# Patient Record
Sex: Male | Born: 1975 | Race: White | Hispanic: No | Smoking: Current some day smoker
Health system: Southern US, Community
[De-identification: ages and names within clinical notes are randomized; demographics above are authoritative.]

## PROBLEM LIST (undated history)

## (undated) DIAGNOSIS — R55 Syncope and collapse: Secondary | ICD-10-CM

## (undated) DIAGNOSIS — J45909 Unspecified asthma, uncomplicated: Secondary | ICD-10-CM

## (undated) DIAGNOSIS — J40 Bronchitis, not specified as acute or chronic: Secondary | ICD-10-CM

## (undated) DIAGNOSIS — F32A Depression, unspecified: Secondary | ICD-10-CM

## (undated) DIAGNOSIS — C6290 Malignant neoplasm of unspecified testis, unspecified whether descended or undescended: Secondary | ICD-10-CM

## (undated) DIAGNOSIS — C801 Malignant (primary) neoplasm, unspecified: Secondary | ICD-10-CM

## (undated) DIAGNOSIS — F419 Anxiety disorder, unspecified: Secondary | ICD-10-CM

## (undated) HISTORY — DX: Malignant (primary) neoplasm, unspecified: C80.1

## (undated) HISTORY — PX: TONSILLECTOMY: SUR1361

## (undated) HISTORY — PX: APPENDECTOMY: SHX54

## (undated) HISTORY — DX: Syncope and collapse: R55

## (undated) HISTORY — PX: ABDOMINAL SURGERY: SHX537

## (undated) HISTORY — DX: Unspecified asthma, uncomplicated: J45.909

## (undated) HISTORY — DX: Depression, unspecified: F32.A

## (undated) HISTORY — PX: OTHER SURGICAL HISTORY: SHX169

## (undated) HISTORY — DX: Anxiety disorder, unspecified: F41.9

## (undated) HISTORY — PX: CERVICAL FUSION: SHX112

---

## 2021-09-16 ENCOUNTER — Emergency Department (HOSPITAL_COMMUNITY)
Admission: EM | Admit: 2021-09-16 | Discharge: 2021-09-17 | Disposition: A | Discharge status: Home / Self Care | Payer: Self-pay | Attending: Emergency Medicine | Admitting: Emergency Medicine

## 2021-09-16 ENCOUNTER — Other Ambulatory Visit: Payer: Self-pay

## 2021-09-16 ENCOUNTER — Encounter (HOSPITAL_COMMUNITY): Payer: Self-pay

## 2021-09-16 ENCOUNTER — Emergency Department (HOSPITAL_COMMUNITY): Payer: Self-pay

## 2021-09-16 DIAGNOSIS — M7989 Other specified soft tissue disorders: Secondary | ICD-10-CM | POA: Insufficient documentation

## 2021-09-16 DIAGNOSIS — R0602 Shortness of breath: Secondary | ICD-10-CM | POA: Insufficient documentation

## 2021-09-16 DIAGNOSIS — M79671 Pain in right foot: Secondary | ICD-10-CM | POA: Insufficient documentation

## 2021-09-16 DIAGNOSIS — R0789 Other chest pain: Secondary | ICD-10-CM | POA: Insufficient documentation

## 2021-09-16 DIAGNOSIS — E876 Hypokalemia: Secondary | ICD-10-CM | POA: Insufficient documentation

## 2021-09-16 DIAGNOSIS — Z20822 Contact with and (suspected) exposure to covid-19: Secondary | ICD-10-CM | POA: Insufficient documentation

## 2021-09-16 DIAGNOSIS — M79604 Pain in right leg: Secondary | ICD-10-CM | POA: Insufficient documentation

## 2021-09-16 DIAGNOSIS — Z8547 Personal history of malignant neoplasm of testis: Secondary | ICD-10-CM | POA: Insufficient documentation

## 2021-09-16 DIAGNOSIS — F17210 Nicotine dependence, cigarettes, uncomplicated: Secondary | ICD-10-CM | POA: Insufficient documentation

## 2021-09-16 DIAGNOSIS — Z5900 Homelessness unspecified: Secondary | ICD-10-CM | POA: Insufficient documentation

## 2021-09-16 DIAGNOSIS — M79605 Pain in left leg: Secondary | ICD-10-CM | POA: Insufficient documentation

## 2021-09-16 DIAGNOSIS — S2249XA Multiple fractures of ribs, unspecified side, initial encounter for closed fracture: Secondary | ICD-10-CM

## 2021-09-16 HISTORY — DX: Malignant neoplasm of unspecified testis, unspecified whether descended or undescended: C62.90

## 2021-09-16 LAB — TROPONIN I (HIGH SENSITIVITY)
Troponin I (High Sensitivity): 5 ng/L (ref <18)
Troponin I (High Sensitivity): 6 ng/L (ref <18)

## 2021-09-16 LAB — CBC WITH DIFFERENTIAL/PLATELET
Abs Immature Granulocytes: 0.02 10*3/uL (ref 0.00–0.07)
Basophils Absolute: 0.1 10*3/uL (ref 0.0–0.1)
Basophils Relative: 1 %
Eosinophils Absolute: 0.2 10*3/uL (ref 0.0–0.5)
Eosinophils Relative: 3 %
HCT: 45.1 % (ref 39.0–52.0)
Hemoglobin: 15.5 g/dL (ref 13.0–17.0)
Immature Granulocytes: 0 %
Lymphocytes Relative: 40 %
Lymphs Abs: 3.6 10*3/uL (ref 0.7–4.0)
MCH: 30.2 pg (ref 26.0–34.0)
MCHC: 34.4 g/dL (ref 30.0–36.0)
MCV: 87.9 fL (ref 80.0–100.0)
Monocytes Absolute: 0.8 10*3/uL (ref 0.1–1.0)
Monocytes Relative: 9 %
Neutro Abs: 4.4 10*3/uL (ref 1.7–7.7)
Neutrophils Relative %: 47 %
Platelets: 246 10*3/uL (ref 150–400)
RBC: 5.13 MIL/uL (ref 4.22–5.81)
RDW: 12.9 % (ref 11.5–15.5)
WBC: 9.1 10*3/uL (ref 4.0–10.5)
nRBC: 0 % (ref 0.0–0.2)

## 2021-09-16 LAB — COMPREHENSIVE METABOLIC PANEL
ALT: 16 U/L (ref 0–44)
AST: 35 U/L (ref 15–41)
Albumin: 4 g/dL (ref 3.5–5.0)
Alkaline Phosphatase: 73 U/L (ref 38–126)
Anion gap: 10 (ref 5–15)
BUN: 10 mg/dL (ref 6–20)
CO2: 31 mmol/L (ref 22–32)
Calcium: 9.2 mg/dL (ref 8.9–10.3)
Chloride: 97 mmol/L — ABNORMAL LOW (ref 98–111)
Creatinine, Ser: 0.61 mg/dL (ref 0.61–1.24)
GFR, Estimated: >60 mL/min (ref >60)
Glucose, Bld: 92 mg/dL (ref 70–99)
Potassium: 2.9 mmol/L — ABNORMAL LOW (ref 3.5–5.1)
Sodium: 138 mmol/L (ref 135–145)
Total Bilirubin: 0.8 mg/dL (ref 0.3–1.2)
Total Protein: 7.6 g/dL (ref 6.5–8.1)

## 2021-09-16 LAB — RESP PANEL BY RT-PCR (FLU A&B, COVID) ARPGX2
Influenza A by PCR: NEGATIVE
Influenza B by PCR: NEGATIVE
SARS Coronavirus 2 by RT PCR: NEGATIVE

## 2021-09-16 LAB — BRAIN NATRIURETIC PEPTIDE: B Natriuretic Peptide: 8.8 pg/mL (ref 0.0–100.0)

## 2021-09-16 LAB — MAGNESIUM: Magnesium: 1.9 mg/dL (ref 1.7–2.4)

## 2021-09-16 LAB — D-DIMER, QUANTITATIVE: D-Dimer, Quant: 0.7 ug/mL-FEU — ABNORMAL HIGH (ref 0.00–0.50)

## 2021-09-16 MED ORDER — MAGNESIUM OXIDE -MG SUPPLEMENT 400 (240 MG) MG PO TABS
400.0000 mg | ORAL_TABLET | Freq: Once | ORAL | Status: AC
  Administered 2021-09-16: 400 mg via ORAL
  Filled 2021-09-16: qty 1

## 2021-09-16 MED ORDER — POTASSIUM CHLORIDE 10 MEQ/100ML IV SOLN
10.0000 meq | INTRAVENOUS | Status: AC
Start: 2021-09-16 — End: 2021-09-17
  Administered 2021-09-16 – 2021-09-17 (×3): 10 meq via INTRAVENOUS
  Filled 2021-09-16 (×3): qty 100

## 2021-09-16 MED ORDER — KETOROLAC TROMETHAMINE 15 MG/ML IJ SOLN
15.0000 mg | Freq: Once | INTRAMUSCULAR | Status: AC
  Administered 2021-09-16: 15 mg via INTRAVENOUS
  Filled 2021-09-16: qty 1

## 2021-09-16 MED ORDER — SODIUM CHLORIDE 0.9 % IV SOLN: INTRAVENOUS | Status: AC

## 2021-09-16 MED ORDER — HYDROCODONE-ACETAMINOPHEN 5-325 MG PO TABS
1.0000 | ORAL_TABLET | Freq: Once | ORAL | Status: AC
  Administered 2021-09-16: 1 via ORAL
  Filled 2021-09-16: qty 1
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
## 2021-09-16 NOTE — ED Triage Notes (Signed)
Pt reports swelling to bilateral legs and  sob for the past week. Pt states he has noticed some discoloration/bruising to right foot. Denies injury. Lateral side of right foot and a toe does appear blue/black in color.

## 2021-09-16 NOTE — ED Provider Notes (Signed)
Emergency Medicine Provider Triage Evaluation Note  Joshua Cooper , a 45 y.o. male  was evaluated in triage.  Pt complains of bilateral leg swelling x1 week.  Reports he noticed discoloration to the right foot, he is not having any chest pain.  He does endorse feeling short of breath, but states that is chronic for him and he is a smoker.  History of testicular cancer, status post left orchiectomy..  Review of Systems  Positive: Bilateral leg swelling, shortness of breath Negative: Chest pain  Physical Exam  There were no vitals taken for this visit. Gen:   Awake, no distress   Resp:  Normal effort  MSK:   Moves extremities without difficulty  Other:  Pitting edema bilaterally.  DP and PT 2+ bilaterally legs are roughly symmetric    Medical Decision Making  Medically screening exam initiated at 4:03 PM.  Appropriate orders placed.  Darren Kemp was informed that the remainder of the evaluation will be completed by another provider, this initial triage assessment does not replace that evaluation, and the importance of remaining in the ED until their evaluation is complete.  Will order lab work evaluation, doubt DVT given that symmetric edema without any calf pain or claudication   Sherrill Raring, Hershal Coria 09/16/21 1605    Milton Ferguson, MD 09/17/21 1725

## 2021-09-17 ENCOUNTER — Encounter (HOSPITAL_COMMUNITY): Payer: Self-pay

## 2021-09-17 ENCOUNTER — Emergency Department (HOSPITAL_COMMUNITY): Payer: Self-pay

## 2021-09-17 MED ORDER — POTASSIUM CHLORIDE CRYS ER 20 MEQ PO TBCR
40.0000 meq | EXTENDED_RELEASE_TABLET | Freq: Once | ORAL | Status: AC
  Administered 2021-09-17: 40 meq via ORAL
  Filled 2021-09-17: qty 2

## 2021-09-17 MED ORDER — LIDOCAINE 5 % EX PTCH: 1.0000 | MEDICATED_PATCH | Freq: Every day | CUTANEOUS | Status: DC | PRN

## 2021-09-17 MED ORDER — IOHEXOL 350 MG/ML SOLN
80.0000 mL | Freq: Once | INTRAVENOUS | Status: AC | PRN
  Administered 2021-09-17: 80 mL via INTRAVENOUS

## 2021-09-17 MED ORDER — VENLAFAXINE HCL ER 75 MG PO CP24
75.0000 mg | ORAL_CAPSULE | Freq: Once | ORAL | Status: AC
  Administered 2021-09-17: 75 mg via ORAL
  Filled 2021-09-17: qty 1

## 2021-09-17 MED ORDER — FENTANYL CITRATE PF 50 MCG/ML IJ SOSY
100.0000 ug | PREFILLED_SYRINGE | INTRAMUSCULAR | Status: DC | PRN
Start: 2021-09-17 — End: 2021-09-17
  Administered 2021-09-17 (×2): 100 ug via INTRAVENOUS
  Filled 2021-09-17 (×2): qty 2

## 2021-09-17 MED ORDER — IBUPROFEN 400 MG PO TABS: 400.0000 mg | ORAL_TABLET | Freq: Four times a day (QID) | ORAL | 0 refills | Status: DC | PRN

## 2021-09-17 MED ORDER — METHOCARBAMOL 500 MG PO TABS
1000.0000 mg | ORAL_TABLET | Freq: Once | ORAL | Status: AC
  Administered 2021-09-17: 1000 mg via ORAL
  Filled 2021-09-17: qty 2

## 2021-09-17 NOTE — Discharge Planning (Signed)
RNCM consulted regarding homeless, uninsured pt needing medication assistance.  RNCM advises to have pt go to [facility] for a one time free Rx fill and to register with [facility]) medical team for consistent medical care and Rx needs.

## 2021-09-17 NOTE — Progress Notes (Signed)
Shelter resources have been attached to the AVS

## 2021-09-17 NOTE — ED Provider Notes (Signed)
Nursing notes and vitals signs, including pulse oximetry, reviewed.  Summary of this visit's results, reviewed by myself:  EKG:  EKG Interpretation  Date/Time:    Ventricular Rate:    PR Interval:    QRS Duration:   QT Interval:    QTC Calculation:   R Axis:     Text Interpretation:          Labs:  Results for orders placed or performed during the hospital encounter of 09/16/21 (from the past 24 hour(s))  Brain natriuretic peptide     Status: None   Collection Time: 09/16/21  4:32 PM  Result Value Ref Range   B Natriuretic Peptide 8.8 0.0 - 100.0 pg/mL  Comprehensive metabolic panel     Status: Abnormal   Collection Time: 09/16/21  4:32 PM  Result Value Ref Range   Sodium 138 135 - 145 mmol/L   Potassium 2.9 (L) 3.5 - 5.1 mmol/L   Chloride 97 (L) 98 - 111 mmol/L   CO2 31 22 - 32 mmol/L   Glucose, Bld 92 70 - 99 mg/dL   BUN 10 6 - 20 mg/dL   Creatinine, Ser 0.61 0.61 - 1.24 mg/dL   Calcium 9.2 8.9 - 10.3 mg/dL   Total Protein 7.6 6.5 - 8.1 g/dL   Albumin 4.0 3.5 - 5.0 g/dL   AST 35 15 - 41 U/L   ALT 16 0 - 44 U/L   Alkaline Phosphatase 73 38 - 126 U/L   Total Bilirubin 0.8 0.3 - 1.2 mg/dL   GFR, Estimated >60 >60 mL/min   Anion gap 10 5 - 15  CBC with Differential     Status: None   Collection Time: 09/16/21  4:32 PM  Result Value Ref Range   WBC 9.1 4.0 - 10.5 K/uL   RBC 5.13 4.22 - 5.81 MIL/uL   Hemoglobin 15.5 13.0 - 17.0 g/dL   HCT 45.1 39.0 - 52.0 %   MCV 87.9 80.0 - 100.0 fL   MCH 30.2 26.0 - 34.0 pg   MCHC 34.4 30.0 - 36.0 g/dL   RDW 12.9 11.5 - 15.5 %   Platelets 246 150 - 400 K/uL   nRBC 0.0 0.0 - 0.2 %   Neutrophils Relative % 47 %   Neutro Abs 4.4 1.7 - 7.7 K/uL   Lymphocytes Relative 40 %   Lymphs Abs 3.6 0.7 - 4.0 K/uL   Monocytes Relative 9 %   Monocytes Absolute 0.8 0.1 - 1.0 K/uL   Eosinophils Relative 3 %   Eosinophils Absolute 0.2 0.0 - 0.5 K/uL   Basophils Relative 1 %   Basophils Absolute 0.1 0.0 - 0.1 K/uL   Immature Granulocytes  0 %   Abs Immature Granulocytes 0.02 0.00 - 0.07 K/uL  Troponin I (High Sensitivity)     Status: None   Collection Time: 09/16/21  9:44 PM  Result Value Ref Range   Troponin I (High Sensitivity) 5 <18 ng/L  Magnesium     Status: None   Collection Time: 09/16/21  9:45 PM  Result Value Ref Range   Magnesium 1.9 1.7 - 2.4 mg/dL  Troponin I (High Sensitivity)     Status: None   Collection Time: 09/16/21  9:45 PM  Result Value Ref Range   Troponin I (High Sensitivity) 6 <18 ng/L  D-dimer, quantitative     Status: Abnormal   Collection Time: 09/16/21  9:45 PM  Result Value Ref Range   D-Dimer, Quant 0.70 (H) 0.00 - 0.50 ug/mL-FEU  Resp Panel by RT-PCR (Flu A&B, Covid) Nasopharyngeal Swab     Status: None   Collection Time: 09/16/21  9:53 PM   Specimen: Nasopharyngeal Swab; Nasopharyngeal(NP) swabs in vial transport medium  Result Value Ref Range   SARS Coronavirus 2 by RT PCR NEGATIVE NEGATIVE   Influenza A by PCR NEGATIVE NEGATIVE   Influenza B by PCR NEGATIVE NEGATIVE    Imaging Studies: DG Chest 2 View  Result Date: 09/16/2021 CLINICAL DATA:  Shortness of breath, leg swelling EXAM: CHEST - 2 VIEW COMPARISON:  None. FINDINGS: The cardiomediastinal silhouette is normal. The lungs are clear, with no focal consolidation or pulmonary edema. There is no pleural effusion or pneumothorax. There is a subacute appearing fracture of the left posterior ninth rib. Cervical spine fusion hardware is partially imaged. IMPRESSION: 1. No radiographic evidence of acute cardiopulmonary process. 2. Subacute appearing fracture of the left ninth rib. Correlate with point tenderness. Electronically Signed   By: Valetta Mole M.D.   On: 09/16/2021 16:35   DG Ribs Unilateral Right  Result Date: 09/16/2021 CLINICAL DATA:  Ninth and tenth rib fractures. EXAM: RIGHT RIBS - 2 VIEW COMPARISON:  Chest 09/16/2021 FINDINGS: Right ribs appear intact. No acute displaced fractures are identified. No focal bone lesion or  bone destruction. Incidental note of fractures of the left ninth, tenth, and eleventh ribs, incompletely visualized on the oblique views. IMPRESSION: 1. Negative right ribs. 2. Incidental visualization of fractures of the left ninth, tenth, and eleventh ribs. Electronically Signed   By: Lucienne Capers M.D.   On: 09/16/2021 22:39   CT Angio Chest PE W and/or Wo Contrast  Result Date: 09/17/2021 CLINICAL DATA:  PE suspected, low/intermediate prob, positive D-dimer. History of testicle cancer, chest pain; Abdominal trauma EXAM: CT ANGIOGRAPHY CHEST CT ABDOMEN AND PELVIS WITH CONTRAST TECHNIQUE: Multidetector CT imaging of the chest was performed using the standard protocol during bolus administration of intravenous contrast. Multiplanar CT image reconstructions and MIPs were obtained to evaluate the vascular anatomy. Multidetector CT imaging of the abdomen and pelvis was performed using the standard protocol during bolus administration of intravenous contrast. CONTRAST:  15m OMNIPAQUE IOHEXOL 350 MG/ML SOLN COMPARISON:  None. FINDINGS: CTA CHEST FINDINGS Cardiovascular: There is adequate opacification of the pulmonary arterial tree. There is a single eccentric branching intraluminal filling defect identified within the segmental pulmonary arteries of the lingula, best seen on axial image # 43/3 and sagittal image # 136/7 compatible with a probable chronic pulmonary embolus. No definite acute pulmonary embolism identified. The central pulmonary arteries are of normal caliber. Cardiac size within normal limits. No significant coronary artery calcification. No pericardial effusion. The thoracic aorta is unremarkable. Mediastinum/Nodes: No enlarged mediastinal, hilar, or axillary lymph nodes. Thyroid gland, trachea, and esophagus demonstrate no significant findings. Lungs/Pleura: Mild biapical paraseptal and centrilobular emphysema. There is diffuse bronchial wall thickening in keeping with diffuse airway  inflammation. Dependently layering debris within the right mainstem bronchus likely represents a small amount of intraluminal secretion. Mild bibasilar dependent atelectasis is present. There is mild dependent subpleural reticulonodular infiltrate best appreciated within the lung apices which may reflect post inflammatory changes. No confluent pulmonary infiltrate. No pneumothorax or pleural effusion. Central airways are widely patent. Musculoskeletal: No acute bone abnormality. No lytic or blastic bone lesion. Review of the MIP images confirms the above findings. CT ABDOMEN and PELVIS FINDINGS Hepatobiliary: Mild hepatic steatosis. No focal intrahepatic mass. No intra or extrahepatic biliary ductal dilation. Gallbladder unremarkable. Pancreas: Unremarkable Spleen: Unremarkable Adrenals/Urinary Tract: The adrenal  glands are unremarkable. The kidneys are normal in size and position. Tiny cortical hypodensities are seen within the right kidney, the largest of which is compatible with a simple cortical cyst within the upper pole. Indeterminate hyperdense 9 mm lesion arises exophytically from the upper pole of the left kidney, indeterminate. No hydronephrosis. No intrarenal or ureteral calculi. The bladder is unremarkable. Stomach/Bowel: There is diffuse submucosal fatty infiltration of the colon, a finding that can be seen as the sequela of remote or recurrent inflammation. No acute inflammatory changes are identified, however. The stomach, small bowel, and large bowel are otherwise unremarkable. Appendix normal. No free intraperitoneal gas or fluid. Vascular/Lymphatic: Mild aortoiliac atherosclerotic calcification. No aortic aneurysm. No pathologic adenopathy within the abdomen and pelvis. Reproductive: The prostate gland is not enlarged. Left orchiectomy and testicular prosthetic are partially visualized. Other: Right inguinal cord lipoma. No abdominal wall hernia. Rectum unremarkable. Musculoskeletal: No acute bone  abnormality. Benign intraosseous lipoma noted within the left iliac spine. No suspicious lytic or blastic bone lesion. Review of the MIP images confirms the above findings. IMPRESSION: No acute pulmonary embolism. Tiny chronic pulmonary embolus noted within the segmental pulmonary arteries of the lingula. Mild emphysema. Diffuse bronchial wall thickening in keeping with airway inflammation, as can be seen with COPD. Minimal subpleural reticulonodular infiltrate within the lung apices may be post inflammatory in nature. No acute intra-abdominal pathology identified. Mild hepatic steatosis. Diffuse fatty submucosal infiltration of the colon, possibly the sequela of remote or recurrent inflammation. No acute inflammatory changes identified, however. Of left orchiectomy Surgical changes. No evidence of metastatic disease within the chest, abdomen, and pelvis. Aortic Atherosclerosis (ICD10-I70.0). Electronically Signed   By: Fidela Salisbury M.D.   On: 09/17/2021 03:12   CT ABDOMEN PELVIS W CONTRAST  Result Date: 09/17/2021 CLINICAL DATA:  PE suspected, low/intermediate prob, positive D-dimer. History of testicle cancer, chest pain; Abdominal trauma EXAM: CT ANGIOGRAPHY CHEST CT ABDOMEN AND PELVIS WITH CONTRAST TECHNIQUE: Multidetector CT imaging of the chest was performed using the standard protocol during bolus administration of intravenous contrast. Multiplanar CT image reconstructions and MIPs were obtained to evaluate the vascular anatomy. Multidetector CT imaging of the abdomen and pelvis was performed using the standard protocol during bolus administration of intravenous contrast. CONTRAST:  56m OMNIPAQUE IOHEXOL 350 MG/ML SOLN COMPARISON:  None. FINDINGS: CTA CHEST FINDINGS Cardiovascular: There is adequate opacification of the pulmonary arterial tree. There is a single eccentric branching intraluminal filling defect identified within the segmental pulmonary arteries of the lingula, best seen on axial image #  43/3 and sagittal image # 136/7 compatible with a probable chronic pulmonary embolus. No definite acute pulmonary embolism identified. The central pulmonary arteries are of normal caliber. Cardiac size within normal limits. No significant coronary artery calcification. No pericardial effusion. The thoracic aorta is unremarkable. Mediastinum/Nodes: No enlarged mediastinal, hilar, or axillary lymph nodes. Thyroid gland, trachea, and esophagus demonstrate no significant findings. Lungs/Pleura: Mild biapical paraseptal and centrilobular emphysema. There is diffuse bronchial wall thickening in keeping with diffuse airway inflammation. Dependently layering debris within the right mainstem bronchus likely represents a small amount of intraluminal secretion. Mild bibasilar dependent atelectasis is present. There is mild dependent subpleural reticulonodular infiltrate best appreciated within the lung apices which may reflect post inflammatory changes. No confluent pulmonary infiltrate. No pneumothorax or pleural effusion. Central airways are widely patent. Musculoskeletal: No acute bone abnormality. No lytic or blastic bone lesion. Review of the MIP images confirms the above findings. CT ABDOMEN and PELVIS FINDINGS Hepatobiliary: Mild hepatic  steatosis. No focal intrahepatic mass. No intra or extrahepatic biliary ductal dilation. Gallbladder unremarkable. Pancreas: Unremarkable Spleen: Unremarkable Adrenals/Urinary Tract: The adrenal glands are unremarkable. The kidneys are normal in size and position. Tiny cortical hypodensities are seen within the right kidney, the largest of which is compatible with a simple cortical cyst within the upper pole. Indeterminate hyperdense 9 mm lesion arises exophytically from the upper pole of the left kidney, indeterminate. No hydronephrosis. No intrarenal or ureteral calculi. The bladder is unremarkable. Stomach/Bowel: There is diffuse submucosal fatty infiltration of the colon, a finding  that can be seen as the sequela of remote or recurrent inflammation. No acute inflammatory changes are identified, however. The stomach, small bowel, and large bowel are otherwise unremarkable. Appendix normal. No free intraperitoneal gas or fluid. Vascular/Lymphatic: Mild aortoiliac atherosclerotic calcification. No aortic aneurysm. No pathologic adenopathy within the abdomen and pelvis. Reproductive: The prostate gland is not enlarged. Left orchiectomy and testicular prosthetic are partially visualized. Other: Right inguinal cord lipoma. No abdominal wall hernia. Rectum unremarkable. Musculoskeletal: No acute bone abnormality. Benign intraosseous lipoma noted within the left iliac spine. No suspicious lytic or blastic bone lesion. Review of the MIP images confirms the above findings. IMPRESSION: No acute pulmonary embolism. Tiny chronic pulmonary embolus noted within the segmental pulmonary arteries of the lingula. Mild emphysema. Diffuse bronchial wall thickening in keeping with airway inflammation, as can be seen with COPD. Minimal subpleural reticulonodular infiltrate within the lung apices may be post inflammatory in nature. No acute intra-abdominal pathology identified. Mild hepatic steatosis. Diffuse fatty submucosal infiltration of the colon, possibly the sequela of remote or recurrent inflammation. No acute inflammatory changes identified, however. Of left orchiectomy Surgical changes. No evidence of metastatic disease within the chest, abdomen, and pelvis. Aortic Atherosclerosis (ICD10-I70.0). Electronically Signed   By: Fidela Salisbury M.D.   On: 09/17/2021 03:12   DG Foot Complete Right  Result Date: 09/16/2021 CLINICAL DATA:  Foot discoloration and pain. EXAM: RIGHT FOOT COMPLETE - 3+ VIEW COMPARISON:  None. FINDINGS: There is no evidence of fracture or dislocation. There is no evidence of arthropathy or other focal bone abnormality. Soft tissues are unremarkable. IMPRESSION: Negative.  Electronically Signed   By: Ronney Asters M.D.   On: 09/16/2021 20:15    Although the patient is tender in his right lateral ribs no rib fractures were seen on the right on plain films or CT.  Subacute fractures were seen on the left.      Delray Reza, Jenny Reichmann, MD 09/17/21 [phone]

## 2021-09-17 NOTE — ED Notes (Signed)
Patient c/o intense sharp pain in mid abdomen. Patient appears to be in distress. MD notified and at bedside

## 2021-10-04 ENCOUNTER — Encounter (HOSPITAL_COMMUNITY): Admission: EM | Disposition: A | Discharge status: Home / Self Care | Payer: Self-pay | Source: Home / Self Care

## 2021-10-04 ENCOUNTER — Encounter (HOSPITAL_COMMUNITY): Payer: Self-pay

## 2021-10-04 ENCOUNTER — Observation Stay (HOSPITAL_COMMUNITY): Payer: Self-pay | Admitting: Certified Registered Nurse Anesthetist

## 2021-10-04 ENCOUNTER — Inpatient Hospital Stay (HOSPITAL_COMMUNITY)
Admission: EM | Admit: 2021-10-04 | Discharge: 2021-10-18 | DRG: 339 | Disposition: A | Discharge status: Home / Self Care | Payer: Self-pay | Attending: General Surgery | Admitting: General Surgery

## 2021-10-04 ENCOUNTER — Emergency Department (HOSPITAL_COMMUNITY): Payer: Self-pay

## 2021-10-04 ENCOUNTER — Other Ambulatory Visit: Payer: Self-pay

## 2021-10-04 DIAGNOSIS — K567 Ileus, unspecified: Secondary | ICD-10-CM | POA: Diagnosis not present

## 2021-10-04 DIAGNOSIS — F32A Depression, unspecified: Secondary | ICD-10-CM | POA: Diagnosis present

## 2021-10-04 DIAGNOSIS — K3580 Unspecified acute appendicitis: Secondary | ICD-10-CM | POA: Diagnosis present

## 2021-10-04 DIAGNOSIS — D649 Anemia, unspecified: Secondary | ICD-10-CM

## 2021-10-04 DIAGNOSIS — Z9079 Acquired absence of other genital organ(s): Secondary | ICD-10-CM

## 2021-10-04 DIAGNOSIS — K3521 Acute appendicitis with generalized peritonitis, with abscess: Principal | ICD-10-CM | POA: Diagnosis present

## 2021-10-04 DIAGNOSIS — D6489 Other specified anemias: Secondary | ICD-10-CM | POA: Diagnosis present

## 2021-10-04 DIAGNOSIS — D72829 Elevated white blood cell count, unspecified: Secondary | ICD-10-CM

## 2021-10-04 DIAGNOSIS — L02211 Cutaneous abscess of abdominal wall: Secondary | ICD-10-CM | POA: Diagnosis present

## 2021-10-04 DIAGNOSIS — N289 Disorder of kidney and ureter, unspecified: Secondary | ICD-10-CM | POA: Diagnosis present

## 2021-10-04 DIAGNOSIS — E876 Hypokalemia: Secondary | ICD-10-CM

## 2021-10-04 DIAGNOSIS — K3532 Acute appendicitis with perforation and localized peritonitis, without abscess: Secondary | ICD-10-CM | POA: Diagnosis present

## 2021-10-04 DIAGNOSIS — Z4659 Encounter for fitting and adjustment of other gastrointestinal appliance and device: Secondary | ICD-10-CM

## 2021-10-04 DIAGNOSIS — K35209 Acute appendicitis with generalized peritonitis, without abscess, unspecified as to perforation: Secondary | ICD-10-CM

## 2021-10-04 DIAGNOSIS — K219 Gastro-esophageal reflux disease without esophagitis: Secondary | ICD-10-CM | POA: Diagnosis present

## 2021-10-04 DIAGNOSIS — K352 Acute appendicitis with generalized peritonitis, without abscess: Secondary | ICD-10-CM

## 2021-10-04 DIAGNOSIS — F17210 Nicotine dependence, cigarettes, uncomplicated: Secondary | ICD-10-CM | POA: Diagnosis present

## 2021-10-04 DIAGNOSIS — R188 Other ascites: Secondary | ICD-10-CM | POA: Diagnosis present

## 2021-10-04 DIAGNOSIS — Z8547 Personal history of malignant neoplasm of testis: Secondary | ICD-10-CM

## 2021-10-04 DIAGNOSIS — Z20822 Contact with and (suspected) exposure to covid-19: Secondary | ICD-10-CM | POA: Diagnosis present

## 2021-10-04 DIAGNOSIS — K651 Peritoneal abscess: Secondary | ICD-10-CM

## 2021-10-04 DIAGNOSIS — T8143XA Infection following a procedure, organ and space surgical site, initial encounter: Secondary | ICD-10-CM

## 2021-10-04 DIAGNOSIS — Z5900 Homelessness unspecified: Secondary | ICD-10-CM

## 2021-10-04 DIAGNOSIS — K56609 Unspecified intestinal obstruction, unspecified as to partial versus complete obstruction: Secondary | ICD-10-CM | POA: Diagnosis not present

## 2021-10-04 HISTORY — PX: LAPAROSCOPIC APPENDECTOMY: SHX408

## 2021-10-04 LAB — COMPREHENSIVE METABOLIC PANEL
ALT: 14 U/L (ref 0–44)
AST: 22 U/L (ref 15–41)
Albumin: 3.5 g/dL (ref 3.5–5.0)
Alkaline Phosphatase: 64 U/L (ref 38–126)
Anion gap: 8 (ref 5–15)
BUN: 11 mg/dL (ref 6–20)
CO2: 25 mmol/L (ref 22–32)
Calcium: 9.3 mg/dL (ref 8.9–10.3)
Chloride: 98 mmol/L (ref 98–111)
Creatinine, Ser: 0.6 mg/dL — ABNORMAL LOW (ref 0.61–1.24)
GFR, Estimated: >60 mL/min (ref >60)
Glucose, Bld: 126 mg/dL — ABNORMAL HIGH (ref 70–99)
Potassium: 3 mmol/L — ABNORMAL LOW (ref 3.5–5.1)
Sodium: 131 mmol/L — ABNORMAL LOW (ref 135–145)
Total Bilirubin: 1.7 mg/dL — ABNORMAL HIGH (ref 0.3–1.2)
Total Protein: 6.7 g/dL (ref 6.5–8.1)

## 2021-10-04 LAB — URINALYSIS, ROUTINE W REFLEX MICROSCOPIC
Bilirubin Urine: NEGATIVE
Glucose, UA: NEGATIVE mg/dL
Ketones, ur: NEGATIVE mg/dL
Leukocytes,Ua: NEGATIVE
Nitrite: NEGATIVE
Protein, ur: NEGATIVE mg/dL
Specific Gravity, Urine: >1.046 — ABNORMAL HIGH (ref 1.005–1.030)
pH: 6 (ref 5.0–8.0)

## 2021-10-04 LAB — CBC WITH DIFFERENTIAL/PLATELET
Abs Immature Granulocytes: 0.15 10*3/uL — ABNORMAL HIGH (ref 0.00–0.07)
Basophils Absolute: 0.1 10*3/uL (ref 0.0–0.1)
Basophils Relative: 0 %
Eosinophils Absolute: 0 10*3/uL (ref 0.0–0.5)
Eosinophils Relative: 0 %
HCT: 30.9 % — ABNORMAL LOW (ref 39.0–52.0)
Hemoglobin: 10.8 g/dL — ABNORMAL LOW (ref 13.0–17.0)
Immature Granulocytes: 1 %
Lymphocytes Relative: 8 %
Lymphs Abs: 1.7 10*3/uL (ref 0.7–4.0)
MCH: 31 pg (ref 26.0–34.0)
MCHC: 35 g/dL (ref 30.0–36.0)
MCV: 88.8 fL (ref 80.0–100.0)
Monocytes Absolute: 1.4 10*3/uL — ABNORMAL HIGH (ref 0.1–1.0)
Monocytes Relative: 6 %
Neutro Abs: 18.7 10*3/uL — ABNORMAL HIGH (ref 1.7–7.7)
Neutrophils Relative %: 85 %
Platelets: 269 10*3/uL (ref 150–400)
RBC: 3.48 MIL/uL — ABNORMAL LOW (ref 4.22–5.81)
RDW: 13 % (ref 11.5–15.5)
WBC: 22 10*3/uL — ABNORMAL HIGH (ref 4.0–10.5)
nRBC: 0 % (ref 0.0–0.2)

## 2021-10-04 LAB — RESP PANEL BY RT-PCR (FLU A&B, COVID) ARPGX2
Influenza A by PCR: NEGATIVE
Influenza B by PCR: NEGATIVE
SARS Coronavirus 2 by RT PCR: NEGATIVE

## 2021-10-04 LAB — TYPE AND SCREEN
ABO/RH(D): A POS
Antibody Screen: NEGATIVE

## 2021-10-04 LAB — HIV ANTIBODY (ROUTINE TESTING W REFLEX): HIV Screen 4th Generation wRfx: NONREACTIVE

## 2021-10-04 LAB — LACTIC ACID, PLASMA: Lactic Acid, Venous: 1.1 mmol/L (ref 0.5–1.9)

## 2021-10-04 LAB — ABO/RH: ABO/RH(D): A POS

## 2021-10-04 LAB — LIPASE, BLOOD: Lipase: 24 U/L (ref 11–51)

## 2021-10-04 SURGERY — APPENDECTOMY, LAPAROSCOPIC
Anesthesia: General | Site: Abdomen

## 2021-10-04 MED ORDER — FENTANYL CITRATE (PF) 250 MCG/5ML IJ SOLN
INTRAMUSCULAR | Status: DC | PRN
  Administered 2021-10-04: 100 ug via INTRAVENOUS

## 2021-10-04 MED ORDER — PROPOFOL 10 MG/ML IV BOLUS
INTRAVENOUS | Status: AC
  Filled 2021-10-04: qty 20

## 2021-10-04 MED ORDER — ENOXAPARIN SODIUM 40 MG/0.4ML IJ SOSY
40.0000 mg | PREFILLED_SYRINGE | INTRAMUSCULAR | Status: DC
  Administered 2021-10-05 – 2021-10-18 (×13): 40 mg via SUBCUTANEOUS
  Filled 2021-10-04 (×13): qty 0.4

## 2021-10-04 MED ORDER — FENTANYL CITRATE (PF) 100 MCG/2ML IJ SOLN
INTRAMUSCULAR | Status: AC
  Filled 2021-10-04: qty 2

## 2021-10-04 MED ORDER — MELATONIN 3 MG PO TABS
3.0000 mg | ORAL_TABLET | Freq: Every evening | ORAL | Status: DC | PRN
  Administered 2021-10-11 – 2021-10-17 (×3): 3 mg via ORAL
  Filled 2021-10-04 (×3): qty 1

## 2021-10-04 MED ORDER — LIDOCAINE 2% (20 MG/ML) 5 ML SYRINGE
INTRAMUSCULAR | Status: DC | PRN
Start: 2021-10-04 — End: 2021-10-04
  Administered 2021-10-04: 80 mg via INTRAVENOUS

## 2021-10-04 MED ORDER — OXYCODONE HCL 5 MG PO TABS
5.0000 mg | ORAL_TABLET | ORAL | Status: DC | PRN
  Administered 2021-10-04: 10 mg via ORAL
  Administered 2021-10-04: 5 mg via ORAL
  Administered 2021-10-05 – 2021-10-06 (×6): 10 mg via ORAL
  Filled 2021-10-04: qty 1
  Filled 2021-10-04 (×7): qty 2

## 2021-10-04 MED ORDER — SODIUM CHLORIDE 0.9 % IV SOLN: INTRAVENOUS | Status: DC

## 2021-10-04 MED ORDER — DOCUSATE SODIUM 100 MG PO CAPS
100.0000 mg | ORAL_CAPSULE | Freq: Two times a day (BID) | ORAL | Status: DC
  Administered 2021-10-04 – 2021-10-06 (×4): 100 mg via ORAL
  Filled 2021-10-04 (×4): qty 1

## 2021-10-04 MED ORDER — ACETAMINOPHEN 10 MG/ML IV SOLN: 1000.0000 mg | Freq: Once | INTRAVENOUS | Status: DC | PRN

## 2021-10-04 MED ORDER — LACTATED RINGERS IV SOLN: INTRAVENOUS | Status: DC | PRN

## 2021-10-04 MED ORDER — MIDAZOLAM HCL 2 MG/2ML IJ SOLN
INTRAMUSCULAR | Status: DC | PRN
  Administered 2021-10-04: 2 mg via INTRAVENOUS

## 2021-10-04 MED ORDER — SODIUM CHLORIDE 0.9 % IV BOLUS
1000.0000 mL | Freq: Once | INTRAVENOUS | Status: AC
Start: 2021-10-04 — End: 2021-10-04
  Administered 2021-10-04: 1000 mL via INTRAVENOUS

## 2021-10-04 MED ORDER — BUPIVACAINE-EPINEPHRINE 0.25% -1:200000 IJ SOLN
INTRAMUSCULAR | Status: AC
  Filled 2021-10-04: qty 1

## 2021-10-04 MED ORDER — ONDANSETRON HCL 4 MG/2ML IJ SOLN
4.0000 mg | Freq: Four times a day (QID) | INTRAMUSCULAR | Status: DC | PRN
  Administered 2021-10-04: 4 mg via INTRAVENOUS

## 2021-10-04 MED ORDER — FENTANYL CITRATE PF 50 MCG/ML IJ SOSY: 25.0000 ug | PREFILLED_SYRINGE | INTRAMUSCULAR | Status: DC | PRN

## 2021-10-04 MED ORDER — ROCURONIUM BROMIDE 10 MG/ML (PF) SYRINGE
PREFILLED_SYRINGE | INTRAVENOUS | Status: AC
  Filled 2021-10-04: qty 10

## 2021-10-04 MED ORDER — LACTATED RINGERS IR SOLN
Status: DC | PRN
  Administered 2021-10-04: 2000 mL

## 2021-10-04 MED ORDER — LIDOCAINE HCL (PF) 2 % IJ SOLN
INTRAMUSCULAR | Status: AC
  Filled 2021-10-04: qty 35

## 2021-10-04 MED ORDER — DEXTROSE 5 % IV SOLN
INTRAVENOUS | Status: DC | PRN
  Administered 2021-10-04: 2 g via INTRAVENOUS

## 2021-10-04 MED ORDER — METHOCARBAMOL 500 MG PO TABS
500.0000 mg | ORAL_TABLET | Freq: Four times a day (QID) | ORAL | Status: DC | PRN
Start: 2021-10-04 — End: 2021-10-06
  Administered 2021-10-05 – 2021-10-06 (×2): 500 mg via ORAL
  Filled 2021-10-04 (×3): qty 1

## 2021-10-04 MED ORDER — IOHEXOL 350 MG/ML SOLN
80.0000 mL | Freq: Once | INTRAVENOUS | Status: AC | PRN
  Administered 2021-10-04: 80 mL via INTRAVENOUS

## 2021-10-04 MED ORDER — DEXAMETHASONE SODIUM PHOSPHATE 10 MG/ML IJ SOLN
INTRAMUSCULAR | Status: DC | PRN
  Administered 2021-10-04: 10 mg via INTRAVENOUS

## 2021-10-04 MED ORDER — DIPHENHYDRAMINE HCL 25 MG PO CAPS
25.0000 mg | ORAL_CAPSULE | Freq: Four times a day (QID) | ORAL | Status: DC | PRN
  Administered 2021-10-14: 25 mg via ORAL
  Filled 2021-10-04 (×2): qty 1

## 2021-10-04 MED ORDER — PROMETHAZINE HCL 25 MG/ML IJ SOLN: 6.2500 mg | INTRAMUSCULAR | Status: DC | PRN

## 2021-10-04 MED ORDER — DEXAMETHASONE SODIUM PHOSPHATE 10 MG/ML IJ SOLN
INTRAMUSCULAR | Status: AC
  Filled 2021-10-04: qty 1

## 2021-10-04 MED ORDER — PROPOFOL 10 MG/ML IV BOLUS
INTRAVENOUS | Status: DC | PRN
  Administered 2021-10-04: 160 mg via INTRAVENOUS

## 2021-10-04 MED ORDER — VENLAFAXINE HCL ER 150 MG PO CP24
150.0000 mg | ORAL_CAPSULE | Freq: Every evening | ORAL | Status: DC
  Administered 2021-10-04 – 2021-10-17 (×13): 150 mg via ORAL
  Filled 2021-10-04 (×14): qty 1

## 2021-10-04 MED ORDER — SUCCINYLCHOLINE CHLORIDE 200 MG/10ML IV SOSY
PREFILLED_SYRINGE | INTRAVENOUS | Status: DC | PRN
  Administered 2021-10-04: 120 mg via INTRAVENOUS

## 2021-10-04 MED ORDER — SODIUM CHLORIDE 0.9 % IV SOLN
2.0000 g | Freq: Every day | INTRAVENOUS | Status: DC
  Administered 2021-10-05: 2 g via INTRAVENOUS
  Filled 2021-10-04 (×3): qty 20

## 2021-10-04 MED ORDER — HYDROMORPHONE HCL 1 MG/ML IJ SOLN
1.0000 mg | Freq: Once | INTRAMUSCULAR | Status: AC
Start: 2021-10-04 — End: 2021-10-04
  Administered 2021-10-04: 1 mg via INTRAVENOUS
  Filled 2021-10-04: qty 1

## 2021-10-04 MED ORDER — POLYETHYLENE GLYCOL 3350 17 G PO PACK
17.0000 g | PACK | Freq: Every day | ORAL | Status: DC | PRN
  Administered 2021-10-05: 17 g via ORAL
  Filled 2021-10-04: qty 1

## 2021-10-04 MED ORDER — FENTANYL CITRATE PF 50 MCG/ML IJ SOSY
PREFILLED_SYRINGE | INTRAMUSCULAR | Status: AC
  Filled 2021-10-04: qty 3

## 2021-10-04 MED ORDER — ONDANSETRON 4 MG PO TBDP: 4.0000 mg | ORAL_TABLET | Freq: Four times a day (QID) | ORAL | Status: DC | PRN

## 2021-10-04 MED ORDER — BUPIVACAINE-EPINEPHRINE (PF) 0.25% -1:200000 IJ SOLN
INTRAMUSCULAR | Status: DC | PRN
  Administered 2021-10-04: 30 mL

## 2021-10-04 MED ORDER — ONDANSETRON HCL 4 MG/2ML IJ SOLN
4.0000 mg | Freq: Once | INTRAMUSCULAR | Status: AC
Start: 2021-10-04 — End: 2021-10-04
  Administered 2021-10-04: 4 mg via INTRAVENOUS
  Filled 2021-10-04: qty 2

## 2021-10-04 MED ORDER — ONDANSETRON HCL 4 MG/2ML IJ SOLN
INTRAMUSCULAR | Status: AC
  Filled 2021-10-04: qty 2

## 2021-10-04 MED ORDER — PIPERACILLIN-TAZOBACTAM 3.375 G IVPB 30 MIN
3.3750 g | Freq: Once | INTRAVENOUS | Status: AC
  Administered 2021-10-04: 3.375 g via INTRAVENOUS
  Filled 2021-10-04: qty 50

## 2021-10-04 MED ORDER — ACETAMINOPHEN 10 MG/ML IV SOLN
INTRAVENOUS | Status: DC | PRN
  Administered 2021-10-04: 1000 mg via INTRAVENOUS

## 2021-10-04 MED ORDER — 0.9 % SODIUM CHLORIDE (POUR BTL) OPTIME
TOPICAL | Status: DC | PRN
  Administered 2021-10-04: 1000 mL

## 2021-10-04 MED ORDER — NICOTINE 7 MG/24HR TD PT24
7.0000 mg | MEDICATED_PATCH | Freq: Every day | TRANSDERMAL | Status: DC
  Administered 2021-10-05 – 2021-10-18 (×14): 7 mg via TRANSDERMAL
  Filled 2021-10-04 (×15): qty 1

## 2021-10-04 MED ORDER — MIDAZOLAM HCL 2 MG/2ML IJ SOLN
INTRAMUSCULAR | Status: AC
  Filled 2021-10-04: qty 2

## 2021-10-04 MED ORDER — POTASSIUM CHLORIDE 10 MEQ/100ML IV SOLN
10.0000 meq | INTRAVENOUS | Status: AC
  Administered 2021-10-04 – 2021-10-05 (×5): 10 meq via INTRAVENOUS
  Filled 2021-10-04: qty 100

## 2021-10-04 MED ORDER — METRONIDAZOLE 500 MG/100ML IV SOLN
500.0000 mg | Freq: Two times a day (BID) | INTRAVENOUS | Status: DC
  Administered 2021-10-04 – 2021-10-05 (×3): 500 mg via INTRAVENOUS
  Filled 2021-10-04 (×3): qty 100

## 2021-10-04 MED ORDER — HYDROMORPHONE HCL 1 MG/ML IJ SOLN
1.0000 mg | Freq: Once | INTRAMUSCULAR | Status: AC
  Administered 2021-10-04: 1 mg via INTRAVENOUS
  Filled 2021-10-04: qty 1

## 2021-10-04 MED ORDER — ACETAMINOPHEN 10 MG/ML IV SOLN
INTRAVENOUS | Status: AC
  Filled 2021-10-04: qty 100

## 2021-10-04 MED ORDER — METOPROLOL TARTRATE 5 MG/5ML IV SOLN
5.0000 mg | Freq: Four times a day (QID) | INTRAVENOUS | Status: DC | PRN
Start: 2021-10-04 — End: 2021-10-18

## 2021-10-04 MED ORDER — BISACODYL 10 MG RE SUPP: 10.0000 mg | Freq: Every day | RECTAL | Status: DC | PRN

## 2021-10-04 MED ORDER — DIPHENHYDRAMINE HCL 50 MG/ML IJ SOLN
25.0000 mg | Freq: Four times a day (QID) | INTRAMUSCULAR | Status: DC | PRN
  Administered 2021-10-08: 25 mg via INTRAVENOUS
  Filled 2021-10-04: qty 1

## 2021-10-04 MED ORDER — ROCURONIUM BROMIDE 10 MG/ML (PF) SYRINGE
PREFILLED_SYRINGE | INTRAVENOUS | Status: DC | PRN
Start: 2021-10-04 — End: 2021-10-04
  Administered 2021-10-04: 40 mg via INTRAVENOUS

## 2021-10-04 MED ORDER — ACETAMINOPHEN 500 MG PO TABS
1000.0000 mg | ORAL_TABLET | Freq: Four times a day (QID) | ORAL | Status: DC
  Administered 2021-10-05 – 2021-10-06 (×5): 1000 mg via ORAL
  Filled 2021-10-04 (×7): qty 2

## 2021-10-04 MED ORDER — PHENYLEPHRINE 40 MCG/ML (10ML) SYRINGE FOR IV PUSH (FOR BLOOD PRESSURE SUPPORT)
PREFILLED_SYRINGE | INTRAVENOUS | Status: DC | PRN
  Administered 2021-10-04 (×2): 120 ug via INTRAVENOUS
  Administered 2021-10-04 (×2): 80 ug via INTRAVENOUS

## 2021-10-04 MED ORDER — SIMETHICONE 80 MG PO CHEW
40.0000 mg | CHEWABLE_TABLET | Freq: Four times a day (QID) | ORAL | Status: DC | PRN
  Administered 2021-10-05 – 2021-10-14 (×5): 40 mg via ORAL
  Filled 2021-10-04 (×6): qty 1

## 2021-10-04 MED ORDER — HYDROMORPHONE HCL 1 MG/ML IJ SOLN
1.0000 mg | INTRAMUSCULAR | Status: DC | PRN
  Administered 2021-10-04 – 2021-10-06 (×9): 1 mg via INTRAVENOUS
  Filled 2021-10-04 (×9): qty 1

## 2021-10-04 MED ORDER — SUGAMMADEX SODIUM 200 MG/2ML IV SOLN
INTRAVENOUS | Status: DC | PRN
Start: 2021-10-04 — End: 2021-10-04
  Administered 2021-10-04: 200 mg via INTRAVENOUS

## 2021-10-04 MED ORDER — ESMOLOL HCL 100 MG/10ML IV SOLN
INTRAVENOUS | Status: AC
  Filled 2021-10-04: qty 10

## 2021-10-04 SURGICAL SUPPLY — 43 items
APPLIER CLIP 5 13 M/L LIGAMAX5 (MISCELLANEOUS)
APPLIER CLIP ROT 10 11.4 M/L (STAPLE)
BAG COUNTER SPONGE SURGICOUNT (BAG) IMPLANT
CABLE HIGH FREQUENCY MONO STRZ (ELECTRODE) ×2 IMPLANT
CHLORAPREP W/TINT 26 (MISCELLANEOUS) ×2 IMPLANT
CLIP APPLIE 5 13 M/L LIGAMAX5 (MISCELLANEOUS) IMPLANT
CLIP APPLIE ROT 10 11.4 M/L (STAPLE) IMPLANT
DECANTER SPIKE VIAL GLASS SM (MISCELLANEOUS) ×2 IMPLANT
DERMABOND ADVANCED (GAUZE/BANDAGES/DRESSINGS) ×1
DERMABOND ADVANCED .7 DNX12 (GAUZE/BANDAGES/DRESSINGS) ×1 IMPLANT
DRAIN CHANNEL 19F RND (DRAIN) ×2 IMPLANT
DRAPE LAPAROSCOPIC ABDOMINAL (DRAPES) IMPLANT
ELECT REM PT RETURN 15FT ADLT (MISCELLANEOUS) ×2 IMPLANT
EVACUATOR SILICONE 100CC (DRAIN) ×2 IMPLANT
GLOVE SURG ENC MOIS LTX SZ6.5 (GLOVE) ×2 IMPLANT
GLOVE SURG UNDER POLY LF SZ7 (GLOVE) ×2 IMPLANT
GOWN STRL REUS W/TWL XL LVL3 (GOWN DISPOSABLE) ×4 IMPLANT
GRASPER SUT TROCAR 14GX15 (MISCELLANEOUS) IMPLANT
HANDLE STAPLE EGIA 4 XL (STAPLE) IMPLANT
IRRIG SUCT STRYKERFLOW 2 WTIP (MISCELLANEOUS) ×2
IRRIGATION SUCT STRKRFLW 2 WTP (MISCELLANEOUS) ×1 IMPLANT
KIT BASIN OR (CUSTOM PROCEDURE TRAY) ×2 IMPLANT
KIT TURNOVER KIT A (KITS) ×2 IMPLANT
MARKER SKIN DUAL TIP RULER LAB (MISCELLANEOUS) IMPLANT
PENCIL SMOKE EVACUATOR (MISCELLANEOUS) IMPLANT
POUCH SPECIMEN RETRIEVAL 10MM (ENDOMECHANICALS) IMPLANT
RELOAD EGIA 45 MED/THCK PURPLE (STAPLE) IMPLANT
RELOAD EGIA 45 TAN VASC (STAPLE) IMPLANT
RELOAD EGIA 60 MED/THCK PURPLE (STAPLE) IMPLANT
RELOAD EGIA 60 TAN VASC (STAPLE) IMPLANT
SCISSORS LAP 5X35 DISP (ENDOMECHANICALS) IMPLANT
SHEARS HARMONIC HDI 36CM (ELECTROSURGICAL) ×2 IMPLANT
SLEEVE XCEL OPT CAN 5 100 (ENDOMECHANICALS) IMPLANT
SPONGE DRAIN TRACH 4X4 STRL 2S (GAUZE/BANDAGES/DRESSINGS) ×2 IMPLANT
SUT VIC AB 2-0 SH 27 (SUTURE)
SUT VIC AB 2-0 SH 27X BRD (SUTURE) IMPLANT
SUT VIC AB 4-0 PS2 27 (SUTURE) ×2 IMPLANT
SUT VICRYL 0 UR6 27IN ABS (SUTURE) IMPLANT
TOWEL OR 17X26 10 PK STRL BLUE (TOWEL DISPOSABLE) ×2 IMPLANT
TRAY FOLEY MTR SLVR 16FR STAT (SET/KITS/TRAYS/PACK) ×2 IMPLANT
TRAY LAPAROSCOPIC (CUSTOM PROCEDURE TRAY) ×2 IMPLANT
TROCAR BLADELESS OPT 5 100 (ENDOMECHANICALS) IMPLANT
TROCAR XCEL BLUNT TIP 100MML (ENDOMECHANICALS) ×2 IMPLANT

## 2021-10-04 NOTE — Op Note (Addendum)
Joshua Cooper [id number]   PRE-OPERATIVE DIAGNOSIS:  appendicitis  POST-OPERATIVE DIAGNOSIS:  perforated, gangrenous appendicitis   Procedure(s): APPENDECTOMY LAPAROSCOPIC    Surgeon(s): Leighton Ruff, MD  ASSISTANT: Richard Miu, PA   ANESTHESIA:   local and general  EBL:   23ml  Delay start of Pharmacological VTE agent (>24hrs) due to surgical blood loss or risk of bleeding:  no  DRAINS: (37F) Jackson-Pratt drain(s) with closed bulb suction in the R peracolic gutter    SPECIMEN:  Source of Specimen:  appendix  DISPOSITION OF SPECIMEN:  PATHOLOGY  COUNTS:  YES  PLAN OF CARE: Admit to inpatient   PATIENT DISPOSITION:  PACU - hemodynamically stable.   INDICATIONS: Patient with concerning symptoms & work up suspicious for appendicitis.  Surgery was recommended:  The anatomy & physiology of the digestive tract was discussed.  The pathophysiology of appendicitis was discussed.  Natural history risks without surgery was discussed.   I feel the risks of no intervention will lead to serious problems that outweigh the operative risks; therefore, I recommended diagnostic laparoscopy with removal of appendix to remove the pathology.  Laparoscopic & open techniques were discussed.   I noted a good likelihood this will help address the problem.    Risks such as bleeding, infection, abscess, leak, reoperation, possible ostomy, hernia, heart attack, death, and other risks were discussed.  Goals of post-operative recovery were discussed as well.  We will work to minimize complications.  Questions were answered.  The patient expresses understanding & wishes to proceed with surgery.  OR FINDINGS: gangrenous perforated appendicitis with stool and purulence throughout the RLQ  DESCRIPTION:   The patient was identified & brought into the operating room. The patient was positioned supine with left arm tucked. SCDs were active during the entire case. The patient underwent general  anesthesia without any difficulty.  A foley catheter was inserted under sterile conditions. The abdomen was prepped and draped in a sterile fashion. A Surgical Timeout confirmed our plan.   I made a transverse incision through the superior umbilical fold.  I made a nick in the infraumbilical fascia and confirmed peritoneal entry.  I placed a stay suture and then the [facility] port.  We induced carbon dioxide insufflation.  Camera inspection revealed no injury.  I placed additional ports under direct laparoscopic visualization.  Upon entering and evaluating the abdomen (organ space), I encountered feculent peritonitis due to a rupture in the mid appendix.   I mobilized the terminal ileum to proximal ascending colon in a lateral to medial fashion.  Stool and purulence was noted throughout the right lower quadrant.  I bluntly dissected the cecum out of this area.  I took care to avoid injuring any retroperitoneal structures.  I freed the appendix off its attachments to the ascending colon and cecal mesentery.  I divided the mesoappendix using a harmonic device.  I elevated the appendix.  I was able to free off the base of the appendix, which was still viable.  I stapled the appendix off the cecum using a 65 mm laparoscopic Covidian purple load stapler.  I took a healthy cuff viable cecum.  I placed the appendix inside an EndoCatch bag and removed out the [place] port.  I did copious irrigation with 2L of saline. Hemostasis was good in the mesoappendix, colon mesentery, and retroperitoneum. Staple line was intact on the cecum with no bleeding. I washed out the pelvis, retrohepatic space and right paracolic gutter.  Hemostasis is good. There was no perforation or  injury.  Given the frank peritonitis and purulence noted throughout the abdomen, a 19 French drain was placed into the pelvis and aligned across the right paracolic gutter.  This was brought out through the left lower quadrant port site and secured into place  with a 2-0 nylon suture.  I aspirated the carbon dioxide. I removed the ports. I closed the umbilical fascia site using a 0 Vicryl stitch. I closed skin using 4-0 vicryl stitch.  Sterile dressings were applied.  Patient was extubated and sent to the recovery room. I suspect the patient is going used in the hospital at least overnight and will need antibiotics for 5 days. Questions answered. They expressed understanding and appreciation.

## 2021-10-04 NOTE — Anesthesia Procedure Notes (Signed)
Procedure Name: Intubation Date/Time: 10/04/2021 12:18 PM Performed by: Eben Burow, CRNA Pre-anesthesia Checklist: Patient identified, Emergency Drugs available, Suction available, Patient being monitored and Timeout performed Patient Re-evaluated:Patient Re-evaluated prior to induction Oxygen Delivery Method: Circle system utilized Preoxygenation: Pre-oxygenation with 100% oxygen Induction Type: IV induction and Rapid sequence Laryngoscope Size: Mac and 4 Grade View: Grade I Tube type: Oral Tube size: 7.5 mm Number of attempts: 1 Airway Equipment and Method: Stylet Placement Confirmation: ETT inserted through vocal cords under direct vision, positive ETCO2 and breath sounds checked- equal and bilateral Secured at: 22 cm Tube secured with: Tape Dental Injury: Teeth and Oropharynx as per pre-operative assessment

## 2021-10-04 NOTE — Anesthesia Preprocedure Evaluation (Addendum)
Anesthesia Evaluation  Patient identified by MRN, date of birth, ID band Patient awake    Reviewed: Allergy & Precautions, NPO status , Patient's Chart, lab work & pertinent test results  Airway Mallampati: II  TM Distance: >3 FB Neck ROM: Full    Dental  (+) Teeth Intact   Pulmonary neg pulmonary ROS, Current Smoker and Patient abstained from smoking.,    Pulmonary exam normal        Cardiovascular negative cardio ROS   Rhythm:Regular Rate:Normal     Neuro/Psych Depression negative neurological ROS     GI/Hepatic Neg liver ROS, Acute appendicitis    Endo/Other  negative endocrine ROS  Renal/GU negative Renal ROS  negative genitourinary   Musculoskeletal  (+) Arthritis , Osteoarthritis,  S/p cervical fusion   Abdominal (+)  Abdomen: soft.    Peds  Hematology negative hematology ROS (+)   Anesthesia Other Findings   Reproductive/Obstetrics                            Anesthesia Physical Anesthesia Plan  ASA: 2  Anesthesia Plan: General   Post-op Pain Management:    Induction: Intravenous  PONV Risk Score and Plan: 1 and Ondansetron, Dexamethasone, Midazolam and Treatment may vary due to age or medical condition  Airway Management Planned: Mask and Oral ETT  Additional Equipment: None  Intra-op Plan:   Post-operative Plan: Extubation in OR  Informed Consent: I have reviewed the patients History and Physical, chart, labs and discussed the procedure including the risks, benefits and alternatives for the proposed anesthesia with the patient or authorized representative who has indicated his/her understanding and acceptance.     Dental advisory given  Plan Discussed with: CRNA  Anesthesia Plan Comments: (Lab Results      Component                Value               Date                      WBC                      22.0 (H)            10/04/2021                HGB                       10.8 (L)            10/04/2021                HCT                      30.9 (L)            10/04/2021                MCV                      88.8                10/04/2021                PLT                      269  10/04/2021           Lab Results      Component                Value               Date                      NA                       131 (L)             10/04/2021                K                        3.0 (L)             10/04/2021                CO2                      25                  10/04/2021                GLUCOSE                  126 (H)             10/04/2021                BUN                      11                  10/04/2021                CREATININE               0.60 (L)            10/04/2021                CALCIUM                  9.3                 10/04/2021                GFRNONAA                 >60                 10/04/2021          )        Anesthesia Quick Evaluation

## 2021-10-04 NOTE — Anesthesia Postprocedure Evaluation (Signed)
Anesthesia Post Note  Patient: Brentley Sellers  Procedure(s) Performed: APPENDECTOMY LAPAROSCOPIC (Abdomen)     Patient location during evaluation: PACU Anesthesia Type: General Level of consciousness: awake and alert Pain management: pain level controlled Vital Signs Assessment: post-procedure vital signs reviewed and stable Respiratory status: spontaneous breathing, nonlabored ventilation, respiratory function stable and patient connected to nasal cannula oxygen Cardiovascular status: blood pressure returned to baseline and stable Postop Assessment: no apparent nausea or vomiting Anesthetic complications: no   No notable events documented.  Last Vitals:  Vitals:   10/04/21 1415 10/04/21 1430  BP: 123/90 (!) 125/92  Pulse: 94 92  Resp: 12 (!) 133  Temp:  36.9 C  SpO2: 97% 98%    Last Pain:  Vitals:   10/04/21 1430  TempSrc:   PainSc: Asleep                 Belenda Cruise P Bassy Fetterly
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
## 2021-10-04 NOTE — ED Notes (Signed)
Pt unable to provide urine sample at this time. Urinal left at bedside.

## 2021-10-04 NOTE — Transfer of Care (Signed)
Immediate Anesthesia Transfer of Care Note  Patient: Joshua Cooper  Procedure(s) Performed: APPENDECTOMY LAPAROSCOPIC (Abdomen)  Patient Location: PACU  Anesthesia Type:General  Level of Consciousness: awake  Airway & Oxygen Therapy: Patient Spontanous Breathing and Patient connected to face mask oxygen  Post-op Assessment: Report given to RN and Post -op Vital signs reviewed and stable  Post vital signs: Reviewed and stable  Last Vitals:  Vitals Value Taken Time  BP 122/88 10/04/21 1332  Temp    Pulse 102 10/04/21 1334  Resp 14 10/04/21 1334  SpO2 97 % 10/04/21 1334  Vitals shown include unvalidated device data.  Last Pain:  Vitals:   10/04/21 1119  TempSrc:   PainSc: 6          Complications: No notable events documented.

## 2021-10-04 NOTE — ED Notes (Signed)
Pt. Made aware for the need of urine specimen. 
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
## 2021-10-05 ENCOUNTER — Encounter (HOSPITAL_COMMUNITY): Payer: Self-pay | Admitting: General Surgery

## 2021-10-05 DIAGNOSIS — K3532 Acute appendicitis with perforation and localized peritonitis, without abscess: Secondary | ICD-10-CM

## 2021-10-05 HISTORY — DX: Acute appendicitis with perforation, localized peritonitis, and gangrene, without abscess: K35.32

## 2021-10-05 LAB — SURGICAL PATHOLOGY

## 2021-10-05 LAB — CBC
HCT: 29.4 % — ABNORMAL LOW (ref 39.0–52.0)
Hemoglobin: 10.1 g/dL — ABNORMAL LOW (ref 13.0–17.0)
MCH: 30.9 pg (ref 26.0–34.0)
MCHC: 34.4 g/dL (ref 30.0–36.0)
MCV: 89.9 fL (ref 80.0–100.0)
Platelets: 229 10*3/uL (ref 150–400)
RBC: 3.27 MIL/uL — ABNORMAL LOW (ref 4.22–5.81)
RDW: 13.1 % (ref 11.5–15.5)
WBC: 20.1 10*3/uL — ABNORMAL HIGH (ref 4.0–10.5)
nRBC: 0 % (ref 0.0–0.2)

## 2021-10-05 LAB — BASIC METABOLIC PANEL
Anion gap: 10 (ref 5–15)
BUN: 16 mg/dL (ref 6–20)
CO2: 26 mmol/L (ref 22–32)
Calcium: 8.8 mg/dL — ABNORMAL LOW (ref 8.9–10.3)
Chloride: 103 mmol/L (ref 98–111)
Creatinine, Ser: 0.66 mg/dL (ref 0.61–1.24)
GFR, Estimated: >60 mL/min (ref >60)
Glucose, Bld: 107 mg/dL — ABNORMAL HIGH (ref 70–99)
Potassium: 3.9 mmol/L (ref 3.5–5.1)
Sodium: 139 mmol/L (ref 135–145)

## 2021-10-05 MED ORDER — PIPERACILLIN-TAZOBACTAM 3.375 G IVPB
3.3750 g | Freq: Three times a day (TID) | INTRAVENOUS | Status: DC
  Administered 2021-10-05 – 2021-10-17 (×35): 3.375 g via INTRAVENOUS
  Filled 2021-10-05 (×36): qty 50
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
## 2021-10-05 NOTE — Plan of Care (Signed)
  Problem: Education: Goal: Required Educational Video(s) Outcome: Progressing   Problem: Clinical Measurements: Goal: Ability to maintain clinical measurements within normal limits will improve Outcome: Progressing Goal: Postoperative complications will be avoided or minimized Outcome: Progressing   Problem: Skin Integrity: Goal: Demonstration of wound healing without infection will improve Outcome: Progressing   

## 2021-10-06 ENCOUNTER — Inpatient Hospital Stay (HOSPITAL_COMMUNITY): Payer: Self-pay

## 2021-10-06 LAB — CBC
HCT: 31.2 % — ABNORMAL LOW (ref 39.0–52.0)
Hemoglobin: 10.7 g/dL — ABNORMAL LOW (ref 13.0–17.0)
MCH: 30.6 pg (ref 26.0–34.0)
MCHC: 34.3 g/dL (ref 30.0–36.0)
MCV: 89.1 fL (ref 80.0–100.0)
Platelets: 260 10*3/uL (ref 150–400)
RBC: 3.5 MIL/uL — ABNORMAL LOW (ref 4.22–5.81)
RDW: 13 % (ref 11.5–15.5)
WBC: 12.5 10*3/uL — ABNORMAL HIGH (ref 4.0–10.5)
nRBC: 0 % (ref 0.0–0.2)

## 2021-10-06 MED ORDER — HYDROMORPHONE HCL 1 MG/ML IJ SOLN
1.0000 mg | INTRAMUSCULAR | Status: DC | PRN
  Administered 2021-10-06 (×2): 1 mg via INTRAVENOUS
  Administered 2021-10-06: 2 mg via INTRAVENOUS
  Administered 2021-10-07: 1 mg via INTRAVENOUS
  Administered 2021-10-07: 2 mg via INTRAVENOUS
  Administered 2021-10-07 (×4): 1 mg via INTRAVENOUS
  Administered 2021-10-08: 2 mg via INTRAVENOUS
  Administered 2021-10-08: 1 mg via INTRAVENOUS
  Administered 2021-10-08 (×2): 2 mg via INTRAVENOUS
  Administered 2021-10-08: 1 mg via INTRAVENOUS
  Administered 2021-10-08: 2 mg via INTRAVENOUS
  Administered 2021-10-08 (×2): 1 mg via INTRAVENOUS
  Administered 2021-10-08 – 2021-10-09 (×6): 2 mg via INTRAVENOUS
  Administered 2021-10-10: 1 mg via INTRAVENOUS
  Filled 2021-10-06 (×4): qty 1
  Filled 2021-10-06 (×7): qty 2
  Filled 2021-10-06 (×3): qty 1
  Filled 2021-10-06 (×2): qty 2
  Filled 2021-10-06: qty 1
  Filled 2021-10-06: qty 2
  Filled 2021-10-06 (×3): qty 1
  Filled 2021-10-06 (×2): qty 2
  Filled 2021-10-06: qty 1

## 2021-10-06 MED ORDER — ACETAMINOPHEN 10 MG/ML IV SOLN
1000.0000 mg | Freq: Four times a day (QID) | INTRAVENOUS | Status: AC
  Administered 2021-10-06 – 2021-10-07 (×4): 1000 mg via INTRAVENOUS
  Filled 2021-10-06 (×4): qty 100

## 2021-10-06 MED ORDER — METHOCARBAMOL 1000 MG/10ML IJ SOLN
500.0000 mg | Freq: Four times a day (QID) | INTRAVENOUS | Status: DC | PRN
  Administered 2021-10-06 – 2021-10-09 (×8): 500 mg via INTRAVENOUS
  Filled 2021-10-06: qty 500
  Filled 2021-10-06 (×2): qty 5
  Filled 2021-10-06 (×2): qty 500
  Filled 2021-10-06: qty 5
  Filled 2021-10-06 (×5): qty 500
  Filled 2021-10-06: qty 5

## 2021-10-06 MED ORDER — KETOROLAC TROMETHAMINE 15 MG/ML IJ SOLN
15.0000 mg | Freq: Four times a day (QID) | INTRAMUSCULAR | Status: AC | PRN
  Administered 2021-10-08 – 2021-10-10 (×3): 15 mg via INTRAVENOUS
  Filled 2021-10-06 (×3): qty 1

## 2021-10-06 NOTE — Progress Notes (Signed)
2 Days Post-Op lap appy and abd washout Subjective: Complaining of significant pain this morning as well as a sensation of reflux.  Despite having an order for n.p.o. with sips of clears he has taken in over a liter in the last 24 hours  Objective: Vital signs in last 24 hours: Temp:  [97.5 F (36.4 C)-98.5 F (36.9 C)] 98.5 F (36.9 C) (10/08 0534) Pulse Rate:  [76-92] 92 (10/08 0534) Resp:  [16-18] 18 (10/08 0534) BP: (120-142)/(73-100) 142/73 (10/08 0534) SpO2:  [95 %-100 %] 95 % (10/08 0534)   Intake/Output from previous day: 10/07 0701 - 10/08 0700 In: 7114.8 [P.O.:1560; I.V.:5454.8; IV Piggyback:100] Out: 125 [Drains:125] Intake/Output this shift: No intake/output data recorded.   General appearance: alert and cooperative GI: Tensely distended, diffusely tender.  Incisions clean dry intact JP: cloudy SSF Incision: no significant drainage  Lab Results:  Recent Labs    10/05/21 0825 10/06/21 0512  WBC 20.1* 12.5*  HGB 10.1* 10.7*  HCT 29.4* 31.2*  PLT 229 260    BMET Recent Labs    10/04/21 0621 10/05/21 0825  NA 131* 139  K 3.0* 3.9  CL 98 103  CO2 25 26  GLUCOSE 126* 107*  BUN 11 16  CREATININE 0.60* 0.66  CALCIUM 9.3 8.8*    PT/INR No results for input(s): LABPROT, INR in the last 72 hours. ABG No results for input(s): PHART, HCO3 in the last 72 hours.  Invalid input(s): PCO2, PO2  MEDS, Scheduled  acetaminophen  1,000 mg Oral Q6H   docusate sodium  100 mg Oral BID   enoxaparin (LOVENOX) injection  40 mg Subcutaneous Q24H   nicotine  7 mg Transdermal Daily   venlafaxine XR  150 mg Oral QPM    Studies/Results: No results found.  Assessment: s/p Procedure(s): APPENDECTOMY LAPAROSCOPIC Patient Active Problem List   Diagnosis Date Noted   Perforated appendicitis 10/05/2021   Acute appendicitis 10/04/2021    Perforated appendicitis with frank stool contamination of the RLQ and pelvis  Plan: Expected ileus with significant  distention-Place NG tube this morning, change pain medication regimen to IV JP can come out when output clears He is at high risk for intra-abd abscess  Follow wbc  LOS: 1 day     [facility], [place]    10/06/2021 9:30 AM

## 2021-10-06 NOTE — Progress Notes (Signed)
Late entry. Pt has repeatedly been told to take sips of liquids only. In spite of this, he has asked various staff members to bring him a cup of broth, some jello, soda and cranberry juice. Pt even tried to call downstairs and order himself a clear liquid tray. Reminded pt of consequences of not following MD orders. He verbalized understanding.

## 2021-10-07 LAB — CBC
HCT: 31.6 % — ABNORMAL LOW (ref 39.0–52.0)
Hemoglobin: 10.7 g/dL — ABNORMAL LOW (ref 13.0–17.0)
MCH: 30.2 pg (ref 26.0–34.0)
MCHC: 33.9 g/dL (ref 30.0–36.0)
MCV: 89.3 fL (ref 80.0–100.0)
Platelets: 268 K/uL (ref 150–400)
RBC: 3.54 MIL/uL — ABNORMAL LOW (ref 4.22–5.81)
RDW: 13.4 % (ref 11.5–15.5)
WBC: 8.6 K/uL (ref 4.0–10.5)
nRBC: 0 % (ref 0.0–0.2)

## 2021-10-07 LAB — BASIC METABOLIC PANEL
Anion gap: 11 (ref 5–15)
BUN: 18 mg/dL (ref 6–20)
CO2: 26 mmol/L (ref 22–32)
Calcium: 8.3 mg/dL — ABNORMAL LOW (ref 8.9–10.3)
Chloride: 98 mmol/L (ref 98–111)
Creatinine, Ser: 0.69 mg/dL (ref 0.61–1.24)
GFR, Estimated: >60 mL/min (ref >60)
Glucose, Bld: 88 mg/dL (ref 70–99)
Potassium: 3.2 mmol/L — ABNORMAL LOW (ref 3.5–5.1)
Sodium: 135 mmol/L (ref 135–145)

## 2021-10-07 LAB — MAGNESIUM: Magnesium: 1.6 mg/dL — ABNORMAL LOW (ref 1.7–2.4)

## 2021-10-07 MED ORDER — PANTOPRAZOLE SODIUM 40 MG IV SOLR
40.0000 mg | Freq: Two times a day (BID) | INTRAVENOUS | Status: DC
  Administered 2021-10-07 – 2021-10-12 (×11): 40 mg via INTRAVENOUS
  Filled 2021-10-07 (×11): qty 40

## 2021-10-07 MED ORDER — MAGNESIUM SULFATE 4 GM/100ML IV SOLN
4.0000 g | Freq: Once | INTRAVENOUS | Status: AC
  Administered 2021-10-07: 4 g via INTRAVENOUS
  Filled 2021-10-07 (×2): qty 100

## 2021-10-07 MED ORDER — MAGNESIUM SULFATE 4 GM/100ML IV SOLN
4.0000 g | Freq: Once | INTRAVENOUS | Status: DC
  Filled 2021-10-07: qty 100

## 2021-10-07 MED ORDER — POTASSIUM CHLORIDE 10 MEQ/100ML IV SOLN
10.0000 meq | INTRAVENOUS | Status: AC
  Administered 2021-10-07 (×6): 10 meq via INTRAVENOUS
  Filled 2021-10-07 (×6): qty 100

## 2021-10-07 NOTE — Progress Notes (Signed)
3 Days Post-Op lap appy and abd washout Subjective: Feeling much better since NG placed.  Mouth dry.  Abdomen bloated but better since NG.  Passing some flatus, no bowel movements.  Objective: Vital signs in last 24 hours: Temp:  [97.3 F (36.3 C)-98.3 F (36.8 C)] 97.3 F (36.3 C) (10/09 0507) Pulse Rate:  [89-108] 89 (10/09 0507) Resp:  [16] 16 (10/09 0507) BP: (139-147)/(93-96) 147/96 (10/09 0507) SpO2:  [96 %-97 %] 97 % (10/09 0507)   Intake/Output from previous day: 10/08 0701 - 10/09 0700 In: 1705.2 [P.O.:60; I.V.:1054.7; IV Piggyback:590.5] Out: 2505 [Urine:300; Emesis/NG output:2150; Drains:55] Intake/Output this shift: No intake/output data recorded.   General appearance: alert and cooperative GI: Significant distention.  Incisions clean dry intact JP: cloudy SSF Incision: no significant drainage  Lab Results:  Recent Labs    10/06/21 0512 10/07/21 0437  WBC 12.5* 8.6  HGB 10.7* 10.7*  HCT 31.2* 31.6*  PLT 260 268    BMET Recent Labs    10/05/21 0825 10/07/21 0437  NA 139 135  K 3.9 3.2*  CL 103 98  CO2 26 26  GLUCOSE 107* 88  BUN 16 18  CREATININE 0.66 0.69  CALCIUM 8.8* 8.3*    PT/INR No results for input(s): LABPROT, INR in the last 72 hours. ABG No results for input(s): PHART, HCO3 in the last 72 hours.  Invalid input(s): PCO2, PO2  MEDS, Scheduled  enoxaparin (LOVENOX) injection  40 mg Subcutaneous Q24H   nicotine  7 mg Transdermal Daily   pantoprazole (PROTONIX) IV  40 mg Intravenous Q12H   venlafaxine XR  150 mg Oral QPM    Studies/Results: DG Abd 1 View  Result Date: 10/06/2021 CLINICAL DATA:  45 year old male NG tube placement. EXAM: ABDOMEN - 1 VIEW COMPARISON:  CT Abdomen and Pelvis 10/04/2021. FINDINGS: Portable AP view at 1034 hours. Enteric tube placed into the stomach, side hole at the level of the gastric body. Patchy bilateral lung base opacity, perhaps atelectasis. Ongoing gas distended small bowel loops in the  abdomen, individually up to 5 cm diameter. Paucity of colonic gas. No acute osseous abnormality identified. IMPRESSION: 1. Enteric tube placed into the stomach, side hole at the level of the gastric body. 2. Small-Bowel Obstruction gas pattern. 3. Patchy lung base opacity, favor atelectasis. Electronically Signed   By: Genevie Ann M.D.   On: 10/06/2021 10:47    Assessment: s/p Procedure(s): APPENDECTOMY LAPAROSCOPIC Patient Active Problem List   Diagnosis Date Noted   Perforated appendicitis 10/05/2021   Acute appendicitis 10/04/2021    Perforated appendicitis with frank stool contamination of the RLQ and pelvis  Plan: Expected ileus with significant distention-much improved since NG tube JP can come out when output clears He is at high risk for intra-abd abscess  Follow wbc - leukocytosis resolved Increase activity Protonix  LOS: 2 days     [facility], [place]    10/07/2021 10:15 AM

## 2021-10-08 LAB — BASIC METABOLIC PANEL
Anion gap: 9 (ref 5–15)
BUN: 15 mg/dL (ref 6–20)
CO2: 26 mmol/L (ref 22–32)
Calcium: 8 mg/dL — ABNORMAL LOW (ref 8.9–10.3)
Chloride: 105 mmol/L (ref 98–111)
Creatinine, Ser: 0.57 mg/dL — ABNORMAL LOW (ref 0.61–1.24)
GFR, Estimated: >60 mL/min (ref >60)
Glucose, Bld: 91 mg/dL (ref 70–99)
Potassium: 3.1 mmol/L — ABNORMAL LOW (ref 3.5–5.1)
Sodium: 140 mmol/L (ref 135–145)

## 2021-10-08 LAB — CBC
HCT: 29.6 % — ABNORMAL LOW (ref 39.0–52.0)
Hemoglobin: 10.1 g/dL — ABNORMAL LOW (ref 13.0–17.0)
MCH: 30.2 pg (ref 26.0–34.0)
MCHC: 34.1 g/dL (ref 30.0–36.0)
MCV: 88.6 fL (ref 80.0–100.0)
Platelets: 262 10*3/uL (ref 150–400)
RBC: 3.34 MIL/uL — ABNORMAL LOW (ref 4.22–5.81)
RDW: 13.4 % (ref 11.5–15.5)
WBC: 11.8 10*3/uL — ABNORMAL HIGH (ref 4.0–10.5)
nRBC: 0 % (ref 0.0–0.2)

## 2021-10-08 LAB — MAGNESIUM: Magnesium: 1.9 mg/dL (ref 1.7–2.4)

## 2021-10-08 MED ORDER — ACETAMINOPHEN 10 MG/ML IV SOLN
1000.0000 mg | Freq: Four times a day (QID) | INTRAVENOUS | Status: AC
  Administered 2021-10-08 – 2021-10-09 (×3): 1000 mg via INTRAVENOUS
  Filled 2021-10-08 (×4): qty 100

## 2021-10-08 MED ORDER — POTASSIUM CHLORIDE 10 MEQ/100ML IV SOLN
10.0000 meq | INTRAVENOUS | Status: AC
  Administered 2021-10-08 – 2021-10-09 (×2): 10 meq via INTRAVENOUS
  Filled 2021-10-08 (×2): qty 100

## 2021-10-08 MED ORDER — POTASSIUM CHLORIDE 2 MEQ/ML IV SOLN: INTRAVENOUS | Status: DC

## 2021-10-08 MED ORDER — KCL IN DEXTROSE-NACL 20-5-0.45 MEQ/L-%-% IV SOLN
INTRAVENOUS | Status: DC
  Filled 2021-10-08 (×2): qty 1000

## 2021-10-08 NOTE — Progress Notes (Signed)
Patient will let his wife: Andree Coss come and visit him. Patient verbalizes frustration about not getting food/soda. Explained that it might cause harm to his GI tract. Patient also wishes to be seen by Psych Doctor.

## 2021-10-08 NOTE — Progress Notes (Signed)
Patient ID: Joshua Cooper, male   DOB: January 09, 1976, 45 y.o.   MRN: [id number] [facility] Surgery Progress Note  4 Days Post-Op  Subjective: CC-  Feeling a little better each day, but abdomen still sore and somewhat bloated. Started passing a little flatus, no BM. NG with 1.5L out last 24 hours, but seems to have slowed down that last 12 hours.  Objective: Vital signs in last 24 hours: Temp:  [97.6 F (36.4 C)-99.2 F (37.3 C)] 97.6 F (36.4 C) (10/10 0543) Pulse Rate:  [78-90] 78 (10/10 0543) Resp:  [18] 18 (10/10 0543) BP: (142-158)/(89-98) 158/92 (10/10 0543) SpO2:  [97 %-98 %] 98 % (10/10 0543) Last BM Date: 10/01/21  Intake/Output from previous day: 10/09 0701 - 10/10 0700 In: 1188.1 [I.V.:658.5; IV Piggyback:529.6] Out: 2238 [Urine:700; Emesis/NG output:1500; Drains:38] Intake/Output this shift: Total I/O In: -  Out: 550 [Urine:200; Emesis/NG output:345; Drains:5]  PE: Gen:  Alert, NAD, pleasant Pulm:  rate and effort normal Abd: distended but soft, few BS heard, incisions cdi, drain with serous fluid in bulb  Lab Results:  Recent Labs    10/07/21 0437 10/08/21 0425  WBC 8.6 11.8*  HGB 10.7* 10.1*  HCT 31.6* 29.6*  PLT 268 262   BMET Recent Labs    10/07/21 0437 10/08/21 0425  NA 135 140  K 3.2* 3.1*  CL 98 105  CO2 26 26  GLUCOSE 88 91  BUN 18 15  CREATININE 0.69 0.57*  CALCIUM 8.3* 8.0*   PT/INR No results for input(s): LABPROT, INR in the last 72 hours. CMP     Component Value Date/Time   NA 140 10/08/2021 0425   K 3.1 (L) 10/08/2021 0425   CL 105 10/08/2021 0425   CO2 26 10/08/2021 0425   GLUCOSE 91 10/08/2021 0425   BUN 15 10/08/2021 0425   CREATININE 0.57 (L) 10/08/2021 0425   CALCIUM 8.0 (L) 10/08/2021 0425   PROT 6.7 10/04/2021 0621   ALBUMIN 3.5 10/04/2021 0621   AST 22 10/04/2021 0621   ALT 14 10/04/2021 0621   ALKPHOS 64 10/04/2021 0621   BILITOT 1.7 (H) 10/04/2021 0621   GFRNONAA >60 10/08/2021 0425   Lipase      Component Value Date/Time   LIPASE 24 10/04/2021 0621       Studies/Results: DG Abd 1 View  Result Date: 10/06/2021 CLINICAL DATA:  45 year old male NG tube placement. EXAM: ABDOMEN - 1 VIEW COMPARISON:  CT Abdomen and Pelvis 10/04/2021. FINDINGS: Portable AP view at 1034 hours. Enteric tube placed into the stomach, side hole at the level of the gastric body. Patchy bilateral lung base opacity, perhaps atelectasis. Ongoing gas distended small bowel loops in the abdomen, individually up to 5 cm diameter. Paucity of colonic gas. No acute osseous abnormality identified. IMPRESSION: 1. Enteric tube placed into the stomach, side hole at the level of the gastric body. 2. Small-Bowel Obstruction gas pattern. 3. Patchy lung base opacity, favor atelectasis. Electronically Signed   By: Genevie Ann M.D.   On: 10/06/2021 10:47    Anti-infectives: Anti-infectives (From admission, onward)    Start     Dose/Rate Route Frequency Ordered Stop   10/05/21 1000  piperacillin-tazobactam (ZOSYN) IVPB 3.375 g        3.375 g 12.5 mL/hr over 240 Minutes Intravenous Every 8 hours 10/05/21 0848     10/04/21 1500  cefTRIAXone (ROCEPHIN) 2 g in sodium chloride 0.9 % 100 mL IVPB  Status:  Discontinued       See  Hyperspace for full Linked Orders Report.   2 g 200 mL/hr over 30 Minutes Intravenous Daily 10/04/21 1022 10/05/21 0848   10/04/21 1500  metroNIDAZOLE (FLAGYL) IVPB 500 mg  Status:  Discontinued       See Hyperspace for full Linked Orders Report.   500 mg 100 mL/hr over 60 Minutes Intravenous 2 times daily 10/04/21 1022 10/05/21 0848   10/04/21 0900  piperacillin-tazobactam (ZOSYN) IVPB 3.375 g        3.375 g 100 mL/hr over 30 Minutes Intravenous  Once 10/04/21 0850 10/04/21 0934        Assessment/Plan POD#4 S/p laparoscopic appendectomy for Perforated gangrenous appendicitis - Persistent ileus, but has started passing flatus and NG tube output slowing down. Continue NGT to LIWS for now, will recheck  later today for possible clamping trial - monitor JP drainage - currently serous - continue IV abx - WBC up to 11.8, afebrile. Monitor, if WBC remains elevated will consider CT scan tomorrow to rule out intraabdominal abscess - mobilize - replete K - add IV tylenol for better pain control and to limit narcotic use  ID - zosyn 10/6>> FEN - change IVF to dextrose with K@100cc /hr, NPO VTE - SCDs, lovenox Foley - none   LOS: 3 days    Wellington Hampshire, [facility] 10/08/2021, 10:34 AM Please see Amion for pager number during day hours 7:00am-4:30pm

## 2021-10-08 NOTE — Progress Notes (Signed)
   10/08/21 1400  Mobility  Activity Refused mobility   Mobilized with RN/NT prior to MS arrival. Hold mobility until tomorrow.   [place] Specialist Acute Rehab Services Office: [phone]

## 2021-10-09 ENCOUNTER — Encounter (HOSPITAL_COMMUNITY): Payer: Self-pay

## 2021-10-09 ENCOUNTER — Inpatient Hospital Stay (HOSPITAL_COMMUNITY): Payer: Self-pay

## 2021-10-09 LAB — CBC
HCT: 28.5 % — ABNORMAL LOW (ref 39.0–52.0)
Hemoglobin: 9.8 g/dL — ABNORMAL LOW (ref 13.0–17.0)
MCH: 30.3 pg (ref 26.0–34.0)
MCHC: 34.4 g/dL (ref 30.0–36.0)
MCV: 88.2 fL (ref 80.0–100.0)
Platelets: 297 10*3/uL (ref 150–400)
RBC: 3.23 MIL/uL — ABNORMAL LOW (ref 4.22–5.81)
RDW: 13.5 % (ref 11.5–15.5)
WBC: 14.3 10*3/uL — ABNORMAL HIGH (ref 4.0–10.5)
nRBC: 0 % (ref 0.0–0.2)

## 2021-10-09 LAB — PROTIME-INR
INR: 1 (ref 0.8–1.2)
Prothrombin Time: 13.4 seconds (ref 11.4–15.2)

## 2021-10-09 LAB — BASIC METABOLIC PANEL
Anion gap: 7 (ref 5–15)
BUN: 9 mg/dL (ref 6–20)
CO2: 26 mmol/L (ref 22–32)
Calcium: 7.8 mg/dL — ABNORMAL LOW (ref 8.9–10.3)
Chloride: 99 mmol/L (ref 98–111)
Creatinine, Ser: 0.44 mg/dL — ABNORMAL LOW (ref 0.61–1.24)
GFR, Estimated: >60 mL/min (ref >60)
Glucose, Bld: 112 mg/dL — ABNORMAL HIGH (ref 70–99)
Potassium: 3.2 mmol/L — ABNORMAL LOW (ref 3.5–5.1)
Sodium: 132 mmol/L — ABNORMAL LOW (ref 135–145)

## 2021-10-09 LAB — CULTURE, BLOOD (SINGLE): Culture: NO GROWTH

## 2021-10-09 LAB — MAGNESIUM: Magnesium: 1.5 mg/dL — ABNORMAL LOW (ref 1.7–2.4)

## 2021-10-09 MED ORDER — ACETAMINOPHEN 10 MG/ML IV SOLN
1000.0000 mg | Freq: Four times a day (QID) | INTRAVENOUS | Status: AC
  Administered 2021-10-09 – 2021-10-10 (×3): 1000 mg via INTRAVENOUS
  Filled 2021-10-09 (×4): qty 100

## 2021-10-09 MED ORDER — IOHEXOL 350 MG/ML SOLN
80.0000 mL | Freq: Once | INTRAVENOUS | Status: AC | PRN
  Administered 2021-10-09: 80 mL via INTRAVENOUS

## 2021-10-09 MED ORDER — MAGNESIUM SULFATE 2 GM/50ML IV SOLN
2.0000 g | Freq: Once | INTRAVENOUS | Status: AC
  Administered 2021-10-09: 2 g via INTRAVENOUS
  Filled 2021-10-09: qty 50

## 2021-10-09 MED ORDER — POTASSIUM CHLORIDE 10 MEQ/100ML IV SOLN
10.0000 meq | INTRAVENOUS | Status: AC
Start: 2021-10-09 — End: 2021-10-09
  Administered 2021-10-09 (×3): 10 meq via INTRAVENOUS
  Filled 2021-10-09: qty 100

## 2021-10-09 MED ORDER — MAGNESIUM SULFATE 50 % IJ SOLN: 2.0000 g | Freq: Once | INTRAMUSCULAR | Status: DC

## 2021-10-09 MED ORDER — KCL IN DEXTROSE-NACL 40-5-0.45 MEQ/L-%-% IV SOLN
INTRAVENOUS | Status: DC
  Filled 2021-10-09 (×5): qty 1000

## 2021-10-09 NOTE — Progress Notes (Signed)
5 Days Post-Op   Subjective/Chief Complaint: Pt doing well this AM Some con't distention,   + flatus   Objective: Vital signs in last 24 hours: Temp:  [98 F (36.7 C)-98.4 F (36.9 C)] 98 F (36.7 C) (10/11 0443) Pulse Rate:  [73-74] 74 (10/11 0443) Resp:  [16-18] 16 (10/11 0443) BP: (154-161)/(95-100) 154/100 (10/11 0443) SpO2:  [95 %-98 %] 95 % (10/11 0443) Last BM Date: 10/01/21  Intake/Output from previous day: 10/10 0701 - 10/11 0700 In: 1735.9 [P.O.:180; I.V.:1321.7; IV Piggyback:234.2] Out: 1355 [Urine:500; Emesis/NG output:845; Drains:10] Intake/Output this shift: Total I/O In: -  Out: 625 [Urine:625]  PE: Gen:  Alert, NAD, pleasant Pulm:  rate and effort normal Abd: distended but soft, few BS heard, incisions cdi, drain with serous fluid in bulb   Lab Results:  Recent Labs    10/08/21 0425 10/09/21 0401  WBC 11.8* 14.3*  HGB 10.1* 9.8*  HCT 29.6* 28.5*  PLT 262 297   BMET Recent Labs    10/08/21 0425 10/09/21 0401  NA 140 132*  K 3.1* 3.2*  CL 105 99  CO2 26 26  GLUCOSE 91 112*  BUN 15 9  CREATININE 0.57* 0.44*  CALCIUM 8.0* 7.8*   PT/INR No results for input(s): LABPROT, INR in the last 72 hours. ABG No results for input(s): PHART, HCO3 in the last 72 hours.  Invalid input(s): PCO2, PO2  Studies/Results: No results found.  Anti-infectives: Anti-infectives (From admission, onward)    Start     Dose/Rate Route Frequency Ordered Stop   10/05/21 1000  piperacillin-tazobactam (ZOSYN) IVPB 3.375 g        3.375 g 12.5 mL/hr over 240 Minutes Intravenous Every 8 hours 10/05/21 0848     10/04/21 1500  cefTRIAXone (ROCEPHIN) 2 g in sodium chloride 0.9 % 100 mL IVPB  Status:  Discontinued       See Hyperspace for full Linked Orders Report.   2 g 200 mL/hr over 30 Minutes Intravenous Daily 10/04/21 1022 10/05/21 0848   10/04/21 1500  metroNIDAZOLE (FLAGYL) IVPB 500 mg  Status:  Discontinued       See Hyperspace for full Linked Orders  Report.   500 mg 100 mL/hr over 60 Minutes Intravenous 2 times daily 10/04/21 1022 10/05/21 0848   10/04/21 0900  piperacillin-tazobactam (ZOSYN) IVPB 3.375 g        3.375 g 100 mL/hr over 30 Minutes Intravenous  Once 10/04/21 0850 10/04/21 0934       Assessment/Plan: POD#5 S/p laparoscopic appendectomy for Perforated gangrenous appendicitis - Persistent ileus, but has started passing flatus and NG tube output slowing down. Continue NGT to LIWS for now, will recheck later today for possible clamping trial - monitor JP drainage - currently serous - continue IV abx - WBC up to 14.3 from 11.  Will obtain CT scan to rule out intraabdominal abscess - mobilize - replete K - add IV tylenol for better pain control and to limit narcotic use   ID - zosyn 10/6>> FEN - change    LOS: 4 days    Ralene Ok 10/09/2021

## 2021-10-09 NOTE — Progress Notes (Signed)
Patient ID: Joshua Cooper, male   DOB: June 02, 1976, 45 y.o.   MRN: [id number] Request received for CT-guided drainage of postop abdominal abscess in patient.  Latest imaging studies were reviewed by Dr. Kathlene Cote.  Currently there is no percutaneous window for drainage of the collection.  Continue current treatment, antibiotics and obtain follow-up imaging at surgeon's discretion.

## 2021-10-09 NOTE — Progress Notes (Signed)
Mobility Specialist - Progress Note    10/09/21 1428  Mobility  Activity Ambulated in hall  Level of Assistance Modified independent, requires aide device or extra time  Assistive Device Other (Comment) (IV Pole)  Distance Ambulated (ft) 600 ft  Mobility Ambulated independently in hallway  Mobility Response Tolerated well  Mobility performed by Mobility specialist  $Mobility charge 1 Mobility   Pt is very pleasant and was agreeable to ambulate. NGT clamped by RN. Pt pushed IV pole while ambulating 600 ft in hallway and did not complain of pain, dizziness, or SOB. Pt returned to room after session and was left in recliner with call bell at side and awaiting RN for medication.  [place] Specialist Acute Rehabilitation Services Phone: [phone] 10/09/21, 2:31 PM

## 2021-10-10 ENCOUNTER — Inpatient Hospital Stay (HOSPITAL_COMMUNITY): Payer: Self-pay

## 2021-10-10 DIAGNOSIS — R609 Edema, unspecified: Secondary | ICD-10-CM

## 2021-10-10 LAB — CBC
HCT: 29.5 % — ABNORMAL LOW (ref 39.0–52.0)
Hemoglobin: 10 g/dL — ABNORMAL LOW (ref 13.0–17.0)
MCH: 30.3 pg (ref 26.0–34.0)
MCHC: 33.9 g/dL (ref 30.0–36.0)
MCV: 89.4 fL (ref 80.0–100.0)
Platelets: 333 10*3/uL (ref 150–400)
RBC: 3.3 MIL/uL — ABNORMAL LOW (ref 4.22–5.81)
RDW: 13.8 % (ref 11.5–15.5)
WBC: 18.4 10*3/uL — ABNORMAL HIGH (ref 4.0–10.5)
nRBC: 0 % (ref 0.0–0.2)

## 2021-10-10 LAB — BASIC METABOLIC PANEL
Anion gap: 8 (ref 5–15)
BUN: <5 mg/dL — ABNORMAL LOW (ref 6–20)
CO2: 26 mmol/L (ref 22–32)
Calcium: 8.2 mg/dL — ABNORMAL LOW (ref 8.9–10.3)
Chloride: 101 mmol/L (ref 98–111)
Creatinine, Ser: 0.49 mg/dL — ABNORMAL LOW (ref 0.61–1.24)
GFR, Estimated: >60 mL/min (ref >60)
Glucose, Bld: 112 mg/dL — ABNORMAL HIGH (ref 70–99)
Potassium: 3.7 mmol/L (ref 3.5–5.1)
Sodium: 135 mmol/L (ref 135–145)

## 2021-10-10 LAB — MAGNESIUM: Magnesium: 1.8 mg/dL (ref 1.7–2.4)

## 2021-10-10 MED ORDER — ACETAMINOPHEN 325 MG PO TABS
650.0000 mg | ORAL_TABLET | Freq: Four times a day (QID) | ORAL | Status: DC
  Administered 2021-10-10 – 2021-10-11 (×6): 650 mg via ORAL
  Filled 2021-10-10 (×7): qty 2

## 2021-10-10 MED ORDER — METHOCARBAMOL 1000 MG/10ML IJ SOLN
500.0000 mg | Freq: Four times a day (QID) | INTRAVENOUS | Status: DC
  Administered 2021-10-10 – 2021-10-11 (×4): 500 mg via INTRAVENOUS
  Filled 2021-10-10: qty 500
  Filled 2021-10-10: qty 5
  Filled 2021-10-10: qty 500

## 2021-10-10 MED ORDER — HYDROMORPHONE HCL 1 MG/ML IJ SOLN
1.0000 mg | INTRAMUSCULAR | Status: DC | PRN
  Administered 2021-10-10 – 2021-10-11 (×3): 1 mg via INTRAVENOUS
  Administered 2021-10-12: 2 mg via INTRAVENOUS
  Filled 2021-10-10 (×3): qty 1
  Filled 2021-10-10: qty 2
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
## 2021-10-10 NOTE — Progress Notes (Signed)
Patient ID: Caydin Yeatts, male   DOB: 15-Mar-1976, 45 y.o.   MRN: [id number] [facility] Surgery Progress Note  6 Days Post-Op  Subjective: CC-  Continues to have intermittent abdominal pain and bloating. Continues to pass flatus, BM last night and this morning.  Objective: Vital signs in last 24 hours: Temp:  [97.8 F (36.6 C)-98.3 F (36.8 C)] 97.8 F (36.6 C) (10/12 0526) Pulse Rate:  [66-81] 66 (10/12 0526) Resp:  [16-19] 19 (10/12 0526) BP: (145-155)/(95-109) 151/95 (10/12 0526) SpO2:  [97 %-98 %] 97 % (10/12 0526) Last BM Date: 10/09/21  Intake/Output from previous day: 10/11 0701 - 10/12 0700 In: 2660.9 [P.O.:240; I.V.:1575; IV Piggyback:845.9] Out: 1642.5 [Urine:1425; Emesis/NG output:200; Drains:17.5] Intake/Output this shift: No intake/output data recorded.  PE: Gen:  Alert, NAD, pleasant Pulm:  rate and effort normal Abd: distended but soft, few BS heard, incisions cdi, drain with serous fluid in bulb  Lab Results:  Recent Labs    10/09/21 0401 10/10/21 0431  WBC 14.3* 18.4*  HGB 9.8* 10.0*  HCT 28.5* 29.5*  PLT 297 333   BMET Recent Labs    10/09/21 0401 10/10/21 0431  NA 132* 135  K 3.2* 3.7  CL 99 101  CO2 26 26  GLUCOSE 112* 112*  BUN 9 <5*  CREATININE 0.44* 0.49*  CALCIUM 7.8* 8.2*   PT/INR Recent Labs    10/09/21 1652  LABPROT 13.4  INR 1.0   CMP     Component Value Date/Time   NA 135 10/10/2021 0431   K 3.7 10/10/2021 0431   CL 101 10/10/2021 0431   CO2 26 10/10/2021 0431   GLUCOSE 112 (H) 10/10/2021 0431   BUN <5 (L) 10/10/2021 0431   CREATININE 0.49 (L) 10/10/2021 0431   CALCIUM 8.2 (L) 10/10/2021 0431   PROT 6.7 10/04/2021 0621   ALBUMIN 3.5 10/04/2021 0621   AST 22 10/04/2021 0621   ALT 14 10/04/2021 0621   ALKPHOS 64 10/04/2021 0621   BILITOT 1.7 (H) 10/04/2021 0621   GFRNONAA >60 10/10/2021 0431   Lipase     Component Value Date/Time   LIPASE 24 10/04/2021 0621       Studies/Results: CT ABDOMEN  PELVIS W CONTRAST  Result Date: 10/09/2021 CLINICAL DATA:  Laparoscopic appendectomy for perforated appendicitis EXAM: CT ABDOMEN AND PELVIS WITH CONTRAST TECHNIQUE: Multidetector CT imaging of the abdomen and pelvis was performed using the standard protocol following bolus administration of intravenous contrast. CONTRAST:  37mL OMNIPAQUE IOHEXOL 350 MG/ML SOLN COMPARISON:  10/04/2021 FINDINGS: Lower chest: Scattered atelectasis within the lower lobes. No airspace disease or effusion. Hepatobiliary: Stable hepatic steatosis. The gallbladder is moderately distended without cholelithiasis or cholecystitis. Pancreas: Unremarkable. No pancreatic ductal dilatation or surrounding inflammatory changes. Spleen: Normal in size without focal abnormality. Adrenals/Urinary Tract: Stable indeterminate 0.7 cm lesion off the upper pole left kidney. Small right renal cyst unchanged. No urinary tract calculi or obstructive uropathy. The adrenals and bladder are normal. Stomach/Bowel: Postsurgical changes are seen from appendectomy. Enteric catheter within the gastric lumen. Dilated loops of proximal small bowel are seen measuring up to 4 cm in diameter. Decreased caliber of the distal jejunum and ileum. Findings are suspicious for small-bowel obstruction, likely due to adhesions. Vascular/Lymphatic: Aortic atherosclerosis. No enlarged abdominal or pelvic lymph nodes. Reproductive: Prostate is unremarkable. Other: Localized fluid within the central upper pelvis reference image 65 measuring 7.3 x 2.0 cm. There is faint peripheral enhancement, this may reflect developing abscess. No free intraperitoneal gas. Surgical drain enters the  abdomen in the left lower quadrant, extending across the pelvis with tip in the right paracolic gutter. Musculoskeletal: No acute or destructive bony lesions. Reconstructed images demonstrate no additional findings. IMPRESSION: 1. Postsurgical changes from appendectomy. 2. Rim enhancing fluid collection  within the central upper pelvis, suspicious for developing abscess. 3. Small-bowel obstruction, transition in the region of the distal jejunum and ileum likely due to adhesions. 4. Hepatic steatosis. 5. Indeterminate 0.7 cm left frontal mass, for which dedicated nonemergent renal MRI follow-up recommended. 6.  Aortic Atherosclerosis (ICD10-I70.0). Electronically Signed   By: Randa Ngo M.D.   On: 10/09/2021 15:07    Anti-infectives: Anti-infectives (From admission, onward)    Start     Dose/Rate Route Frequency Ordered Stop   10/05/21 1000  piperacillin-tazobactam (ZOSYN) IVPB 3.375 g        3.375 g 12.5 mL/hr over 240 Minutes Intravenous Every 8 hours 10/05/21 0848     10/04/21 1500  cefTRIAXone (ROCEPHIN) 2 g in sodium chloride 0.9 % 100 mL IVPB  Status:  Discontinued       See Hyperspace for full Linked Orders Report.   2 g 200 mL/hr over 30 Minutes Intravenous Daily 10/04/21 1022 10/05/21 0848   10/04/21 1500  metroNIDAZOLE (FLAGYL) IVPB 500 mg  Status:  Discontinued       See Hyperspace for full Linked Orders Report.   500 mg 100 mL/hr over 60 Minutes Intravenous 2 times daily 10/04/21 1022 10/05/21 0848   10/04/21 0900  piperacillin-tazobactam (ZOSYN) IVPB 3.375 g        3.375 g 100 mL/hr over 30 Minutes Intravenous  Once 10/04/21 0850 10/04/21 0934        Assessment/Plan POD#5 S/p laparoscopic appendectomy for Perforated gangrenous appendicitis - CT 10/11 reports SBO, developing abscess in the central upper pelvis >> reviewed with IR, unable to drain - continue IV zosyn - WBC continues to rise, will check LE u/s to rule out DVT - patient has tolerated 24 hours clamping trial and continues to have bowel function. D/c NG tube today, advance to FLD - continue JP drain - serous - schedule IV robaxin and oral tylenol for better pain control   ID - zosyn 10/6>> FEN - IVF, FLD VTE - SCDs, lovenox Foley - none   LOS: 5 days    Wellington Hampshire, [facility]  Surgery 10/10/2021, 10:22 AM Please see Amion for pager number during day hours 7:00am-4:30pm

## 2021-10-10 NOTE — Progress Notes (Signed)
Bilateral lower extremity venous duplex has been completed. Preliminary results can be found in CV Proc through chart review.   10/10/21 10:54 AM Joshua Cooper RVT

## 2021-10-10 NOTE — Progress Notes (Signed)
Pt has slept most of the day. Encouraged pt several times throughout the day to ambulate and sit up in the recliner. Pt states he doesn't feel like it and will "walk later."

## 2021-10-11 LAB — BASIC METABOLIC PANEL
Anion gap: 7 (ref 5–15)
BUN: <5 mg/dL — ABNORMAL LOW (ref 6–20)
CO2: 23 mmol/L (ref 22–32)
Calcium: 8.6 mg/dL — ABNORMAL LOW (ref 8.9–10.3)
Chloride: 109 mmol/L (ref 98–111)
Creatinine, Ser: 0.41 mg/dL — ABNORMAL LOW (ref 0.61–1.24)
GFR, Estimated: >60 mL/min (ref >60)
Glucose, Bld: 104 mg/dL — ABNORMAL HIGH (ref 70–99)
Potassium: 4 mmol/L (ref 3.5–5.1)
Sodium: 139 mmol/L (ref 135–145)

## 2021-10-11 LAB — CBC
HCT: 28.6 % — ABNORMAL LOW (ref 39.0–52.0)
Hemoglobin: 9.7 g/dL — ABNORMAL LOW (ref 13.0–17.0)
MCH: 30.4 pg (ref 26.0–34.0)
MCHC: 33.9 g/dL (ref 30.0–36.0)
MCV: 89.7 fL (ref 80.0–100.0)
Platelets: 412 10*3/uL — ABNORMAL HIGH (ref 150–400)
RBC: 3.19 MIL/uL — ABNORMAL LOW (ref 4.22–5.81)
RDW: 14 % (ref 11.5–15.5)
WBC: 15.1 10*3/uL — ABNORMAL HIGH (ref 4.0–10.5)
nRBC: 0 % (ref 0.0–0.2)

## 2021-10-11 LAB — MAGNESIUM: Magnesium: 1.8 mg/dL (ref 1.7–2.4)

## 2021-10-11 MED ORDER — METHOCARBAMOL 500 MG PO TABS
500.0000 mg | ORAL_TABLET | Freq: Four times a day (QID) | ORAL | Status: DC
  Administered 2021-10-11 – 2021-10-12 (×3): 500 mg via ORAL
  Filled 2021-10-11 (×3): qty 1

## 2021-10-11 MED ORDER — OXYCODONE-ACETAMINOPHEN 5-325 MG PO TABS
1.0000 | ORAL_TABLET | ORAL | Status: DC | PRN
Start: 2021-10-11 — End: 2021-10-12
  Administered 2021-10-11 – 2021-10-12 (×6): 1 via ORAL
  Filled 2021-10-11 (×7): qty 1

## 2021-10-11 NOTE — Discharge Instructions (Addendum)
[facility]  Please arrive at least 30 min before your appointment to complete your check in paperwork.  If you are unable to arrive 30 min prior to your appointment time we may have to cancel or reschedule you. LAPAROSCOPIC SURGERY: POST OP INSTRUCTIONS Always review your discharge instruction sheet given to you by the facility where your surgery was performed. IF YOU HAVE DISABILITY OR FAMILY LEAVE FORMS, YOU MUST BRING THEM TO THE OFFICE FOR PROCESSING.   DO NOT GIVE THEM TO YOUR DOCTOR.  PAIN CONTROL  First take acetaminophen (Tylenol) AND/or ibuprofen (Advil) to control your pain after surgery.  Follow directions on package.  Taking acetaminophen (Tylenol) and/or ibuprofen (Advil) regularly after surgery will help to control your pain and lower the amount of prescription pain medication you may need.  You should not take more than 4,000 mg (4 grams) of acetaminophen (Tylenol) in 24 hours.  You should not take ibuprofen (Advil), aleve, motrin, naprosyn or other NSAIDS if you have a history of stomach ulcers or chronic kidney disease.  A prescription for pain medication may be given to you upon discharge.  Take your pain medication as prescribed, if you still have uncontrolled pain after taking acetaminophen (Tylenol) or ibuprofen (Advil). Use ice packs to help control pain. If you need a refill on your pain medication, please contact your pharmacy.  They will contact our office to request authorization. Prescriptions will not be filled after 5pm or on week-ends.  HOME MEDICATIONS Take your usually prescribed medications unless otherwise directed.  DIET You should follow a light diet the first few days after arrival home.  Be sure to include lots of fluids daily. Avoid fatty, fried foods.   CONSTIPATION It is common to experience some constipation after surgery and if you are taking pain medication.  Increasing fluid intake and taking a stool softener (such as Colace)  will usually help or prevent this problem from occurring.  A mild laxative (Milk of Magnesia or Miralax) should be taken according to package instructions if there are no bowel movements after 48 hours.  WOUND/INCISION CARE Most patients will experience some swelling and bruising in the area of the incisions.  Ice packs will help.  Swelling and bruising can take several days to resolve.  Unless discharge instructions indicate otherwise, follow guidelines below  STERI-STRIPS - you may remove your outer bandages 48 hours after surgery, and you may shower at that time.  You have steri-strips (small skin tapes) in place directly over the incision.  These strips should be left on the skin for 7-10 days.   DERMABOND/SKIN GLUE - you may shower in 24 hours.  The glue will flake off over the next 2-3 weeks. Any sutures or staples will be removed at the office during your follow-up visit.  ACTIVITIES You may resume regular (light) daily activities beginning the next day--such as daily self-care, walking, climbing stairs--gradually increasing activities as tolerated.  You may have sexual intercourse when it is comfortable.  Refrain from any heavy lifting or straining until approved by your doctor. You may drive when you are no longer taking prescription pain medication, you can comfortably wear a seatbelt, and you can safely maneuver your car and apply brakes.  FOLLOW-UP You should see your doctor in the office for a follow-up appointment approximately 2-3 weeks after your surgery.  You should have been given your post-op/follow-up appointment when your surgery was scheduled.  If you did not receive a post-op/follow-up appointment, make sure   that you call for this appointment within a day or two after you arrive home to insure a convenient appointment time.  OTHER INSTRUCTIONS  WHEN TO CALL YOUR DOCTOR: Fever over 101.0 Inability to urinate Continued bleeding from incision. Increased pain, redness, or  drainage from the incision. Increasing abdominal pain  The clinic staff is available to answer your questions during regular business hours.  Please don't hesitate to call and ask to speak to one of the nurses for clinical concerns.  If you have a medical emergency, go to the nearest emergency room or call 911.  A surgeon from [facility] is always on call at the hospital. [address] [place], [place], [place]  [address] ? [address], [place], [place]   [address] [phone] ? [phone] ? FAX [phone]  You will need repeat imaging to evaluate the small left renal lesion on CT that is recomended to have a MRI for follow up. Please establish care with a primary care provider for this.
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
## 2021-10-11 NOTE — Progress Notes (Signed)
7 Days Post-Op   Subjective/Chief Complaint: Doing well tol Po  +BMs   Objective: Vital signs in last 24 hours: Temp:  [98 F (36.7 C)-98.5 F (36.9 C)] 98 F (36.7 C) (10/12 2030) Pulse Rate:  [73-79] 73 (10/12 2030) Resp:  [18] 18 (10/12 2030) BP: (142-153)/(96-97) 142/96 (10/12 2030) SpO2:  [98 %-100 %] 98 % (10/12 2030) Last BM Date: 10/10/21  Intake/Output from previous day: 10/12 0701 - 10/13 0700 In: 2231.2 [P.O.:840; I.V.:1200; IV Piggyback:191.2] Out: 20 [Drains:20] Intake/Output this shift: No intake/output data recorded.  GI: soft, non-tender; bowel sounds normal; no masses,  no organomegaly and non distended, no rebound/guarding  Lab Results:  Recent Labs    10/10/21 0431 10/11/21 0357  WBC 18.4* 15.1*  HGB 10.0* 9.7*  HCT 29.5* 28.6*  PLT 333 412*   BMET Recent Labs    10/10/21 0431 10/11/21 0357  NA 135 139  K 3.7 4.0  CL 101 109  CO2 26 23  GLUCOSE 112* 104*  BUN <5* <5*  CREATININE 0.49* 0.41*  CALCIUM 8.2* 8.6*   PT/INR Recent Labs    10/09/21 1652  LABPROT 13.4  INR 1.0   ABG No results for input(s): PHART, HCO3 in the last 72 hours.  Invalid input(s): PCO2, PO2  Studies/Results: CT ABDOMEN PELVIS W CONTRAST  Result Date: 10/09/2021 CLINICAL DATA:  Laparoscopic appendectomy for perforated appendicitis EXAM: CT ABDOMEN AND PELVIS WITH CONTRAST TECHNIQUE: Multidetector CT imaging of the abdomen and pelvis was performed using the standard protocol following bolus administration of intravenous contrast. CONTRAST:  38mL OMNIPAQUE IOHEXOL 350 MG/ML SOLN COMPARISON:  10/04/2021 FINDINGS: Lower chest: Scattered atelectasis within the lower lobes. No airspace disease or effusion. Hepatobiliary: Stable hepatic steatosis. The gallbladder is moderately distended without cholelithiasis or cholecystitis. Pancreas: Unremarkable. No pancreatic ductal dilatation or surrounding inflammatory changes. Spleen: Normal in size without focal abnormality.  Adrenals/Urinary Tract: Stable indeterminate 0.7 cm lesion off the upper pole left kidney. Small right renal cyst unchanged. No urinary tract calculi or obstructive uropathy. The adrenals and bladder are normal. Stomach/Bowel: Postsurgical changes are seen from appendectomy. Enteric catheter within the gastric lumen. Dilated loops of proximal small bowel are seen measuring up to 4 cm in diameter. Decreased caliber of the distal jejunum and ileum. Findings are suspicious for small-bowel obstruction, likely due to adhesions. Vascular/Lymphatic: Aortic atherosclerosis. No enlarged abdominal or pelvic lymph nodes. Reproductive: Prostate is unremarkable. Other: Localized fluid within the central upper pelvis reference image 65 measuring 7.3 x 2.0 cm. There is faint peripheral enhancement, this may reflect developing abscess. No free intraperitoneal gas. Surgical drain enters the abdomen in the left lower quadrant, extending across the pelvis with tip in the right paracolic gutter. Musculoskeletal: No acute or destructive bony lesions. Reconstructed images demonstrate no additional findings. IMPRESSION: 1. Postsurgical changes from appendectomy. 2. Rim enhancing fluid collection within the central upper pelvis, suspicious for developing abscess. 3. Small-bowel obstruction, transition in the region of the distal jejunum and ileum likely due to adhesions. 4. Hepatic steatosis. 5. Indeterminate 0.7 cm left frontal mass, for which dedicated nonemergent renal MRI follow-up recommended. 6.  Aortic Atherosclerosis (ICD10-I70.0). Electronically Signed   By: Randa Ngo M.D.   On: 10/09/2021 15:07   VAS [place] LOWER EXTREMITY VENOUS (DVT)  Result Date: 10/10/2021  Lower Venous DVT Study [name]  Date of Exam:   10/10/2021 Medical Rec #: [id number]         Accession #:    [id number] Date of  Birth: 1976/10/16          Patient Gender: M Patient Age:   45 years Exam Location:  [facility] Procedure:       VAS [place] LOWER EXTREMITY VENOUS (DVT) Referring Phys: Margie Billet --------------------------------------------------------------------------------  Indications: Edema.  Risk Factors: None identified. Comparison Study: No prior studies. Performing Technologist: Oliver Hum RVT  Examination Guidelines: A complete evaluation includes B-mode imaging, spectral Doppler, color Doppler, and power Doppler as needed of all accessible portions of each vessel. Bilateral testing is considered an integral part of a complete examination. Limited examinations for reoccurring indications may be performed as noted. The reflux portion of the exam is performed with the patient in reverse Trendelenburg.  +---------+---------------+---------+-----------+----------+--------------+ RIGHT    CompressibilityPhasicitySpontaneityPropertiesThrombus Aging +---------+---------------+---------+-----------+----------+--------------+ CFV      Full           Yes      Yes                                 +---------+---------------+---------+-----------+----------+--------------+ SFJ      Full                                                        +---------+---------------+---------+-----------+----------+--------------+ FV Prox  Full                                                        +---------+---------------+---------+-----------+----------+--------------+ FV Mid   Full                                                        +---------+---------------+---------+-----------+----------+--------------+ FV DistalFull                                                        +---------+---------------+---------+-----------+----------+--------------+ PFV      Full                                                        +---------+---------------+---------+-----------+----------+--------------+ POP      Full           Yes      Yes                                  +---------+---------------+---------+-----------+----------+--------------+ PTV      Full                                                        +---------+---------------+---------+-----------+----------+--------------+  PERO     Full                                                        +---------+---------------+---------+-----------+----------+--------------+   +---------+---------------+---------+-----------+----------+--------------+ LEFT     CompressibilityPhasicitySpontaneityPropertiesThrombus Aging +---------+---------------+---------+-----------+----------+--------------+ CFV      Full           Yes      Yes                                 +---------+---------------+---------+-----------+----------+--------------+ SFJ      Full                                                        +---------+---------------+---------+-----------+----------+--------------+ FV Prox  Full                                                        +---------+---------------+---------+-----------+----------+--------------+ FV Mid   Full                                                        +---------+---------------+---------+-----------+----------+--------------+ FV DistalFull                                                        +---------+---------------+---------+-----------+----------+--------------+ PFV      Full                                                        +---------+---------------+---------+-----------+----------+--------------+ POP      Full           Yes      Yes                                 +---------+---------------+---------+-----------+----------+--------------+ PTV      Full                                                        +---------+---------------+---------+-----------+----------+--------------+ PERO     Full                                                         +---------+---------------+---------+-----------+----------+--------------+  Summary: RIGHT: - There is no evidence of deep vein thrombosis in the lower extremity.  - No cystic structure found in the popliteal fossa.  LEFT: - There is no evidence of deep vein thrombosis in the lower extremity.  - No cystic structure found in the popliteal fossa.  *See table(s) above for measurements and observations. Electronically signed by Harold Barban MD on 10/10/2021 at 9:25:37 PM.    Final     Anti-infectives: Anti-infectives (From admission, onward)    Start     Dose/Rate Route Frequency Ordered Stop   10/05/21 1000  piperacillin-tazobactam (ZOSYN) IVPB 3.375 g        3.375 g 12.5 mL/hr over 240 Minutes Intravenous Every 8 hours 10/05/21 0848     10/04/21 1500  cefTRIAXone (ROCEPHIN) 2 g in sodium chloride 0.9 % 100 mL IVPB  Status:  Discontinued       See Hyperspace for full Linked Orders Report.   2 g 200 mL/hr over 30 Minutes Intravenous Daily 10/04/21 1022 10/05/21 0848   10/04/21 1500  metroNIDAZOLE (FLAGYL) IVPB 500 mg  Status:  Discontinued       See Hyperspace for full Linked Orders Report.   500 mg 100 mL/hr over 60 Minutes Intravenous 2 times daily 10/04/21 1022 10/05/21 0848   10/04/21 0900  piperacillin-tazobactam (ZOSYN) IVPB 3.375 g        3.375 g 100 mL/hr over 30 Minutes Intravenous  Once 10/04/21 0850 10/04/21 0934       Assessment/Plan: POD#6 S/p laparoscopic appendectomy for Perforated gangrenous appendicitis - CT 10/11 reports SBO, developing abscess in the central upper pelvis >> reviewed with IR, unable to drain - continue IV zosyn -leukocytosis declining - adv diet as tol - continue JP drain - serous - PO pain rx   ID - zosyn 10/6>> FEN - IVF, FLD VTE - SCDs, lovenox Foley - none  Dispo: DC likely Fri with PO abx if con't to do well  LOS: 6 days    Ralene Ok 10/11/2021

## 2021-10-12 LAB — MAGNESIUM: Magnesium: 1.6 mg/dL — ABNORMAL LOW (ref 1.7–2.4)

## 2021-10-12 LAB — CBC
HCT: 29.5 % — ABNORMAL LOW (ref 39.0–52.0)
Hemoglobin: 10.1 g/dL — ABNORMAL LOW (ref 13.0–17.0)
MCH: 30.1 pg (ref 26.0–34.0)
MCHC: 34.2 g/dL (ref 30.0–36.0)
MCV: 87.8 fL (ref 80.0–100.0)
Platelets: 486 10*3/uL — ABNORMAL HIGH (ref 150–400)
RBC: 3.36 MIL/uL — ABNORMAL LOW (ref 4.22–5.81)
RDW: 14 % (ref 11.5–15.5)
WBC: 18.7 10*3/uL — ABNORMAL HIGH (ref 4.0–10.5)
nRBC: 0 % (ref 0.0–0.2)

## 2021-10-12 LAB — BASIC METABOLIC PANEL
Anion gap: 8 (ref 5–15)
BUN: <5 mg/dL — ABNORMAL LOW (ref 6–20)
CO2: 26 mmol/L (ref 22–32)
Calcium: 8.4 mg/dL — ABNORMAL LOW (ref 8.9–10.3)
Chloride: 99 mmol/L (ref 98–111)
Creatinine, Ser: 0.62 mg/dL (ref 0.61–1.24)
GFR, Estimated: >60 mL/min (ref >60)
Glucose, Bld: 92 mg/dL (ref 70–99)
Potassium: 4 mmol/L (ref 3.5–5.1)
Sodium: 133 mmol/L — ABNORMAL LOW (ref 135–145)

## 2021-10-12 MED ORDER — PANTOPRAZOLE SODIUM 40 MG IV SOLR
40.0000 mg | INTRAVENOUS | Status: DC
  Administered 2021-10-13 – 2021-10-17 (×5): 40 mg via INTRAVENOUS
  Filled 2021-10-12 (×5): qty 40

## 2021-10-12 MED ORDER — BOOST / RESOURCE BREEZE PO LIQD CUSTOM
1.0000 | Freq: Two times a day (BID) | ORAL | Status: DC
  Administered 2021-10-12 – 2021-10-16 (×5): 1 via ORAL

## 2021-10-12 MED ORDER — MAGNESIUM SULFATE 50 % IJ SOLN: 1.0000 g | Freq: Once | INTRAMUSCULAR | Status: DC

## 2021-10-12 MED ORDER — ACETAMINOPHEN 325 MG PO TABS: 650.0000 mg | ORAL_TABLET | Freq: Four times a day (QID) | ORAL | Status: DC | PRN

## 2021-10-12 MED ORDER — OXYCODONE-ACETAMINOPHEN 5-325 MG PO TABS
1.0000 | ORAL_TABLET | ORAL | Status: DC | PRN
  Administered 2021-10-12: 1 via ORAL
  Administered 2021-10-12 – 2021-10-15 (×14): 2 via ORAL
  Administered 2021-10-15: 1 via ORAL
  Administered 2021-10-15 – 2021-10-16 (×3): 2 via ORAL
  Filled 2021-10-12 (×5): qty 2
  Filled 2021-10-12: qty 1
  Filled 2021-10-12 (×13): qty 2

## 2021-10-12 MED ORDER — HYDROMORPHONE HCL 1 MG/ML IJ SOLN: 1.0000 mg | INTRAMUSCULAR | Status: DC | PRN

## 2021-10-12 MED ORDER — POLYETHYLENE GLYCOL 3350 17 G PO PACK: 17.0000 g | PACK | Freq: Every day | ORAL | Status: DC | PRN

## 2021-10-12 MED ORDER — MAGNESIUM SULFATE IN D5W 1-5 GM/100ML-% IV SOLN
1.0000 g | Freq: Once | INTRAVENOUS | Status: AC
  Administered 2021-10-12: 1 g via INTRAVENOUS
  Filled 2021-10-12: qty 100

## 2021-10-12 MED ORDER — HYDROMORPHONE HCL 1 MG/ML IJ SOLN
1.0000 mg | INTRAMUSCULAR | Status: DC | PRN
  Administered 2021-10-12 – 2021-10-17 (×16): 1 mg via INTRAVENOUS
  Filled 2021-10-12 (×16): qty 1

## 2021-10-12 MED ORDER — METHOCARBAMOL 500 MG PO TABS
750.0000 mg | ORAL_TABLET | Freq: Four times a day (QID) | ORAL | Status: DC
  Administered 2021-10-12 – 2021-10-17 (×17): 750 mg via ORAL
  Filled 2021-10-12 (×18): qty 2

## 2021-10-12 MED ORDER — IBUPROFEN 400 MG PO TABS
600.0000 mg | ORAL_TABLET | Freq: Four times a day (QID) | ORAL | Status: DC | PRN
  Administered 2021-10-14 – 2021-10-17 (×3): 600 mg via ORAL
  Filled 2021-10-12 (×3): qty 1

## 2021-10-12 MED ORDER — DOCUSATE SODIUM 100 MG PO CAPS
100.0000 mg | ORAL_CAPSULE | Freq: Two times a day (BID) | ORAL | Status: DC
  Administered 2021-10-13 – 2021-10-18 (×10): 100 mg via ORAL
  Filled 2021-10-12 (×11): qty 1

## 2021-10-12 NOTE — Progress Notes (Signed)
Patient ID: Joshua Cooper, male   DOB: Jun 09, 1976, 45 y.o.   MRN: [id number] [facility] Progress Note  8 Days Post-Op  Subjective: CC-  Continues to complain of abdominal pain and bloating. Passing some flatus and had a loose BM yesterday. Tolerating diet but cannot take in much because he gets bloated. Complaining of pain when he ambulates.   Objective: Vital signs in last 24 hours: Temp:  [98.1 F (36.7 C)-98.7 F (37.1 C)] 98.1 F (36.7 C) (10/14 0643) Pulse Rate:  [68-70] 68 (10/14 0643) Resp:  [18-19] 18 (10/14 0643) BP: (149-167)/(93-101) 149/97 (10/14 0643) SpO2:  [97 %-100 %] 97 % (10/14 0643) Last BM Date: 10/10/21  Intake/Output from previous day: 10/13 0701 - 10/14 0700 In: 940 [P.O.:840; IV Piggyback:100] Out: 10 [Drains:10] Intake/Output this shift: Total I/O In: 240 [P.O.:240] Out: 15 [Drains:15]  PE: Gen:  Alert, NAD, pleasant Pulm:  rate and effort normal Abd: distended but soft, few BS heard, incisions cdi, drain with serous fluid in bulb   Lab Results:  Recent Labs    10/10/21 0431 10/11/21 0357  WBC 18.4* 15.1*  HGB 10.0* 9.7*  HCT 29.5* 28.6*  PLT 333 412*   BMET Recent Labs    10/10/21 0431 10/11/21 0357  NA 135 139  K 3.7 4.0  CL 101 109  CO2 26 23  GLUCOSE 112* 104*  BUN <5* <5*  CREATININE 0.49* 0.41*  CALCIUM 8.2* 8.6*   PT/INR Recent Labs    10/09/21 1652  LABPROT 13.4  INR 1.0   CMP     Component Value Date/Time   NA 139 10/11/2021 0357   K 4.0 10/11/2021 0357   CL 109 10/11/2021 0357   CO2 23 10/11/2021 0357   GLUCOSE 104 (H) 10/11/2021 0357   BUN <5 (L) 10/11/2021 0357   CREATININE 0.41 (L) 10/11/2021 0357   CALCIUM 8.6 (L) 10/11/2021 0357   PROT 6.7 10/04/2021 0621   ALBUMIN 3.5 10/04/2021 0621   AST 22 10/04/2021 0621   ALT 14 10/04/2021 0621   ALKPHOS 64 10/04/2021 0621   BILITOT 1.7 (H) 10/04/2021 0621   GFRNONAA >60 10/11/2021 0357   Lipase     Component Value Date/Time   LIPASE 24  10/04/2021 0621       Studies/Results: VAS [place] LOWER EXTREMITY VENOUS (DVT)  Result Date: 10/10/2021  Lower Venous DVT Study [name]  Date of Exam:   10/10/2021 Medical Rec #: [id number]         Accession #:    [id number] Date of Birth: 1976/09/11          Patient Gender: M Patient Age:   73 years Exam Location:  [facility] Procedure:      VAS [place] LOWER EXTREMITY VENOUS (DVT) Referring Phys: Margie Billet --------------------------------------------------------------------------------  Indications: Edema.  Risk Factors: None identified. Comparison Study: No prior studies. Performing Technologist: Oliver Hum RVT  Examination Guidelines: A complete evaluation includes B-mode imaging, spectral Doppler, color Doppler, and power Doppler as needed of all accessible portions of each vessel. Bilateral testing is considered an integral part of a complete examination. Limited examinations for reoccurring indications may be performed as noted. The reflux portion of the exam is performed with the patient in reverse Trendelenburg.  +---------+---------------+---------+-----------+----------+--------------+ RIGHT    CompressibilityPhasicitySpontaneityPropertiesThrombus Aging +---------+---------------+---------+-----------+----------+--------------+ CFV      Full           Yes      Yes                                 +---------+---------------+---------+-----------+----------+--------------+  SFJ      Full                                                        +---------+---------------+---------+-----------+----------+--------------+ FV Prox  Full                                                        +---------+---------------+---------+-----------+----------+--------------+ FV Mid   Full                                                        +---------+---------------+---------+-----------+----------+--------------+ FV DistalFull                                                         +---------+---------------+---------+-----------+----------+--------------+ PFV      Full                                                        +---------+---------------+---------+-----------+----------+--------------+ POP      Full           Yes      Yes                                 +---------+---------------+---------+-----------+----------+--------------+ PTV      Full                                                        +---------+---------------+---------+-----------+----------+--------------+ PERO     Full                                                        +---------+---------------+---------+-----------+----------+--------------+   +---------+---------------+---------+-----------+----------+--------------+ LEFT     CompressibilityPhasicitySpontaneityPropertiesThrombus Aging +---------+---------------+---------+-----------+----------+--------------+ CFV      Full           Yes      Yes                                 +---------+---------------+---------+-----------+----------+--------------+ SFJ      Full                                                        +---------+---------------+---------+-----------+----------+--------------+  FV Prox  Full                                                        +---------+---------------+---------+-----------+----------+--------------+ FV Mid   Full                                                        +---------+---------------+---------+-----------+----------+--------------+ FV DistalFull                                                        +---------+---------------+---------+-----------+----------+--------------+ PFV      Full                                                        +---------+---------------+---------+-----------+----------+--------------+ POP      Full           Yes      Yes                                  +---------+---------------+---------+-----------+----------+--------------+ PTV      Full                                                        +---------+---------------+---------+-----------+----------+--------------+ PERO     Full                                                        +---------+---------------+---------+-----------+----------+--------------+     Summary: RIGHT: - There is no evidence of deep vein thrombosis in the lower extremity.  - No cystic structure found in the popliteal fossa.  LEFT: - There is no evidence of deep vein thrombosis in the lower extremity.  - No cystic structure found in the popliteal fossa.  *See table(s) above for measurements and observations. Electronically signed by Harold Barban MD on 10/10/2021 at 9:25:37 PM.    Final     Anti-infectives: Anti-infectives (From admission, onward)    Start     Dose/Rate Route Frequency Ordered Stop   10/05/21 1000  piperacillin-tazobactam (ZOSYN) IVPB 3.375 g        3.375 g 12.5 mL/hr over 240 Minutes Intravenous Every 8 hours 10/05/21 0848     10/04/21 1500  cefTRIAXone (ROCEPHIN) 2 g in sodium chloride 0.9 % 100 mL IVPB  Status:  Discontinued       See Hyperspace for full Linked Orders Report.   2 g 200 mL/hr over 30 Minutes Intravenous  Daily 10/04/21 1022 10/05/21 0848   10/04/21 1500  metroNIDAZOLE (FLAGYL) IVPB 500 mg  Status:  Discontinued       See Hyperspace for full Linked Orders Report.   500 mg 100 mL/hr over 60 Minutes Intravenous 2 times daily 10/04/21 1022 10/05/21 0848   10/04/21 0900  piperacillin-tazobactam (ZOSYN) IVPB 3.375 g        3.375 g 100 mL/hr over 30 Minutes Intravenous  Once 10/04/21 0850 10/04/21 0934        Assessment/Plan POD#8 S/p laparoscopic appendectomy for Perforated gangrenous appendicitis 10/6 Dr. Marcello Moores - CT 10/11 reports SBO, developing abscess in the central upper pelvis >> reviewed with IR, unable to drain - continue IV zosyn. Will need to go home on  oral augmentin - having some bowel function but still getting bloated when he eats and cannot tolerate much. Ileus slowly improving. Soft diet as tolerated. Add colace and miralax - mobilize - increase robaxin dose and continue oral pain meds, limit IV narcotic - continue JP drain - serous. Likely can d/c at discharge - possibly home over the weekend if pain and ileus improve. Follow up info and discharge instruction on AVS   ID - zosyn 10/6>> FEN - IVF, soft diet VTE - SCDs, lovenox Foley - none   LOS: 7 days    Wellington Hampshire, [facility] 10/12/2021, 10:51 AM Please see Amion for pager number during day hours 7:00am-4:30pm

## 2021-10-12 NOTE — Progress Notes (Signed)
   10/12/21 1300  Mobility  Activity Refused mobility   Pt in and out of sleep while trying to speak with him about mobilizing. Will check back as schedule permits.    [place] Specialist Acute Rehab Services Office: [phone]

## 2021-10-13 LAB — BASIC METABOLIC PANEL
Anion gap: 9 (ref 5–15)
BUN: 7 mg/dL (ref 6–20)
CO2: 25 mmol/L (ref 22–32)
Calcium: 8.4 mg/dL — ABNORMAL LOW (ref 8.9–10.3)
Chloride: 100 mmol/L (ref 98–111)
Creatinine, Ser: 0.54 mg/dL — ABNORMAL LOW (ref 0.61–1.24)
GFR, Estimated: >60 mL/min (ref >60)
Glucose, Bld: 98 mg/dL (ref 70–99)
Potassium: 3.8 mmol/L (ref 3.5–5.1)
Sodium: 134 mmol/L — ABNORMAL LOW (ref 135–145)

## 2021-10-13 LAB — CBC
HCT: 31.6 % — ABNORMAL LOW (ref 39.0–52.0)
Hemoglobin: 10.5 g/dL — ABNORMAL LOW (ref 13.0–17.0)
MCH: 29.5 pg (ref 26.0–34.0)
MCHC: 33.2 g/dL (ref 30.0–36.0)
MCV: 88.8 fL (ref 80.0–100.0)
Platelets: 488 10*3/uL — ABNORMAL HIGH (ref 150–400)
RBC: 3.56 MIL/uL — ABNORMAL LOW (ref 4.22–5.81)
RDW: 14.3 % (ref 11.5–15.5)
WBC: 15 10*3/uL — ABNORMAL HIGH (ref 4.0–10.5)
nRBC: 0 % (ref 0.0–0.2)

## 2021-10-13 LAB — MAGNESIUM: Magnesium: 1.9 mg/dL (ref 1.7–2.4)

## 2021-10-13 MED ORDER — LIP MEDEX EX OINT
TOPICAL_OINTMENT | CUTANEOUS | Status: AC
  Filled 2021-10-13: qty 7

## 2021-10-13 NOTE — Progress Notes (Signed)
9 Days Post-Op   Subjective/Chief Complaint: Feels more bloated this morning Passing flatus but no BM last 24 hours   Objective: Vital signs in last 24 hours: Temp:  [97.7 F (36.5 C)-98.7 F (37.1 C)] 97.7 F (36.5 C) (10/15 0511) Pulse Rate:  [62-84] 62 (10/15 0511) Resp:  [15-18] 16 (10/15 0511) BP: (149-159)/(94-109) 149/94 (10/15 0511) SpO2:  [94 %-99 %] 99 % (10/15 0511) Last BM Date: 10/12/21  Intake/Output from previous day: 10/14 0701 - 10/15 0700 In: 2035.5 [P.O.:1560; IV Piggyback:475.5] Out: 28 [Urine:3; Drains:25] Intake/Output this shift: No intake/output data recorded.  Exam: Up, eating breakfast Abdomen mildly distended, minimally tender  Lab Results:  Recent Labs    10/12/21 1113 10/13/21 0439  WBC 18.7* 15.0*  HGB 10.1* 10.5*  HCT 29.5* 31.6*  PLT 486* 488*   BMET Recent Labs    10/12/21 1113 10/13/21 0439  NA 133* 134*  K 4.0 3.8  CL 99 100  CO2 26 25  GLUCOSE 92 98  BUN <5* 7  CREATININE 0.62 0.54*  CALCIUM 8.4* 8.4*   PT/INR No results for input(s): LABPROT, INR in the last 72 hours. ABG No results for input(s): PHART, HCO3 in the last 72 hours.  Invalid input(s): PCO2, PO2  Studies/Results: No results found.  Anti-infectives: Anti-infectives (From admission, onward)    Start     Dose/Rate Route Frequency Ordered Stop   10/05/21 1000  piperacillin-tazobactam (ZOSYN) IVPB 3.375 g        3.375 g 12.5 mL/hr over 240 Minutes Intravenous Every 8 hours 10/05/21 0848     10/04/21 1500  cefTRIAXone (ROCEPHIN) 2 g in sodium chloride 0.9 % 100 mL IVPB  Status:  Discontinued       See Hyperspace for full Linked Orders Report.   2 g 200 mL/hr over 30 Minutes Intravenous Daily 10/04/21 1022 10/05/21 0848   10/04/21 1500  metroNIDAZOLE (FLAGYL) IVPB 500 mg  Status:  Discontinued       See Hyperspace for full Linked Orders Report.   500 mg 100 mL/hr over 60 Minutes Intravenous 2 times daily 10/04/21 1022 10/05/21 0848   10/04/21  0900  piperacillin-tazobactam (ZOSYN) IVPB 3.375 g        3.375 g 100 mL/hr over 30 Minutes Intravenous  Once 10/04/21 0850 10/04/21 0934       Assessment/Plan: POD#9 S/p laparoscopic appendectomy for Perforated gangrenous appendicitis 10/6 Dr. Marcello Moores  WBC back down a little to 15K from 18K Still with ileus  Needs continued care in the hospital given distension and ileus Continue antibiotics Repeat CBC in the morning Joshua Cooper 10/13/2021

## 2021-10-13 NOTE — Progress Notes (Signed)
Mobility Specialist - Cancellation/Refusal Note    10/13/21 1431  Mobility  Activity Refused mobility   Upon entry pt stated having stomach pains and refused mobility. Pt requested to ambulate with family once he felt better.   [place] Specialist [facility] Phone: [phone] 10/13/21, 2:33 PM

## 2021-10-14 ENCOUNTER — Inpatient Hospital Stay: Payer: Self-pay

## 2021-10-14 LAB — CBC
HCT: 29.8 % — ABNORMAL LOW (ref 39.0–52.0)
Hemoglobin: 10 g/dL — ABNORMAL LOW (ref 13.0–17.0)
MCH: 29.8 pg (ref 26.0–34.0)
MCHC: 33.6 g/dL (ref 30.0–36.0)
MCV: 88.7 fL (ref 80.0–100.0)
Platelets: 576 10*3/uL — ABNORMAL HIGH (ref 150–400)
RBC: 3.36 MIL/uL — ABNORMAL LOW (ref 4.22–5.81)
RDW: 14.6 % (ref 11.5–15.5)
WBC: 14.7 10*3/uL — ABNORMAL HIGH (ref 4.0–10.5)
nRBC: 0 % (ref 0.0–0.2)

## 2021-10-14 NOTE — Progress Notes (Signed)
10 Days Post-Op   Subjective/Chief Complaint: Still with abdominal pain and bloating, but having flatus and BM's Wants to try a regular diet   Objective: Vital signs in last 24 hours: Temp:  [98.2 F (36.8 C)-98.6 F (37 C)] 98.2 F (36.8 C) (10/16 0538) Pulse Rate:  [70-73] 71 (10/16 0538) Resp:  [16-20] 16 (10/16 0538) BP: (138-149)/(96-109) 138/96 (10/16 0538) SpO2:  [98 %-99 %] 98 % (10/16 0538) Last BM Date: 10/13/21  Intake/Output from previous day: 10/15 0701 - 10/16 0700 In: 1650 [P.O.:1500; IV Piggyback:150] Out: 19 [Urine:3; Drains:15; Stool:1] Intake/Output this shift: No intake/output data recorded.  Exam: Awake and alert Abdomen distended, mildly tender, drain serosang  Lab Results:  Recent Labs    10/13/21 0439 10/14/21 0346  WBC 15.0* 14.7*  HGB 10.5* 10.0*  HCT 31.6* 29.8*  PLT 488* 576*   BMET Recent Labs    10/12/21 1113 10/13/21 0439  NA 133* 134*  K 4.0 3.8  CL 99 100  CO2 26 25  GLUCOSE 92 98  BUN <5* 7  CREATININE 0.62 0.54*  CALCIUM 8.4* 8.4*   PT/INR No results for input(s): LABPROT, INR in the last 72 hours. ABG No results for input(s): PHART, HCO3 in the last 72 hours.  Invalid input(s): PCO2, PO2  Studies/Results: No results found.  Anti-infectives: Anti-infectives (From admission, onward)    Start     Dose/Rate Route Frequency Ordered Stop   10/05/21 1000  piperacillin-tazobactam (ZOSYN) IVPB 3.375 g        3.375 g 12.5 mL/hr over 240 Minutes Intravenous Every 8 hours 10/05/21 0848     10/04/21 1500  cefTRIAXone (ROCEPHIN) 2 g in sodium chloride 0.9 % 100 mL IVPB  Status:  Discontinued       See Hyperspace for full Linked Orders Report.   2 g 200 mL/hr over 30 Minutes Intravenous Daily 10/04/21 1022 10/05/21 0848   10/04/21 1500  metroNIDAZOLE (FLAGYL) IVPB 500 mg  Status:  Discontinued       See Hyperspace for full Linked Orders Report.   500 mg 100 mL/hr over 60 Minutes Intravenous 2 times daily 10/04/21 1022  10/05/21 0848   10/04/21 0900  piperacillin-tazobactam (ZOSYN) IVPB 3.375 g        3.375 g 100 mL/hr over 30 Minutes Intravenous  Once 10/04/21 0850 10/04/21 0934       Assessment/Plan: POD#10 S/p laparoscopic appendectomy for Perforated gangrenous appendicitis 10/6 Dr. Marcello Moores  Given persistent elevated WBC and distension, will repeat a CT scan of the abdomen with contrast to re-evaluate his previous fluid collection to see if it looks more like an abscess or is resolving  Coralie Keens MD 10/14/2021

## 2021-10-14 NOTE — Progress Notes (Signed)
Spoke with Dr Ninfa Linden re PICC for pt due to multiple IV attempts throughout hospital stay and upcoming CT with contrast.  New order obtained for PICC placement if pt agreeable. Helene Kelp RN aware.

## 2021-10-14 NOTE — Progress Notes (Signed)
Pt ate a full cheeseburger, fries, tossed salad and a brownie for lunch. Also noted pt was using a straw. After lunch pt complains of bloating, distension and pain. Given prn meds, enc pt to walk, to sit up in chair as much as possible.

## 2021-10-14 NOTE — Progress Notes (Signed)
Pt refusing to get OOB this am, states he just wants to sleep. Reminded pt that being up to chair and walking halls is MD orders and is best for return of normal bowel functions.

## 2021-10-14 NOTE — Progress Notes (Signed)
Spoke with pt re PICC access.  Refused PICC placement at this time.  PIV started.

## 2021-10-15 ENCOUNTER — Encounter (HOSPITAL_COMMUNITY): Payer: Self-pay

## 2021-10-15 ENCOUNTER — Inpatient Hospital Stay (HOSPITAL_COMMUNITY): Payer: Self-pay

## 2021-10-15 LAB — CBC
HCT: 29 % — ABNORMAL LOW (ref 39.0–52.0)
Hemoglobin: 9.8 g/dL — ABNORMAL LOW (ref 13.0–17.0)
MCH: 30.5 pg (ref 26.0–34.0)
MCHC: 33.8 g/dL (ref 30.0–36.0)
MCV: 90.3 fL (ref 80.0–100.0)
Platelets: 639 10*3/uL — ABNORMAL HIGH (ref 150–400)
RBC: 3.21 MIL/uL — ABNORMAL LOW (ref 4.22–5.81)
RDW: 14.6 % (ref 11.5–15.5)
WBC: 20 10*3/uL — ABNORMAL HIGH (ref 4.0–10.5)
nRBC: 0 % (ref 0.0–0.2)

## 2021-10-15 LAB — URINALYSIS, COMPLETE (UACMP) WITH MICROSCOPIC
Bacteria, UA: NONE SEEN
Bilirubin Urine: NEGATIVE
Glucose, UA: NEGATIVE mg/dL
Ketones, ur: NEGATIVE mg/dL
Leukocytes,Ua: NEGATIVE
Nitrite: NEGATIVE
Protein, ur: NEGATIVE mg/dL
Specific Gravity, Urine: >1.046 — ABNORMAL HIGH (ref 1.005–1.030)
pH: 6 (ref 5.0–8.0)

## 2021-10-15 MED ORDER — IOHEXOL 350 MG/ML SOLN
75.0000 mL | Freq: Once | INTRAVENOUS | Status: AC | PRN
  Administered 2021-10-15: 75 mL via INTRAVENOUS

## 2021-10-15 MED ORDER — IOHEXOL 9 MG/ML PO SOLN
500.0000 mL | ORAL | Status: AC
Start: 2021-10-15 — End: 2021-10-15
  Administered 2021-10-15: 500 mL via ORAL

## 2021-10-15 NOTE — Progress Notes (Signed)
11 Days Post-Op   Subjective/Chief Complaint: More ab pain but having flatus and bms   Objective: Vital signs in last 24 hours: Temp:  [98 F (36.7 C)-98.5 F (36.9 C)] 98.1 F (36.7 C) (10/17 0647) Pulse Rate:  [75-89] 89 (10/17 0647) Resp:  [15-18] 18 (10/17 0647) BP: (130-135)/(94-106) 135/106 (10/17 0647) SpO2:  [96 %-98 %] 96 % (10/17 0647) Last BM Date: 10/14/21  Intake/Output from previous day: 10/16 0701 - 10/17 0700 In: 2730.1 [P.O.:2530; IV Piggyback:200.1] Out: 15 [Drains:15] Intake/Output this shift: Total I/O In: 0  Out: 10 [Drains:10]  Awake, alert Soft mild distended today moderately tender drain serosang  Lab Results:  Recent Labs    10/14/21 0346 10/15/21 0437  WBC 14.7* 20.0*  HGB 10.0* 9.8*  HCT 29.8* 29.0*  PLT 576* 639*   BMET Recent Labs    10/12/21 1113 10/13/21 0439  NA 133* 134*  K 4.0 3.8  CL 99 100  CO2 26 25  GLUCOSE 92 98  BUN <5* 7  CREATININE 0.62 0.54*  CALCIUM 8.4* 8.4*   PT/INR No results for input(s): LABPROT, INR in the last 72 hours. ABG No results for input(s): PHART, HCO3 in the last 72 hours.  Invalid input(s): PCO2, PO2  Studies/Results: [place] EKG SITE RITE  Result Date: 10/14/2021 If Site Rite image not attached, placement could not be confirmed due to current cardiac rhythm.   Anti-infectives: Anti-infectives (From admission, onward)    Start     Dose/Rate Route Frequency Ordered Stop   10/05/21 1000  piperacillin-tazobactam (ZOSYN) IVPB 3.375 g        3.375 g 12.5 mL/hr over 240 Minutes Intravenous Every 8 hours 10/05/21 0848     10/04/21 1500  cefTRIAXone (ROCEPHIN) 2 g in sodium chloride 0.9 % 100 mL IVPB  Status:  Discontinued       See Hyperspace for full Linked Orders Report.   2 g 200 mL/hr over 30 Minutes Intravenous Daily 10/04/21 1022 10/05/21 0848   10/04/21 1500  metroNIDAZOLE (FLAGYL) IVPB 500 mg  Status:  Discontinued       See Hyperspace for full Linked Orders Report.   500 mg 100  mL/hr over 60 Minutes Intravenous 2 times daily 10/04/21 1022 10/05/21 0848   10/04/21 0900  piperacillin-tazobactam (ZOSYN) IVPB 3.375 g        3.375 g 100 mL/hr over 30 Minutes Intravenous  Once 10/04/21 0850 10/04/21 0934       Assessment/Plan: POD#11 S/p laparoscopic appendectomy for Perforated gangrenous appendicitis 10/6 Dr. Marcello Moores  - CT 10/11 reports SBO, developing abscess in the central upper pelvis >> reviewed with IR, unable to drain - continue IV zosyn  - having some bowel function but still getting bloated when he eats and cannot tolerate much. Ileus slowly improving. Soft diet as tolerated. colace and miralax - mobilize - continue JP drain - serous. Likely can d/c at discharge - ct scan today due to pain and wbc going up   ID - zosyn 10/6>> FEN - IVF, soft diet VTE - SCDs, lovenox  Rolm Bookbinder 10/15/2021

## 2021-10-16 LAB — CBC
HCT: 30.2 % — ABNORMAL LOW (ref 39.0–52.0)
Hemoglobin: 10 g/dL — ABNORMAL LOW (ref 13.0–17.0)
MCH: 30 pg (ref 26.0–34.0)
MCHC: 33.1 g/dL (ref 30.0–36.0)
MCV: 90.7 fL (ref 80.0–100.0)
Platelets: 777 10*3/uL — ABNORMAL HIGH (ref 150–400)
RBC: 3.33 MIL/uL — ABNORMAL LOW (ref 4.22–5.81)
RDW: 14.6 % (ref 11.5–15.5)
WBC: 12.4 10*3/uL — ABNORMAL HIGH (ref 4.0–10.5)
nRBC: 0 % (ref 0.0–0.2)

## 2021-10-16 MED ORDER — OXYCODONE HCL 5 MG PO TABS
5.0000 mg | ORAL_TABLET | ORAL | Status: DC | PRN
Start: 2021-10-16 — End: 2021-10-18
  Administered 2021-10-16 – 2021-10-17 (×3): 10 mg via ORAL
  Administered 2021-10-17: 5 mg via ORAL
  Administered 2021-10-17 – 2021-10-18 (×6): 10 mg via ORAL
  Filled 2021-10-16 (×10): qty 2

## 2021-10-16 MED ORDER — POLYETHYLENE GLYCOL 3350 17 G PO PACK
17.0000 g | PACK | Freq: Two times a day (BID) | ORAL | Status: DC
  Administered 2021-10-16 (×2): 17 g via ORAL
  Filled 2021-10-16 (×5): qty 1

## 2021-10-16 MED ORDER — ACETAMINOPHEN 500 MG PO TABS
1000.0000 mg | ORAL_TABLET | Freq: Four times a day (QID) | ORAL | Status: DC
  Administered 2021-10-16 – 2021-10-18 (×7): 1000 mg via ORAL
  Filled 2021-10-16 (×8): qty 2

## 2021-10-16 NOTE — Progress Notes (Signed)
MD contacted for off unit privileges.

## 2021-10-16 NOTE — Progress Notes (Signed)
12 Days Post-Op  Subjective: CC: Patient describes constant pain in his left central abdomen along with bloating/distension that is moderate in severity. He has waves of sharp pain especially after coughing or walking that resolves with rest or pain meds. He tolerated all of dinner yesterday without worsening distension, pain, n/v. He is passing flatus and had a loose BM yesterday. Mobilizing in the halls. Voiding without issues. No CP or SOB. Notes baseline cough that he has had for several years (describes it as his smokers cough) and is non-productive.   Objective: Vital signs in last 24 hours: Temp:  [97.9 F (36.6 C)-98.5 F (36.9 C)] 97.9 F (36.6 C) (10/18 0627) Pulse Rate:  [80-97] 97 (10/18 0627) Resp:  [16-18] 18 (10/18 0627) BP: (121-148)/(88-104) 140/96 (10/18 0627) SpO2:  [92 %-96 %] 96 % (10/18 0627) Last BM Date: 10/15/21  Intake/Output from previous day: 10/17 0701 - 10/18 0700 In: 1010 [P.O.:960; IV Piggyback:50] Out: 10 [Drains:10] Intake/Output this shift: No intake/output data recorded.  PE: Gen:  Alert, NAD, pleasant HEENT: EOM's intact, pupils equal and round Card:  RRR Pulm:  CTAB, no W/R/R, effort normal. On RA.  Abd: Soft, moderate distension, tenderness of the central abdomen and right lower abdomen. +BS. Laparoscopic sites with some bruising but c/d/I and without signs of infection/cellulitis. Drain SS.  Ext:  No LE edema or calf tenderness Psych: A&Ox3  Skin: no rashes noted, warm and dry  Lab Results:  Recent Labs    10/15/21 0437 10/16/21 0802  WBC 20.0* 12.4*  HGB 9.8* 10.0*  HCT 29.0* 30.2*  PLT 639* 777*   BMET No results for input(s): NA, K, CL, CO2, GLUCOSE, BUN, CREATININE, CALCIUM in the last 72 hours. PT/INR No results for input(s): LABPROT, INR in the last 72 hours. CMP     Component Value Date/Time   NA 134 (L) 10/13/2021 0439   K 3.8 10/13/2021 0439   CL 100 10/13/2021 0439   CO2 25 10/13/2021 0439   GLUCOSE 98  10/13/2021 0439   BUN 7 10/13/2021 0439   CREATININE 0.54 (L) 10/13/2021 0439   CALCIUM 8.4 (L) 10/13/2021 0439   PROT 6.7 10/04/2021 0621   ALBUMIN 3.5 10/04/2021 0621   AST 22 10/04/2021 0621   ALT 14 10/04/2021 0621   ALKPHOS 64 10/04/2021 0621   BILITOT 1.7 (H) 10/04/2021 0621   GFRNONAA >60 10/13/2021 0439   Lipase     Component Value Date/Time   LIPASE 24 10/04/2021 0621    Studies/Results: CT ABDOMEN PELVIS W CONTRAST  Result Date: 10/15/2021 CLINICAL DATA:  Abdominal infection suspected. Postop day 11 status post laparoscopic appendectomy. EXAM: CT ABDOMEN AND PELVIS WITH CONTRAST TECHNIQUE: Multidetector CT imaging of the abdomen and pelvis was performed using the standard protocol following bolus administration of intravenous contrast. CONTRAST:  55mL OMNIPAQUE IOHEXOL 350 MG/ML SOLN COMPARISON:  CT abdomen and pelvis 10/09/2021. FINDINGS: Lower chest: There are atelectatic changes in the lung bases. Hepatobiliary: No focal liver abnormality is seen. No gallstones, gallbladder wall thickening, or biliary dilatation. Pancreas: Unremarkable. No pancreatic ductal dilatation or surrounding inflammatory changes. Spleen: Normal in size without focal abnormality. Adrenals/Urinary Tract: Indeterminate subcentimeter hypodense lesion in the superior pole the left kidney is unchanged. Small right renal cyst is unchanged. The kidneys, adrenal glands and bladder are otherwise within normal limits. Stomach/Bowel: There are dilated jejunal loops with air-fluid levels measuring up to 5.5 cm. Degree of distention is mildly increased. The stomach is nondilated. No definitive transition point  is seen, but oral contrast is seen beyond the transition within nondilated central small bowel. Distal small bowel and colon are nondilated. Anastomotic site is seen in the right lower quadrant. Colon is nondilated. Appendix is not seen. Vascular/Lymphatic: No significant vascular findings are present. No enlarged  abdominal or pelvic lymph nodes. Reproductive: Prostate is unremarkable. Other: Percutaneous drainage catheter is again seen entering the left lower quadrant anterior abdominal wall. The distal catheter tip is in the lateral right abdomen, unchanged from the prior examination. There is a wall enhancing fluid collection in the lower central pelvis approximating small bowel loops. This collection is similar in size when compared to the prior study measuring 5.9 x 2.4 x 2.8 cm images 2/64 and 4/46. No new separate enhancing fluid collections are identified. Catheter does not approximate this collection. There are few tiny bubbles of free air in the right lower quadrant adjacent to the ileocolic region with mild inflammatory stranding. There is no focal abdominal wall hernia. There is mild body wall edema. Musculoskeletal: No acute or significant osseous findings. IMPRESSION: 1. Enhancing pelvic fluid collection is unchanged in size worrisome for pelvic abscess. Note is made that the drainage catheter does not approximate this fluid collection. 2. Tiny foci of free air in the right lower quadrant with surrounding stranding in the ileocolic region which may be related to recent surgery. Micro perforation is not excluded. 3. Dilated jejunal loops. Contrast is seen within nondilated mid small bowel. Findings may represent partial small bowel obstruction versus ileus. Continued follow-up recommended. 4. Unchanged indeterminate small left renal lesion. Recommend follow-up MRI. Electronically Signed   By: Ronney Asters M.D.   On: 10/15/2021 16:07   DG CHEST PORT 1 VIEW  Result Date: 10/15/2021 CLINICAL DATA:  Leukocytosis. EXAM: PORTABLE CHEST 1 VIEW COMPARISON:  Chest x-ray 09/16/2021. FINDINGS: There is linear atelectasis or scarring in the left mid and lower lung. There is some faint patchy opacities in the right lower lung. Costophrenic angles are clear. There is no pneumothorax. The cardiomediastinal silhouette is  within normal limits. No acute fractures are seen. Cervical spinal fusion hardware is partially visualized. IMPRESSION: 1. Faint hazy opacities in the right lower lung may be related to infection. Electronically Signed   By: Ronney Asters M.D.   On: 10/15/2021 17:46   [place] EKG SITE RITE  Result Date: 10/14/2021 If Site Rite image not attached, placement could not be confirmed due to current cardiac rhythm.   Anti-infectives: Anti-infectives (From admission, onward)    Start     Dose/Rate Route Frequency Ordered Stop   10/05/21 1000  piperacillin-tazobactam (ZOSYN) IVPB 3.375 g        3.375 g 12.5 mL/hr over 240 Minutes Intravenous Every 8 hours 10/05/21 0848     10/04/21 1500  cefTRIAXone (ROCEPHIN) 2 g in sodium chloride 0.9 % 100 mL IVPB  Status:  Discontinued       See Hyperspace for full Linked Orders Report.   2 g 200 mL/hr over 30 Minutes Intravenous Daily 10/04/21 1022 10/05/21 0848   10/04/21 1500  metroNIDAZOLE (FLAGYL) IVPB 500 mg  Status:  Discontinued       See Hyperspace for full Linked Orders Report.   500 mg 100 mL/hr over 60 Minutes Intravenous 2 times daily 10/04/21 1022 10/05/21 0848   10/04/21 0900  piperacillin-tazobactam (ZOSYN) IVPB 3.375 g        3.375 g 100 mL/hr over 30 Minutes Intravenous  Once 10/04/21 0850 10/04/21 0934  Assessment/Plan POD#12 S/p laparoscopic appendectomy for Perforated gangrenous appendicitis 10/6 Dr. Marcello Moores  - CT 10/11 reports SBO, developing abscess in the central upper pelvis >> reviewed with IR, unable to drain - CT 10/17 w/ unchanged pelvic fluid collection and dilated small bowel - WBC better today. Cont IV abx. If not improving in the AM, consider laparoscopic drainage.  - mobilize, pulm toilet - continue JP drain    ID - zosyn 10/6>> FEN - IVF, soft diet. NPO at midnight.  VTE - SCDs, lovenox   LOS: 11 days    Jillyn Ledger , [facility] 10/16/2021, 8:32 AM Please see Amion for pager number  during day hours 7:00am-4:30pm

## 2021-10-17 LAB — CBC
HCT: 29.9 % — ABNORMAL LOW (ref 39.0–52.0)
Hemoglobin: 9.7 g/dL — ABNORMAL LOW (ref 13.0–17.0)
MCH: 29.8 pg (ref 26.0–34.0)
MCHC: 32.4 g/dL (ref 30.0–36.0)
MCV: 92 fL (ref 80.0–100.0)
Platelets: 808 10*3/uL — ABNORMAL HIGH (ref 150–400)
RBC: 3.25 MIL/uL — ABNORMAL LOW (ref 4.22–5.81)
RDW: 14.6 % (ref 11.5–15.5)
WBC: 9.8 10*3/uL (ref 4.0–10.5)
nRBC: 0 % (ref 0.0–0.2)

## 2021-10-17 MED ORDER — AMOXICILLIN-POT CLAVULANATE 875-125 MG PO TABS
1.0000 | ORAL_TABLET | Freq: Two times a day (BID) | ORAL | Status: DC
  Administered 2021-10-17 – 2021-10-18 (×3): 1 via ORAL
  Filled 2021-10-17 (×3): qty 1

## 2021-10-17 MED ORDER — LIP MEDEX EX OINT
TOPICAL_OINTMENT | CUTANEOUS | Status: AC
  Filled 2021-10-17: qty 7

## 2021-10-17 MED ORDER — PANTOPRAZOLE SODIUM 40 MG PO TBEC
40.0000 mg | DELAYED_RELEASE_TABLET | Freq: Every day | ORAL | Status: DC
  Administered 2021-10-18: 40 mg via ORAL
  Filled 2021-10-17: qty 1

## 2021-10-17 MED ORDER — METHOCARBAMOL 500 MG PO TABS
1000.0000 mg | ORAL_TABLET | Freq: Four times a day (QID) | ORAL | Status: DC
  Administered 2021-10-17 – 2021-10-18 (×5): 1000 mg via ORAL
  Filled 2021-10-17 (×6): qty 2

## 2021-10-17 NOTE — Progress Notes (Signed)
PHARMACIST - PHYSICIAN COMMUNICATION  DR:   Donne Hazel  CONCERNING: IV to Oral Route Change Policy  RECOMMENDATION: This patient is receiving protonix by the intravenous route.  Based on criteria approved by the Pharmacy and Therapeutics Committee, the intravenous medication(s) is/are being converted to the equivalent oral dose form(s).   DESCRIPTION: These criteria include: The patient is eating (either orally or via tube) and/or has been taking other orally administered medications for a least 24 hours The patient has no evidence of active gastrointestinal bleeding or impaired GI absorption (gastrectomy, short bowel, patient on TNA or NPO).  If you have questions about this conversion, please contact the Pharmacy Department  []   [phone] )  [facility] []   [phone] )  [facility] []   [phone] )  [facility] []   [phone] )  [facility] [x]   [phone] )  [place], PharmD, [place]: [phone] 10/17/2021 11:54 AM

## 2021-10-17 NOTE — Progress Notes (Signed)
13 Days Post-Op  Subjective: CC: Patient reports he is still bloated but tolerating all of his trays without increased bloating/pain, n/v. Continues to pass flatus and had a formed bm yesterday and loose bm this am. He continues to have some pain in his central/left abdomen that is improved to a 4/10 this am. He is mobilizing and voiding without difficulty or issue. No other complaints. He feels safe going home with his wife at d/c.   Objective: Vital signs in last 24 hours: Temp:  [97.7 F (36.5 C)-98.7 F (37.1 C)] 98 F (36.7 C) (10/19 0626) Pulse Rate:  [65-89] 68 (10/19 0626) Resp:  [18] 18 (10/19 0626) BP: (128-152)/(85-100) 128/96 (10/19 0626) SpO2:  [94 %-95 %] 95 % (10/19 0626) Last BM Date: 10/16/21  Intake/Output from previous day: 10/18 0701 - 10/19 0700 In: 1623.7 [P.O.:1440; IV Piggyback:183.7] Out: 20 [Drains:20] Intake/Output this shift: No intake/output data recorded.  PE: Gen:  Alert, NAD, pleasant HEENT: EOM's intact, pupils equal and round Card:  RRR Pulm:  CTAB, no W/R/R, effort normal. On RA.  Abd: Soft, mild to moderate distension, tenderness of the central abdomen and left mid abdomen. +BS. Laparoscopic sites with some bruising but c/d/I and without signs of infection/cellulitis. Drain SS.  Ext:  No LE edema or calf tenderness Psych: A&Ox3  Skin: no rashes noted, warm and dry  Lab Results:  Recent Labs    10/16/21 0802 10/17/21 0418  WBC 12.4* 9.8  HGB 10.0* 9.7*  HCT 30.2* 29.9*  PLT 777* 808*   BMET No results for input(s): NA, K, CL, CO2, GLUCOSE, BUN, CREATININE, CALCIUM in the last 72 hours. PT/INR No results for input(s): LABPROT, INR in the last 72 hours. CMP     Component Value Date/Time   NA 134 (L) 10/13/2021 0439   K 3.8 10/13/2021 0439   CL 100 10/13/2021 0439   CO2 25 10/13/2021 0439   GLUCOSE 98 10/13/2021 0439   BUN 7 10/13/2021 0439   CREATININE 0.54 (L) 10/13/2021 0439   CALCIUM 8.4 (L) 10/13/2021 0439   PROT  6.7 10/04/2021 0621   ALBUMIN 3.5 10/04/2021 0621   AST 22 10/04/2021 0621   ALT 14 10/04/2021 0621   ALKPHOS 64 10/04/2021 0621   BILITOT 1.7 (H) 10/04/2021 0621   GFRNONAA >60 10/13/2021 0439   Lipase     Component Value Date/Time   LIPASE 24 10/04/2021 0621    Studies/Results: CT ABDOMEN PELVIS W CONTRAST  Result Date: 10/15/2021 CLINICAL DATA:  Abdominal infection suspected. Postop day 11 status post laparoscopic appendectomy. EXAM: CT ABDOMEN AND PELVIS WITH CONTRAST TECHNIQUE: Multidetector CT imaging of the abdomen and pelvis was performed using the standard protocol following bolus administration of intravenous contrast. CONTRAST:  56mL OMNIPAQUE IOHEXOL 350 MG/ML SOLN COMPARISON:  CT abdomen and pelvis 10/09/2021. FINDINGS: Lower chest: There are atelectatic changes in the lung bases. Hepatobiliary: No focal liver abnormality is seen. No gallstones, gallbladder wall thickening, or biliary dilatation. Pancreas: Unremarkable. No pancreatic ductal dilatation or surrounding inflammatory changes. Spleen: Normal in size without focal abnormality. Adrenals/Urinary Tract: Indeterminate subcentimeter hypodense lesion in the superior pole the left kidney is unchanged. Small right renal cyst is unchanged. The kidneys, adrenal glands and bladder are otherwise within normal limits. Stomach/Bowel: There are dilated jejunal loops with air-fluid levels measuring up to 5.5 cm. Degree of distention is mildly increased. The stomach is nondilated. No definitive transition point is seen, but oral contrast is seen beyond the transition within nondilated central  small bowel. Distal small bowel and colon are nondilated. Anastomotic site is seen in the right lower quadrant. Colon is nondilated. Appendix is not seen. Vascular/Lymphatic: No significant vascular findings are present. No enlarged abdominal or pelvic lymph nodes. Reproductive: Prostate is unremarkable. Other: Percutaneous drainage catheter is again  seen entering the left lower quadrant anterior abdominal wall. The distal catheter tip is in the lateral right abdomen, unchanged from the prior examination. There is a wall enhancing fluid collection in the lower central pelvis approximating small bowel loops. This collection is similar in size when compared to the prior study measuring 5.9 x 2.4 x 2.8 cm images 2/64 and 4/46. No new separate enhancing fluid collections are identified. Catheter does not approximate this collection. There are few tiny bubbles of free air in the right lower quadrant adjacent to the ileocolic region with mild inflammatory stranding. There is no focal abdominal wall hernia. There is mild body wall edema. Musculoskeletal: No acute or significant osseous findings. IMPRESSION: 1. Enhancing pelvic fluid collection is unchanged in size worrisome for pelvic abscess. Note is made that the drainage catheter does not approximate this fluid collection. 2. Tiny foci of free air in the right lower quadrant with surrounding stranding in the ileocolic region which may be related to recent surgery. Micro perforation is not excluded. 3. Dilated jejunal loops. Contrast is seen within nondilated mid small bowel. Findings may represent partial small bowel obstruction versus ileus. Continued follow-up recommended. 4. Unchanged indeterminate small left renal lesion. Recommend follow-up MRI. Electronically Signed   By: Ronney Asters M.D.   On: 10/15/2021 16:07   DG CHEST PORT 1 VIEW  Result Date: 10/15/2021 CLINICAL DATA:  Leukocytosis. EXAM: PORTABLE CHEST 1 VIEW COMPARISON:  Chest x-ray 09/16/2021. FINDINGS: There is linear atelectasis or scarring in the left mid and lower lung. There is some faint patchy opacities in the right lower lung. Costophrenic angles are clear. There is no pneumothorax. The cardiomediastinal silhouette is within normal limits. No acute fractures are seen. Cervical spinal fusion hardware is partially visualized. IMPRESSION:  1. Faint hazy opacities in the right lower lung may be related to infection. Electronically Signed   By: Ronney Asters M.D.   On: 10/15/2021 17:46    Anti-infectives: Anti-infectives (From admission, onward)    Start     Dose/Rate Route Frequency Ordered Stop   10/05/21 1000  piperacillin-tazobactam (ZOSYN) IVPB 3.375 g        3.375 g 12.5 mL/hr over 240 Minutes Intravenous Every 8 hours 10/05/21 0848     10/04/21 1500  cefTRIAXone (ROCEPHIN) 2 g in sodium chloride 0.9 % 100 mL IVPB  Status:  Discontinued       See Hyperspace for full Linked Orders Report.   2 g 200 mL/hr over 30 Minutes Intravenous Daily 10/04/21 1022 10/05/21 0848   10/04/21 1500  metroNIDAZOLE (FLAGYL) IVPB 500 mg  Status:  Discontinued       See Hyperspace for full Linked Orders Report.   500 mg 100 mL/hr over 60 Minutes Intravenous 2 times daily 10/04/21 1022 10/05/21 0848   10/04/21 0900  piperacillin-tazobactam (ZOSYN) IVPB 3.375 g        3.375 g 100 mL/hr over 30 Minutes Intravenous  Once 10/04/21 0850 10/04/21 0934        Assessment/Plan POD#13 S/p laparoscopic appendectomy for Perforated gangrenous appendicitis 10/6 Dr. Marcello Moores  - CT 10/11 reports SBO, developing abscess in the central upper pelvis >> reviewed with IR, unable to drain - CT 10/17  w/ unchanged pelvic fluid collection and dilated small bowel - WBC normalized. Will hold off on surgery given laboratory and clinical improvement. Transition to oral abx today. If continues to improve, possible d/c in am.  - mobilize, pulm toilet - d/c JP drain    ID - zosyn 10/6 - 10/19. Augmentin 10/19 >>  FEN - IVF, soft diet VTE - SCDs, lovenox   LOS: 12 days    Jillyn Ledger , [facility] 10/17/2021, 9:16 AM Please see Amion for pager number during day hours 7:00am-4:30pm

## 2021-10-18 MED ORDER — ACETAMINOPHEN 500 MG PO TABS: 1000.0000 mg | ORAL_TABLET | Freq: Three times a day (TID) | ORAL | 0 refills | Status: DC | PRN

## 2021-10-18 MED ORDER — AMOXICILLIN-POT CLAVULANATE 875-125 MG PO TABS: 1.0000 | ORAL_TABLET | Freq: Two times a day (BID) | ORAL | 0 refills | Status: DC

## 2021-10-18 MED ORDER — POLYETHYLENE GLYCOL 3350 17 G PO PACK: 17.0000 g | PACK | Freq: Two times a day (BID) | ORAL | 0 refills | Status: DC | PRN

## 2021-10-18 MED ORDER — METHOCARBAMOL 500 MG PO TABS: 1000.0000 mg | ORAL_TABLET | Freq: Four times a day (QID) | ORAL | 0 refills | Status: DC | PRN

## 2021-10-18 MED ORDER — OXYCODONE HCL 5 MG PO TABS: 10.0000 mg | ORAL_TABLET | Freq: Four times a day (QID) | ORAL | 0 refills | Status: DC | PRN

## 2021-10-18 MED ORDER — ONDANSETRON 4 MG PO TBDP: 4.0000 mg | ORAL_TABLET | Freq: Four times a day (QID) | ORAL | 0 refills | Status: DC | PRN

## 2021-10-18 MED ORDER — LIP MEDEX EX OINT
TOPICAL_OINTMENT | CUTANEOUS | Status: AC
  Filled 2021-10-18: qty 7
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
## 2021-10-18 NOTE — Progress Notes (Signed)
Discharge instructions discussed with patient, verbalized agreement and understanding 

## 2021-10-19 ENCOUNTER — Other Ambulatory Visit: Payer: Self-pay | Admitting: Physician Assistant

## 2021-10-19 DIAGNOSIS — R1031 Right lower quadrant pain: Secondary | ICD-10-CM

## 2021-10-23 ENCOUNTER — Other Ambulatory Visit: Payer: Self-pay

## 2021-10-23 ENCOUNTER — Encounter (HOSPITAL_COMMUNITY): Payer: Self-pay

## 2021-10-23 ENCOUNTER — Inpatient Hospital Stay (HOSPITAL_COMMUNITY)
Admission: EM | Admit: 2021-10-23 | Discharge: 2021-10-27 | DRG: 388 | Disposition: A | Discharge status: Home / Self Care | Payer: Self-pay | Attending: Surgery | Admitting: Surgery

## 2021-10-23 ENCOUNTER — Emergency Department (HOSPITAL_COMMUNITY): Payer: Self-pay

## 2021-10-23 DIAGNOSIS — K56609 Unspecified intestinal obstruction, unspecified as to partial versus complete obstruction: Secondary | ICD-10-CM

## 2021-10-23 DIAGNOSIS — F32A Depression, unspecified: Secondary | ICD-10-CM | POA: Diagnosis present

## 2021-10-23 DIAGNOSIS — Z79899 Other long term (current) drug therapy: Secondary | ICD-10-CM

## 2021-10-23 DIAGNOSIS — K529 Noninfective gastroenteritis and colitis, unspecified: Secondary | ICD-10-CM | POA: Diagnosis present

## 2021-10-23 DIAGNOSIS — U071 COVID-19: Secondary | ICD-10-CM | POA: Diagnosis present

## 2021-10-23 DIAGNOSIS — K567 Ileus, unspecified: Secondary | ICD-10-CM | POA: Diagnosis present

## 2021-10-23 DIAGNOSIS — W19XXXA Unspecified fall, initial encounter: Secondary | ICD-10-CM | POA: Diagnosis present

## 2021-10-23 DIAGNOSIS — Z9049 Acquired absence of other specified parts of digestive tract: Secondary | ICD-10-CM

## 2021-10-23 DIAGNOSIS — Z4659 Encounter for fitting and adjustment of other gastrointestinal appliance and device: Secondary | ICD-10-CM

## 2021-10-23 DIAGNOSIS — Z981 Arthrodesis status: Secondary | ICD-10-CM

## 2021-10-23 DIAGNOSIS — F17210 Nicotine dependence, cigarettes, uncomplicated: Secondary | ICD-10-CM | POA: Diagnosis present

## 2021-10-23 DIAGNOSIS — X501XXA Overexertion from prolonged static or awkward postures, initial encounter: Secondary | ICD-10-CM

## 2021-10-23 DIAGNOSIS — Z885 Allergy status to narcotic agent status: Secondary | ICD-10-CM

## 2021-10-23 DIAGNOSIS — Z8547 Personal history of malignant neoplasm of testis: Secondary | ICD-10-CM

## 2021-10-23 DIAGNOSIS — K9189 Other postprocedural complications and disorders of digestive system: Secondary | ICD-10-CM | POA: Diagnosis present

## 2021-10-23 HISTORY — DX: Unspecified intestinal obstruction, unspecified as to partial versus complete obstruction: K56.609

## 2021-10-23 LAB — CBC WITH DIFFERENTIAL/PLATELET
Abs Immature Granulocytes: 0.03 10*3/uL (ref 0.00–0.07)
Basophils Absolute: 0.1 10*3/uL (ref 0.0–0.1)
Basophils Relative: 1 %
Eosinophils Absolute: 0.6 10*3/uL — ABNORMAL HIGH (ref 0.0–0.5)
Eosinophils Relative: 5 %
HCT: 31.2 % — ABNORMAL LOW (ref 39.0–52.0)
Hemoglobin: 10.4 g/dL — ABNORMAL LOW (ref 13.0–17.0)
Immature Granulocytes: 0 %
Lymphocytes Relative: 27 %
Lymphs Abs: 3 10*3/uL (ref 0.7–4.0)
MCH: 29.5 pg (ref 26.0–34.0)
MCHC: 33.3 g/dL (ref 30.0–36.0)
MCV: 88.6 fL (ref 80.0–100.0)
Monocytes Absolute: 1 10*3/uL (ref 0.1–1.0)
Monocytes Relative: 9 %
Neutro Abs: 6.3 10*3/uL (ref 1.7–7.7)
Neutrophils Relative %: 58 %
Platelets: 651 10*3/uL — ABNORMAL HIGH (ref 150–400)
RBC: 3.52 MIL/uL — ABNORMAL LOW (ref 4.22–5.81)
RDW: 13.9 % (ref 11.5–15.5)
WBC: 11.1 10*3/uL — ABNORMAL HIGH (ref 4.0–10.5)
nRBC: 0 % (ref 0.0–0.2)

## 2021-10-23 LAB — I-STAT CHEM 8, ED
BUN: <3 mg/dL — ABNORMAL LOW (ref 6–20)
Calcium, Ion: 1.17 mmol/L (ref 1.15–1.40)
Chloride: 100 mmol/L (ref 98–111)
Creatinine, Ser: 0.5 mg/dL — ABNORMAL LOW (ref 0.61–1.24)
Glucose, Bld: 96 mg/dL (ref 70–99)
HCT: 32 % — ABNORMAL LOW (ref 39.0–52.0)
Hemoglobin: 10.9 g/dL — ABNORMAL LOW (ref 13.0–17.0)
Potassium: 3.2 mmol/L — ABNORMAL LOW (ref 3.5–5.1)
Sodium: 139 mmol/L (ref 135–145)
TCO2: 27 mmol/L (ref 22–32)

## 2021-10-23 LAB — COMPREHENSIVE METABOLIC PANEL
ALT: 19 U/L (ref 0–44)
AST: 29 U/L (ref 15–41)
Albumin: 3.6 g/dL (ref 3.5–5.0)
Alkaline Phosphatase: 76 U/L (ref 38–126)
Anion gap: 9 (ref 5–15)
BUN: 5 mg/dL — ABNORMAL LOW (ref 6–20)
CO2: 26 mmol/L (ref 22–32)
Calcium: 9 mg/dL (ref 8.9–10.3)
Chloride: 102 mmol/L (ref 98–111)
Creatinine, Ser: 0.6 mg/dL — ABNORMAL LOW (ref 0.61–1.24)
GFR, Estimated: >60 mL/min (ref >60)
Glucose, Bld: 96 mg/dL (ref 70–99)
Potassium: 3.1 mmol/L — ABNORMAL LOW (ref 3.5–5.1)
Sodium: 137 mmol/L (ref 135–145)
Total Bilirubin: 0.5 mg/dL (ref 0.3–1.2)
Total Protein: 7.8 g/dL (ref 6.5–8.1)

## 2021-10-23 LAB — LIPASE, BLOOD: Lipase: 51 U/L (ref 11–51)

## 2021-10-23 MED ORDER — IOHEXOL 350 MG/ML SOLN
80.0000 mL | Freq: Once | INTRAVENOUS | Status: AC | PRN
  Administered 2021-10-23: 80 mL via INTRAVENOUS

## 2021-10-23 MED ORDER — MORPHINE SULFATE (PF) 4 MG/ML IV SOLN
4.0000 mg | Freq: Once | INTRAVENOUS | Status: AC
  Administered 2021-10-23: 4 mg via INTRAVENOUS
  Filled 2021-10-23: qty 1

## 2021-10-23 MED ORDER — HYDROMORPHONE HCL 1 MG/ML IJ SOLN
1.0000 mg | Freq: Once | INTRAMUSCULAR | Status: AC
Start: 2021-10-23 — End: 2021-10-23
  Administered 2021-10-23: 1 mg via INTRAVENOUS
  Filled 2021-10-23: qty 1

## 2021-10-23 MED ORDER — LIDOCAINE HCL URETHRAL/MUCOSAL 2 % EX GEL
1.0000 "application " | Freq: Once | CUTANEOUS | Status: AC
  Administered 2021-10-23: 1
  Filled 2021-10-23: qty 11

## 2021-10-23 MED ORDER — PIPERACILLIN-TAZOBACTAM 3.375 G IVPB 30 MIN
3.3750 g | Freq: Once | INTRAVENOUS | Status: AC
  Administered 2021-10-23: 3.375 g via INTRAVENOUS
  Filled 2021-10-23: qty 50

## 2021-10-23 MED ORDER — DIPHENHYDRAMINE HCL 50 MG/ML IJ SOLN
25.0000 mg | Freq: Once | INTRAMUSCULAR | Status: AC
  Administered 2021-10-23: 25 mg via INTRAVENOUS
  Filled 2021-10-23: qty 1

## 2021-10-23 NOTE — Progress Notes (Signed)
Patient ID: Joshua Cooper, male   DOB: September 10, 1976, 45 y.o.   MRN: [id number]  Patient to be readmitted for recurrent SBO, enteritis likely secondary to recent perforated appendicitis  NPO NG tube Zosyn IV fluids  Imogene Burn. Georgette Dover, MD, [facility]  General Surgery   10/23/2021 11:31 PM

## 2021-10-23 NOTE — ED Provider Notes (Signed)
Emergency Medicine Provider Triage Evaluation Note  Joshua Cooper , a 45 y.o. male  was evaluated in triage.  Pt complains of abdominal pain. Pain started today suddenly when he bent over. Worse in RUQ and RLQ. Rates it 10/10. He had a recent appendectomy and he felt something pop in his abdomen. He has been having severe pain since then that has been worsening. He has had associated diaphoresis and increased work of breathing. Feels like his abdomen is distended. No n/v/d. He also endorses having an abdominal abscess that he recently found out about. Unsure if this has ruptured and causing symptoms   Review of Systems  Positive: Abdominal pain, diaphoresis, abd distension, increased work of breathing  Negative: Cp, n/v/d, constipation  Physical Exam  BP (!) 145/102 (BP Location: Left Arm)   Pulse (!) 112   Temp 98 F (36.7 C)   Resp (!) 26   Ht 5\' 11"  (1.803 m)   Wt 74.8 kg   SpO2 99%   BMI 23.01 kg/m  Gen:   Awake, in acute distress Resp:  Labored breathing MSK:   Moves extremities without difficulty  Other:  Abdomen with guarding and rigidity in all quadrants  Medical Decision Making  Medically screening exam initiated at 9:47 PM.  Appropriate orders placed.  Rahul Medina was informed that the remainder of the evaluation will be completed by another provider, this initial triage assessment does not replace that evaluation, and the importance of remaining in the ED until their evaluation is complete.  Concerning abdominal exam. CT a/p ordered. Abdominal labs ordered. Notified RN to room patient when bed is available.    Sheila Oats 10/23/21 2153    Truddie Hidden, MD 10/23/21 2258
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
## 2021-10-23 NOTE — ED Provider Notes (Signed)
[facility] DEPT Provider Note   CSN: [id number] Arrival date & time: 10/23/21  2101     History Chief Complaint  Patient presents with   Abdominal Pain    Joshua Cooper is a 44 y.o. male.  The history is provided by the patient and medical records.  Abdominal Pain  45 y.o. M with hx of testicular cancer, presenting to the ED for abdominal pain.  Recent appendectomy 91/6/38 complicated by perforation, post-op ileus then SBO requiring NG tube with abdominal abscess requiring drain, here with abdominal pain after bending over today when he tripped.  He denies vomiting and has been having BM but states "they are not quite normal and not very big".  No fevers.  No changes in urination.  Past Medical History:  Diagnosis Date   Testicular cancer [facility])     Patient Active Problem List   Diagnosis Date Noted   Perforated appendicitis 10/05/2021   Acute appendicitis 10/04/2021    Past Surgical History:  Procedure Laterality Date   CERVICAL FUSION     LAPAROSCOPIC APPENDECTOMY N/A 10/04/2021   Procedure: APPENDECTOMY LAPAROSCOPIC;  Surgeon: Leighton Ruff, MD;  Location: [facility] ORS;  Service: General;  Laterality: N/A;   TONSILLECTOMY         History reviewed. No pertinent family history.  Social History   Tobacco Use   Smoking status: Some Days    Types: Cigarettes   Smokeless tobacco: Never  Substance Use Topics   Alcohol use: Yes    Alcohol/week: 2.0 standard drinks    Types: 2 Cans of beer per week   Drug use: Not Currently    Home Medications Prior to Admission medications   Medication Sig Start Date End Date Taking? Authorizing Provider  acetaminophen (TYLENOL) 500 MG tablet Take 2 tablets (1,000 mg total) by mouth every 8 (eight) hours as needed. 10/18/21   Maczis, Barth Kirks, PA-C  amoxicillin-clavulanate (AUGMENTIN) 875-125 MG tablet Take 1 tablet by mouth every 12 (twelve) hours. 10/18/21   Maczis, Barth Kirks, PA-C  ibuprofen  (ADVIL) 400 MG tablet Take 1 tablet (400 mg total) by mouth every 6 (six) hours as needed. Patient taking differently: Take 400 mg by mouth every 6 (six) hours as needed for fever, headache or mild pain. 09/17/21   Sponseller, Gypsy Balsam, PA-C  methocarbamol (ROBAXIN) 500 MG tablet Take 2 tablets (1,000 mg total) by mouth every 6 (six) hours as needed for muscle spasms. 10/18/21   Maczis, Barth Kirks, PA-C  ondansetron (ZOFRAN-ODT) 4 MG disintegrating tablet Take 1 tablet (4 mg total) by mouth every 6 (six) hours as needed for nausea. 10/18/21   Maczis, Barth Kirks, PA-C  oxyCODONE (OXY IR/ROXICODONE) 5 MG immediate release tablet Take 2 tablets (10 mg total) by mouth every 6 (six) hours as needed for breakthrough pain. 10/18/21   Maczis, Barth Kirks, PA-C  polyethylene glycol (MIRALAX / GLYCOLAX) 17 g packet Take 17 g by mouth 2 (two) times daily as needed. 10/18/21   Maczis, Barth Kirks, PA-C  venlafaxine XR (EFFEXOR-XR) 150 MG 24 hr capsule Take 150 mg by mouth every evening. 12/05/20 12/05/21  [provider]    Allergies    Morphine and related  Review of Systems   Review of Systems  Gastrointestinal:  Positive for abdominal pain.  All other systems reviewed and are negative.  Physical Exam Updated Vital Signs BP (!) 150/95 (BP Location: Left Arm)   Pulse 90   Temp 98.1 F (36.7 C) (Oral)  Resp 17   Ht 5\' 11"  (1.803 m)   Wt 74.8 kg   SpO2 99%   BMI 23.01 kg/m   Physical Exam Vitals and nursing note reviewed.  Constitutional:      Appearance: He is well-developed.  HENT:     Head: Normocephalic and atraumatic.  Eyes:     Conjunctiva/sclera: Conjunctivae normal.     Pupils: Pupils are equal, round, and reactive to light.  Cardiovascular:     Rate and Rhythm: Normal rate and regular rhythm.     Heart sounds: Normal heart sounds.  Pulmonary:     Effort: Pulmonary effort is normal.     Breath sounds: Normal breath sounds.  Abdominal:     General: Bowel sounds are normal.  There is distension.     Palpations: Abdomen is soft.     Tenderness: There is abdominal tenderness.     Comments: Tenderness mostly throughout left abdomen, distention noted  Musculoskeletal:        General: Normal range of motion.     Cervical back: Normal range of motion.  Skin:    General: Skin is warm and dry.  Neurological:     Mental Status: He is alert and oriented to person, place, and time.    ED Results / Procedures / Treatments   Labs (all labs ordered are listed, but only abnormal results are displayed) Labs Reviewed  CBC WITH DIFFERENTIAL/PLATELET - Abnormal; Notable for the following components:      Result Value   WBC 11.1 (*)    RBC 3.52 (*)    Hemoglobin 10.4 (*)    HCT 31.2 (*)    Platelets 651 (*)    Eosinophils Absolute 0.6 (*)    All other components within normal limits  COMPREHENSIVE METABOLIC PANEL - Abnormal; Notable for the following components:   Potassium 3.1 (*)    BUN 5 (*)    Creatinine, Ser 0.60 (*)    All other components within normal limits  I-STAT CHEM 8, ED - Abnormal; Notable for the following components:   Potassium 3.2 (*)    BUN <3 (*)    Creatinine, Ser 0.50 (*)    Hemoglobin 10.9 (*)    HCT 32.0 (*)    All other components within normal limits  LIPASE, BLOOD  URINALYSIS, ROUTINE W REFLEX MICROSCOPIC    EKG None  Radiology CT Abdomen Pelvis W Contrast  Result Date: 10/23/2021 CLINICAL DATA:  Abdominal pain. EXAM: CT ABDOMEN AND PELVIS WITH CONTRAST TECHNIQUE: Multidetector CT imaging of the abdomen and pelvis was performed using the standard protocol following bolus administration of intravenous contrast. CONTRAST:  105mL OMNIPAQUE IOHEXOL 350 MG/ML SOLN COMPARISON:  October 15, 2021 FINDINGS: Lower chest: No acute abnormality. Hepatobiliary: No focal liver abnormality is seen. No gallstones, gallbladder wall thickening, or biliary dilatation. Pancreas: Unremarkable. No pancreatic ductal dilatation or surrounding inflammatory  changes. Spleen: Normal in size without focal abnormality. Adrenals/Urinary Tract: Adrenal glands are unremarkable. Kidneys are normal, without renal calculi or hydronephrosis. A 1.5 cm diameter simple cyst is seen within the lateral aspect of the mid right kidney. Bladder is unremarkable. Stomach/Bowel: Stomach is within normal limits. The appendix is surgically absent. Multiple dilated small bowel loops are seen within the abdomen (maximum small bowel diameter of approximately 3.1 cm). A transition zone is seen within the anterior aspect of the mid abdomen, posterior to the region of the umbilicus (axial CT images 44 through 48, CT series 2). Mildly thickened small bowel loops are  seen distal to this transition zone. Vascular/Lymphatic: Aortic atherosclerosis. No enlarged abdominal or pelvic lymph nodes. Reproductive: Prostate is unremarkable. Other: No abdominal wall hernia or abnormality. No abdominopelvic ascites. The right-sided pelvic abscess seen on the prior study is no longer visualized. Musculoskeletal: No acute or significant osseous findings. IMPRESSION: 1. Small-bowel obstruction secondary to mild to moderate severity enteritis involving the mid to distal jejunum. 2. Interval resolution of the right-sided pelvic abscess seen on the prior study. 3. Aortic atherosclerosis. Aortic Atherosclerosis (ICD10-I70.0). Electronically Signed   By: Virgina Norfolk M.D.   On: 10/23/2021 23:05    Procedures Procedures   Medications Ordered in ED Medications  piperacillin-tazobactam (ZOSYN) IVPB 3.375 g (has no administration in time range)  lidocaine (XYLOCAINE) 2 % jelly 1 application (has no administration in time range)  HYDROmorphone (DILAUDID) injection 1 mg (has no administration in time range)  morphine 4 MG/ML injection 4 mg (4 mg Intravenous Given 10/23/21 2233)  iohexol (OMNIPAQUE) 350 MG/ML injection 80 mL (80 mLs Intravenous Contrast Given 10/23/21 2244)  diphenhydrAMINE (BENADRYL) injection  25 mg (25 mg Intravenous Given 10/23/21 2303)    ED Course  I have reviewed the triage vital signs and the nursing notes.  Pertinent labs & imaging results that were available during my care of the patient were reviewed by me and considered in my medical decision making (see chart for details).    MDM Rules/Calculators/A&P                           45 y.o. male presenting to the ED with abdominal pain recent hospital admission and discharge after a complicated course following appendectomy including ileus, SBO, abscess.  States pain returned today after he tripped and fell causing him to double over.  Does admit to some bowel movements but states they are not "normal".  He is afebrile and nontoxic.  Does have tenderness along the left abdomen and does appear distended.  Labs are overall reassuring.  CT with findings of recurrent SBO secondary to enteritis at distal jejunum.  Will replace NG tube.  Covid screen sent.  Discussed with general surgery, Dr. Gershon Crane-- agrees with NG tube, start zosyn given report of enteritis on CT.  He will admit for ongoing care.  Final Clinical Impression(s) / ED Diagnoses Final diagnoses:  SBO (small bowel obstruction) [facility])    Rx / DC Orders ED Discharge Orders     None        Larene Pickett, PA-C 10/23/21 2328    Truddie Hidden, MD 10/24/21 [phone]

## 2021-10-23 NOTE — ED Notes (Signed)
Labeled specimen cup provided to pt for urine collection per MD order. ENMiles 

## 2021-10-24 ENCOUNTER — Inpatient Hospital Stay (HOSPITAL_COMMUNITY): Payer: Self-pay

## 2021-10-24 LAB — RESP PANEL BY RT-PCR (FLU A&B, COVID) ARPGX2
Influenza A by PCR: NEGATIVE
Influenza B by PCR: NEGATIVE
SARS Coronavirus 2 by RT PCR: POSITIVE — AB

## 2021-10-24 LAB — BASIC METABOLIC PANEL
Anion gap: 8 (ref 5–15)
BUN: 5 mg/dL — ABNORMAL LOW (ref 6–20)
CO2: 29 mmol/L (ref 22–32)
Calcium: 8.7 mg/dL — ABNORMAL LOW (ref 8.9–10.3)
Chloride: 100 mmol/L (ref 98–111)
Creatinine, Ser: 0.72 mg/dL (ref 0.61–1.24)
GFR, Estimated: >60 mL/min (ref >60)
Glucose, Bld: 93 mg/dL (ref 70–99)
Potassium: 3.2 mmol/L — ABNORMAL LOW (ref 3.5–5.1)
Sodium: 137 mmol/L (ref 135–145)

## 2021-10-24 LAB — URINALYSIS, ROUTINE W REFLEX MICROSCOPIC
Bilirubin Urine: NEGATIVE
Glucose, UA: NEGATIVE mg/dL
Ketones, ur: NEGATIVE mg/dL
Leukocytes,Ua: NEGATIVE
Nitrite: NEGATIVE
Protein, ur: NEGATIVE mg/dL
Specific Gravity, Urine: >1.046 — ABNORMAL HIGH (ref 1.005–1.030)
pH: 5 (ref 5.0–8.0)

## 2021-10-24 LAB — CBC
HCT: 30 % — ABNORMAL LOW (ref 39.0–52.0)
Hemoglobin: 9.7 g/dL — ABNORMAL LOW (ref 13.0–17.0)
MCH: 29.7 pg (ref 26.0–34.0)
MCHC: 32.3 g/dL (ref 30.0–36.0)
MCV: 91.7 fL (ref 80.0–100.0)
Platelets: 557 10*3/uL — ABNORMAL HIGH (ref 150–400)
RBC: 3.27 MIL/uL — ABNORMAL LOW (ref 4.22–5.81)
RDW: 14.1 % (ref 11.5–15.5)
WBC: 9.2 10*3/uL (ref 4.0–10.5)
nRBC: 0 % (ref 0.0–0.2)

## 2021-10-24 LAB — MAGNESIUM: Magnesium: 1.7 mg/dL (ref 1.7–2.4)

## 2021-10-24 MED ORDER — ACETAMINOPHEN 325 MG PO TABS: 650.0000 mg | ORAL_TABLET | Freq: Four times a day (QID) | ORAL | Status: DC | PRN

## 2021-10-24 MED ORDER — MAGNESIUM SULFATE 2 GM/50ML IV SOLN
2.0000 g | Freq: Once | INTRAVENOUS | Status: AC
  Administered 2021-10-24: 2 g via INTRAVENOUS
  Filled 2021-10-24: qty 50

## 2021-10-24 MED ORDER — ONDANSETRON HCL 4 MG/2ML IJ SOLN: 4.0000 mg | Freq: Four times a day (QID) | INTRAMUSCULAR | Status: DC | PRN

## 2021-10-24 MED ORDER — VENLAFAXINE HCL ER 150 MG PO CP24
150.0000 mg | ORAL_CAPSULE | Freq: Every evening | ORAL | Status: DC
  Administered 2021-10-25 – 2021-10-27 (×3): 150 mg via ORAL
  Filled 2021-10-24 (×3): qty 1

## 2021-10-24 MED ORDER — HYDROMORPHONE HCL 1 MG/ML IJ SOLN
1.0000 mg | INTRAMUSCULAR | Status: DC | PRN
Start: 2021-10-24 — End: 2021-10-25
  Administered 2021-10-24 – 2021-10-25 (×12): 1 mg via INTRAVENOUS
  Filled 2021-10-24 (×13): qty 1

## 2021-10-24 MED ORDER — PANTOPRAZOLE SODIUM 40 MG IV SOLR
40.0000 mg | Freq: Every day | INTRAVENOUS | Status: AC
  Administered 2021-10-24 – 2021-10-26 (×3): 40 mg via INTRAVENOUS
  Filled 2021-10-24 (×3): qty 40

## 2021-10-24 MED ORDER — DOCUSATE SODIUM 100 MG PO CAPS
100.0000 mg | ORAL_CAPSULE | Freq: Two times a day (BID) | ORAL | Status: DC
  Administered 2021-10-24 – 2021-10-27 (×5): 100 mg via ORAL
  Filled 2021-10-24 (×7): qty 1

## 2021-10-24 MED ORDER — ENOXAPARIN SODIUM 40 MG/0.4ML IJ SOSY
40.0000 mg | PREFILLED_SYRINGE | INTRAMUSCULAR | Status: DC
  Administered 2021-10-24 – 2021-10-27 (×4): 40 mg via SUBCUTANEOUS
  Filled 2021-10-24 (×4): qty 0.4

## 2021-10-24 MED ORDER — PIPERACILLIN-TAZOBACTAM 3.375 G IVPB
3.3750 g | Freq: Three times a day (TID) | INTRAVENOUS | Status: DC
  Administered 2021-10-24 – 2021-10-27 (×11): 3.375 g via INTRAVENOUS
  Filled 2021-10-24 (×11): qty 50

## 2021-10-24 MED ORDER — POTASSIUM CHLORIDE 10 MEQ/100ML IV SOLN
10.0000 meq | INTRAVENOUS | Status: AC
Start: 2021-10-24 — End: 2021-10-24
  Administered 2021-10-24 (×3): 10 meq via INTRAVENOUS
  Filled 2021-10-24 (×3): qty 100

## 2021-10-24 MED ORDER — DIATRIZOATE MEGLUMINE & SODIUM 66-10 % PO SOLN
90.0000 mL | Freq: Once | ORAL | Status: AC
  Administered 2021-10-24: 90 mL via NASOGASTRIC
  Filled 2021-10-24: qty 90

## 2021-10-24 MED ORDER — ACETAMINOPHEN 650 MG RE SUPP: 650.0000 mg | Freq: Four times a day (QID) | RECTAL | Status: DC | PRN

## 2021-10-24 MED ORDER — METHOCARBAMOL 500 MG PO TABS
500.0000 mg | ORAL_TABLET | Freq: Four times a day (QID) | ORAL | Status: DC | PRN
  Administered 2021-10-24 (×2): 500 mg via ORAL
  Filled 2021-10-24 (×2): qty 1

## 2021-10-24 MED ORDER — DIPHENHYDRAMINE HCL 25 MG PO CAPS: 25.0000 mg | ORAL_CAPSULE | Freq: Four times a day (QID) | ORAL | Status: DC | PRN

## 2021-10-24 MED ORDER — ONDANSETRON 4 MG PO TBDP: 4.0000 mg | ORAL_TABLET | Freq: Four times a day (QID) | ORAL | Status: DC | PRN

## 2021-10-24 MED ORDER — MAGNESIUM SULFATE 50 % IJ SOLN: 2.0000 g | Freq: Once | INTRAMUSCULAR | Status: DC

## 2021-10-24 MED ORDER — DIPHENHYDRAMINE HCL 50 MG/ML IJ SOLN: 25.0000 mg | Freq: Four times a day (QID) | INTRAMUSCULAR | Status: DC | PRN

## 2021-10-24 MED ORDER — POTASSIUM CHLORIDE IN NACL 20-0.9 MEQ/L-% IV SOLN
INTRAVENOUS | Status: DC
  Filled 2021-10-24 (×8): qty 1000

## 2021-10-24 NOTE — H&P (Signed)
Admission Note  Joshua Cooper 02-04-1976  [id number].    Requesting MD: Quincy Carnes PA-C Chief Complaint/Reason for Consult: abdominal pain with concern for SBO HPI:  Patient is a 45 year old male who presented to [facility] with abdominal pain after falling yesterday. He underwent laparoscopic appendectomy on 09/30/57 which was complicated by perforation and post-op ileus and abscess. He was discharged from the hospital 10/18/21 and had been doing well until yesterday. Last BM was Monday night and was loose but not tarry or bloody. He is not passing flatus. Abdomen is distended tight feeling. He reports generalized soreness with intermittent sharp stabbing pain. Denies fever, chills, chest pain, SOB, change in urination.   ROS: Review of Systems  Constitutional:  Negative for chills and fever.  Respiratory:  Negative for shortness of breath and wheezing.   Cardiovascular:  Negative for chest pain and palpitations.  Gastrointestinal:  Positive for abdominal pain, diarrhea and nausea. Negative for constipation and vomiting.  Genitourinary:  Negative for dysuria, frequency and urgency.  Musculoskeletal:  Positive for falls.   History reviewed. No pertinent family history.  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 He has never used smokeless tobacco. He reports current alcohol use of about 2.0 standard drinks per week. He reports that he does not currently use drugs.  Allergies:  Allergies  Allergen Reactions   Morphine And Related Itching    (Not in a hospital admission)   Blood pressure (!) 155/97, pulse 78, temperature 98.1  F (36.7 C), temperature source Oral, resp. rate 16, height 5\' 11"  (1.803 m), weight 74.8 kg, SpO2 98 %. Physical Exam:  General: pleasant, WD, thin male who is laying in bed and appears uncomfortable HEENT: head is normocephalic, atraumatic.  Sclera are noninjected.  PERRL.  Ears and nose without any masses or lesions.  Mouth is pink and moist Heart: regular, rate, and rhythm.  Palpable radial and pedal pulses bilaterally Lungs: No auditory wheezing. Respiratory effort nonlabored Abd: firm, generalized ttp, distended, NGT with thin bile tinged drainage MS: all 4 extremities are symmetrical with no cyanosis, clubbing, or edema. Skin: warm and dry with no masses, lesions, or rashes Neuro: Cranial nerves 2-12 grossly intact, sensation is normal throughout Psych: A&Ox3 with an appropriate affect.   Results for orders placed or performed during the hospital encounter of 10/23/21 (from the past 48 hour(s))  CBC with Differential     Status: Abnormal   Collection Time: 10/23/21  9:42 PM  Result Value Ref Range   WBC 11.1 (H) 4.0 - 10.5 K/uL   RBC 3.52 (L) 4.22 - 5.81 MIL/uL   Hemoglobin 10.4 (L) 13.0 - 17.0 g/dL   HCT 31.2 (L) 39.0 - 52.0 %   MCV 88.6 80.0 - 100.0 fL   MCH 29.5 26.0 - 34.0 pg   MCHC 33.3 30.0 - 36.0 g/dL   RDW 13.9 11.5 - 15.5 %   Platelets 651 (H) 150 - 400 K/uL   nRBC 0.0 0.0 - 0.2 %   Neutrophils Relative % 58 %   Neutro Abs 6.3 1.7 - 7.7 K/uL   Lymphocytes  Relative 27 %   Lymphs Abs 3.0 0.7 - 4.0 K/uL   Monocytes Relative 9 %   Monocytes Absolute 1.0 0.1 - 1.0 K/uL   Eosinophils Relative 5 %   Eosinophils Absolute 0.6 (H) 0.0 - 0.5 K/uL   Basophils Relative 1 %   Basophils Absolute 0.1 0.0 - 0.1 K/uL   Immature Granulocytes 0 %   Abs Immature Granulocytes 0.03 0.00 - 0.07 K/uL    Comment: Performed at [facility], [place] [address], [place], [place] [address]  Comprehensive metabolic panel     Status: Abnormal   Collection Time: 10/23/21  9:42  PM  Result Value Ref Range   Sodium 137 135 - 145 mmol/L   Potassium 3.1 (L) 3.5 - 5.1 mmol/L   Chloride 102 98 - 111 mmol/L   CO2 26 22 - 32 mmol/L   Glucose, Bld 96 70 - 99 mg/dL    Comment: Glucose reference range applies only to samples taken after fasting for at least 8 hours.   BUN 5 (L) 6 - 20 mg/dL   Creatinine, Ser 0.60 (L) 0.61 - 1.24 mg/dL   Calcium 9.0 8.9 - 10.3 mg/dL   Total Protein 7.8 6.5 - 8.1 g/dL   Albumin 3.6 3.5 - 5.0 g/dL   AST 29 15 - 41 U/L   ALT 19 0 - 44 U/L   Alkaline Phosphatase 76 38 - 126 U/L   Total Bilirubin 0.5 0.3 - 1.2 mg/dL   GFR, Estimated >60 >60 mL/min    Comment: (NOTE) Calculated using the CKD-EPI Creatinine Equation (2021)    Anion gap 9 5 - 15    Comment: Performed at [facility], [place] [address] [place], [place] [address]  Lipase, blood     Status: None   Collection Time: 10/23/21  9:42 PM  Result Value Ref Range   Lipase 51 11 - 51 U/L    Comment: Performed at [facility], [place] [address], [place], [place] [address]  I-Stat Chem 8, ED     Status: Abnormal   Collection Time: 10/23/21 10:02 PM  Result Value Ref Range   Sodium 139 135 - 145 mmol/L   Potassium 3.2 (L) 3.5 - 5.1 mmol/L   Chloride 100 98 - 111 mmol/L   BUN <3 (L) 6 - 20 mg/dL   Creatinine, Ser 0.50 (L) 0.61 - 1.24 mg/dL   Glucose, Bld 96 70 - 99 mg/dL    Comment: Glucose reference range applies only to samples taken after fasting for at least 8 hours.   Calcium, Ion 1.17 1.15 - 1.40 mmol/L   TCO2 27 22 - 32 mmol/L   Hemoglobin 10.9 (L) 13.0 - 17.0 g/dL   HCT 32.0 (L) 39.0 - 52.0 %  Resp Panel by RT-PCR (Flu A&B, Covid) Nasopharyngeal Swab     Status: Abnormal   Collection Time: 10/23/21 11:21 PM   Specimen: Nasopharyngeal Swab; Nasopharyngeal(NP) swabs in vial transport medium  Result Value Ref Range   SARS Coronavirus 2 by RT PCR POSITIVE (A) NEGATIVE    Comment: RESULT CALLED TO, READ BACK BY AND VERIFIED WITH: JENNIFER  SMITH [email or web address]  ON 10.26.22 BY TCALDWELL MT.    Influenza A by PCR NEGATIVE NEGATIVE   Influenza B by PCR NEGATIVE NEGATIVE    Comment: Performed at [facility], [place] [address] [place],  [address]  Basic metabolic panel     Status: Abnormal   Collection Time: 10/24/21  5:13 AM  Result Value Ref Range   Sodium 137 135 - 145 mmol/L   Potassium 3.2 (L) 3.5 - 5.1 mmol/L   Chloride 100 98 - 111 mmol/L   CO2 29 22 - 32 mmol/L   Glucose, Bld 93 70 - 99 mg/dL    Comment: Glucose reference range applies only to samples taken after fasting for at least 8 hours.   BUN 5 (L) 6 - 20 mg/dL   Creatinine, Ser 0.72 0.61 - 1.24 mg/dL   Calcium 8.7 (L) 8.9 - 10.3 mg/dL   GFR, Estimated >60 >60 mL/min    Comment: (NOTE) Calculated using the CKD-EPI Creatinine Equation (2021)    Anion gap 8 5 - 15    Comment: Performed at [facility], [place] [address], [place], [place] [address]  CBC     Status: Abnormal   Collection Time: 10/24/21  5:13 AM  Result Value Ref Range   WBC 9.2 4.0 - 10.5 K/uL   RBC 3.27 (L) 4.22 - 5.81 MIL/uL   Hemoglobin 9.7 (L) 13.0 - 17.0 g/dL   HCT 30.0 (L) 39.0 - 52.0 %   MCV 91.7 80.0 - 100.0 fL   MCH 29.7 26.0 - 34.0 pg   MCHC 32.3 30.0 - 36.0 g/dL   RDW 14.1 11.5 - 15.5 %   Platelets 557 (H) 150 - 400 K/uL   nRBC 0.0 0.0 - 0.2 %    Comment: Performed at [facility], [place] [address], [place], [place] [address]   CT Abdomen Pelvis W Contrast  Result Date: 10/23/2021 CLINICAL DATA:  Abdominal pain. EXAM: CT ABDOMEN AND PELVIS WITH CONTRAST TECHNIQUE: Multidetector CT imaging of the abdomen and pelvis was performed using the standard protocol following bolus administration of intravenous contrast. CONTRAST:  90mL OMNIPAQUE IOHEXOL 350 MG/ML SOLN COMPARISON:  October 15, 2021 FINDINGS: Lower chest: No acute abnormality. Hepatobiliary: No focal liver abnormality is seen. No gallstones, gallbladder wall thickening, or  biliary dilatation. Pancreas: Unremarkable. No pancreatic ductal dilatation or surrounding inflammatory changes. Spleen: Normal in size without focal abnormality. Adrenals/Urinary Tract: Adrenal glands are unremarkable. Kidneys are normal, without renal calculi or hydronephrosis. A 1.5 cm diameter simple cyst is seen within the lateral aspect of the mid right kidney. Bladder is unremarkable. Stomach/Bowel: Stomach is within normal limits. The appendix is surgically absent. Multiple dilated small bowel loops are seen within the abdomen (maximum small bowel diameter of approximately 3.1 cm). A transition zone is seen within the anterior aspect of the mid abdomen, posterior to the region of the umbilicus (axial CT images 44 through 48, CT series 2). Mildly thickened small bowel loops are seen distal to this transition zone. Vascular/Lymphatic: Aortic atherosclerosis. No enlarged abdominal or pelvic lymph nodes. Reproductive: Prostate is unremarkable. Other: No abdominal wall hernia or abnormality. No abdominopelvic ascites. The right-sided pelvic abscess seen on the prior study is no longer visualized. Musculoskeletal: No acute or significant osseous findings. IMPRESSION: 1. Small-bowel obstruction secondary to mild to moderate severity enteritis involving the mid to distal jejunum. 2. Interval resolution of the right-sided pelvic abscess seen on the prior study. 3. Aortic atherosclerosis. Aortic Atherosclerosis (ICD10-I70.0). Electronically Signed   By: Virgina Norfolk M.D.   On: 10/23/2021 23:05   DG Abd Portable 1V  Result Date: 10/24/2021 CLINICAL DATA:  Nasogastric tube placement. EXAM: PORTABLE ABDOMEN - 1 VIEW COMPARISON:  October 06, 2021 FINDINGS: A nasogastric tube is seen with its distal tip overlying the expected region of the gastric fundus. The bowel  gas pattern is normal. Radiopaque contrast is seen within the urinary bladder and bilateral renal collecting systems. No radio-opaque calculi or other  significant radiographic abnormality are seen. IMPRESSION: Nasogastric tube positioning, as described above. Electronically Signed   By: Virgina Norfolk M.D.   On: 10/24/2021 00:16      Assessment/Plan S/p laparoscopic appendectomy 10/04/21 for perforated appendicitis  Pelvic abscess, now resolved SBO secondary to enteritis  - CT yesterday with SBO with mild to moderate enteritis involving mid to distal jejunum, resolution in right sided pelvic abscess - WBC was 11 on admission, down to 9.2 this AM; afebrile - repeat film this AM with gas throughout colon and fairly normal bowel gas pattern - not sure why patient is so tender on exam, will discuss with MD  COVID+ - asymptomatic from a respiratory standpoint  FEN: NPO, IVF, NGT to LIWS VTE: LMWH ID: [email or web address]   Hx of testicular CA Depression  Remote rib fractures Tobacco use   Norm Parcel, [facility] 10/24/2021, 7:28 AM Please see Amion for pager number during day hours 7:00am-4:30pm

## 2021-10-25 ENCOUNTER — Inpatient Hospital Stay (HOSPITAL_COMMUNITY): Payer: Self-pay

## 2021-10-25 LAB — CBC
HCT: 29.5 % — ABNORMAL LOW (ref 39.0–52.0)
Hemoglobin: 9.4 g/dL — ABNORMAL LOW (ref 13.0–17.0)
MCH: 29.1 pg (ref 26.0–34.0)
MCHC: 31.9 g/dL (ref 30.0–36.0)
MCV: 91.3 fL (ref 80.0–100.0)
Platelets: 416 10*3/uL — ABNORMAL HIGH (ref 150–400)
RBC: 3.23 MIL/uL — ABNORMAL LOW (ref 4.22–5.81)
RDW: 14.2 % (ref 11.5–15.5)
WBC: 8.4 10*3/uL (ref 4.0–10.5)
nRBC: 0 % (ref 0.0–0.2)

## 2021-10-25 LAB — BASIC METABOLIC PANEL
Anion gap: 7 (ref 5–15)
BUN: 6 mg/dL (ref 6–20)
CO2: 24 mmol/L (ref 22–32)
Calcium: 8.4 mg/dL — ABNORMAL LOW (ref 8.9–10.3)
Chloride: 106 mmol/L (ref 98–111)
Creatinine, Ser: 0.68 mg/dL (ref 0.61–1.24)
GFR, Estimated: >60 mL/min (ref >60)
Glucose, Bld: 85 mg/dL (ref 70–99)
Potassium: 3.9 mmol/L (ref 3.5–5.1)
Sodium: 137 mmol/L (ref 135–145)

## 2021-10-25 LAB — MAGNESIUM: Magnesium: 1.8 mg/dL (ref 1.7–2.4)

## 2021-10-25 MED ORDER — ACETAMINOPHEN 325 MG PO TABS
650.0000 mg | ORAL_TABLET | Freq: Four times a day (QID) | ORAL | Status: DC
  Administered 2021-10-25 – 2021-10-27 (×9): 650 mg via ORAL
  Filled 2021-10-25 (×9): qty 2

## 2021-10-25 MED ORDER — OXYCODONE HCL 5 MG PO TABS
5.0000 mg | ORAL_TABLET | ORAL | Status: DC | PRN
  Administered 2021-10-25 – 2021-10-27 (×11): 10 mg via ORAL
  Filled 2021-10-25 (×12): qty 2

## 2021-10-25 MED ORDER — HYDROMORPHONE HCL 1 MG/ML IJ SOLN
0.5000 mg | INTRAMUSCULAR | Status: DC | PRN
  Administered 2021-10-25 – 2021-10-26 (×4): 0.5 mg via INTRAVENOUS
  Filled 2021-10-25 (×5): qty 0.5

## 2021-10-25 MED ORDER — NICOTINE 14 MG/24HR TD PT24
14.0000 mg | MEDICATED_PATCH | Freq: Every day | TRANSDERMAL | Status: DC
  Administered 2021-10-25 – 2021-10-27 (×3): 14 mg via TRANSDERMAL
  Filled 2021-10-25 (×3): qty 1

## 2021-10-25 MED ORDER — METHOCARBAMOL 500 MG PO TABS
500.0000 mg | ORAL_TABLET | Freq: Four times a day (QID) | ORAL | Status: DC
  Administered 2021-10-25 – 2021-10-27 (×11): 500 mg via ORAL
  Filled 2021-10-25 (×11): qty 1

## 2021-10-25 MED ORDER — MAGNESIUM SULFATE 2 GM/50ML IV SOLN
2.0000 g | Freq: Once | INTRAVENOUS | Status: AC
  Administered 2021-10-25: 2 g via INTRAVENOUS
  Filled 2021-10-25: qty 50

## 2021-10-25 NOTE — Progress Notes (Signed)
Progress Note     Subjective: Pt reports he is still having abdominal pain and RN reports he is requesting dilaudid ~q2h. He is passing flatus and had a small liquid bowel movement. He complains that NGT is very uncomfortable and making him feel congested.    Objective: Vital signs in last 24 hours: Temp:  [98.3 F (36.8 C)-99.6 F (37.6 C)] 99.1 F (37.3 C) (10/27 0451) Pulse Rate:  [67-97] 90 (10/27 0734) Resp:  [14-20] 14 (10/27 0734) BP: (133-166)/(84-105) 150/97 (10/27 0734) SpO2:  [93 %-99 %] 97 % (10/27 0451) Last BM Date: 10/22/21  Intake/Output from previous day: 10/26 0701 - 10/27 0700 In: 3047.4 [P.O.:60; I.V.:2845; IV Piggyback:142.4] Out: 450 [Emesis/NG output:450] Intake/Output this shift: Total I/O In: 720 [P.O.:720] Out: 400 [Urine:400]  PE: General: pleasant, WD, thin male who is laying in bed in NAD Heart: regular, rate, and rhythm.  Lungs: No auditory wheezing. Respiratory effort nonlabored Abd: soft, mild generalized ttp, non distended, NGT with bilious drainage in canister  MS: all 4 extremities are symmetrical with no cyanosis, clubbing, or edema. Skin: warm and dry with no masses, lesions, or rashes Neuro: Cranial nerves 2-12 grossly intact, sensation is normal throughout Psych: A&Ox3 with an appropriate affect.   Lab Results:  Recent Labs    10/24/21 0513 10/25/21 0436  WBC 9.2 8.4  HGB 9.7* 9.4*  HCT 30.0* 29.5*  PLT 557* 416*   BMET Recent Labs    10/24/21 0513 10/25/21 0436  NA 137 137  K 3.2* 3.9  CL 100 106  CO2 29 24  GLUCOSE 93 85  BUN 5* 6  CREATININE 0.72 0.68  CALCIUM 8.7* 8.4*   PT/INR No results for input(s): LABPROT, INR in the last 72 hours. CMP     Component Value Date/Time   NA 137 10/25/2021 0436   K 3.9 10/25/2021 0436   CL 106 10/25/2021 0436   CO2 24 10/25/2021 0436   GLUCOSE 85 10/25/2021 0436   BUN 6 10/25/2021 0436   CREATININE 0.68 10/25/2021 0436   CALCIUM 8.4 (L) 10/25/2021 0436   PROT  7.8 10/23/2021 2142   ALBUMIN 3.6 10/23/2021 2142   AST 29 10/23/2021 2142   ALT 19 10/23/2021 2142   ALKPHOS 76 10/23/2021 2142   BILITOT 0.5 10/23/2021 2142   GFRNONAA >60 10/25/2021 0436   Lipase     Component Value Date/Time   LIPASE 51 10/23/2021 2142       Studies/Results: CT Abdomen Pelvis W Contrast  Result Date: 10/23/2021 CLINICAL DATA:  Abdominal pain. EXAM: CT ABDOMEN AND PELVIS WITH CONTRAST TECHNIQUE: Multidetector CT imaging of the abdomen and pelvis was performed using the standard protocol following bolus administration of intravenous contrast. CONTRAST:  67mL OMNIPAQUE IOHEXOL 350 MG/ML SOLN COMPARISON:  October 15, 2021 FINDINGS: Lower chest: No acute abnormality. Hepatobiliary: No focal liver abnormality is seen. No gallstones, gallbladder wall thickening, or biliary dilatation. Pancreas: Unremarkable. No pancreatic ductal dilatation or surrounding inflammatory changes. Spleen: Normal in size without focal abnormality. Adrenals/Urinary Tract: Adrenal glands are unremarkable. Kidneys are normal, without renal calculi or hydronephrosis. A 1.5 cm diameter simple cyst is seen within the lateral aspect of the mid right kidney. Bladder is unremarkable. Stomach/Bowel: Stomach is within normal limits. The appendix is surgically absent. Multiple dilated small bowel loops are seen within the abdomen (maximum small bowel diameter of approximately 3.1 cm). A transition zone is seen within the anterior aspect of the mid abdomen, posterior to the region of the umbilicus (  axial CT images 44 through 48, CT series 2). Mildly thickened small bowel loops are seen distal to this transition zone. Vascular/Lymphatic: Aortic atherosclerosis. No enlarged abdominal or pelvic lymph nodes. Reproductive: Prostate is unremarkable. Other: No abdominal wall hernia or abnormality. No abdominopelvic ascites. The right-sided pelvic abscess seen on the prior study is no longer visualized. Musculoskeletal: No  acute or significant osseous findings. IMPRESSION: 1. Small-bowel obstruction secondary to mild to moderate severity enteritis involving the mid to distal jejunum. 2. Interval resolution of the right-sided pelvic abscess seen on the prior study. 3. Aortic atherosclerosis. Aortic Atherosclerosis (ICD10-I70.0). Electronically Signed   By: Virgina Norfolk M.D.   On: 10/23/2021 23:05   DG Abd Portable 1V-Small Bowel Obstruction Protocol-initial, 8 hr delay  Result Date: 10/24/2021 CLINICAL DATA:  Small bowel obstruction EXAM: PORTABLE ABDOMEN - 1 VIEW COMPARISON:  Portable exam 2006 hours compared to 0840 hours FINDINGS: Oral contrast is present throughout the colon to the rectum. Few mildly prominent loops of small bowel in the mid abdomen. Nasogastric tube coiled in stomach. No bowel wall thickening. No acute osseous findings. IMPRESSION: Oral contrast has passed through the bowel and is located throughout the colon to the rectum indicating absence of high-grade obstruction. Persistent mild gaseous distension of small bowel loops in mid abdomen. Electronically Signed   By: Lavonia Dana M.D.   On: 10/24/2021 20:24   DG Abd Portable 1V  Result Date: 10/24/2021 CLINICAL DATA:  Abdominal pain, recent appendectomy EXAM: PORTABLE ABDOMEN - 1 VIEW COMPARISON:  10/23/2021 FINDINGS: Nonobstructive pattern of bowel gas gas present to the rectum. No significant burden of stool. No free air in the abdomen. Esophagogastric tube with tip and side port below the diaphragm. Excreted contrast in the urinary bladder. IMPRESSION: 1. Nonobstructive pattern of bowel gas. No significant burden of stool. No free air in the abdomen. 2.  Esophagogastric tube with tip and side port below the diaphragm. Electronically Signed   By: Delanna Ahmadi M.D.   On: 10/24/2021 09:28   DG Abd Portable 1V  Result Date: 10/24/2021 CLINICAL DATA:  Nasogastric tube placement. EXAM: PORTABLE ABDOMEN - 1 VIEW COMPARISON:  October 06, 2021  FINDINGS: A nasogastric tube is seen with its distal tip overlying the expected region of the gastric fundus. The bowel gas pattern is normal. Radiopaque contrast is seen within the urinary bladder and bilateral renal collecting systems. No radio-opaque calculi or other significant radiographic abnormality are seen. IMPRESSION: Nasogastric tube positioning, as described above. Electronically Signed   By: Virgina Norfolk M.D.   On: 10/24/2021 00:16    Anti-infectives: Anti-infectives (From admission, onward)    Start     Dose/Rate Route Frequency Ordered Stop   10/24/21 0100  piperacillin-tazobactam (ZOSYN) IVPB 3.375 g        3.375 g 12.5 mL/hr over 240 Minutes Intravenous Every 8 hours 10/24/21 0052 10/30/21 2159   10/23/21 2330  piperacillin-tazobactam (ZOSYN) IVPB 3.375 g        3.375 g 100 mL/hr over 30 Minutes Intravenous  Once 10/23/21 2321 10/24/21 0020        Assessment/Plan  S/p laparoscopic appendectomy 10/04/21 for perforated appendicitis  Pelvic abscess, now resolved SBO secondary to enteritis  - CT 10/25 with SBO with mild to moderate enteritis involving mid to distal jejunum, resolution in right sided pelvic abscess - WBC was 11 on admission, down to 9.4 this AM; afebrile - repeat film 10/26 with gas throughout colon and fairly normal bowel gas pattern - small bowel  protocol - 8 hr delay with contrast to rectum - clamp and clears this am - consider pulling NGT this afternoon if 24h film ok and tolerating CLD   COVID+ - asymptomatic from a respiratory standpoint   FEN: CLD, NGT clamped, IVF VTE: LMWH ID: [email or web address]    Hx of testicular CA Depression  Remote rib fractures Tobacco use      LOS: 2 days    Barkley Boards, [facility] 10/25/2021, 9:41 AM Please see Amion for pager number during day hours 7:00am-4:30pm

## 2021-10-26 LAB — CBC
HCT: 28.9 % — ABNORMAL LOW (ref 39.0–52.0)
Hemoglobin: 9.3 g/dL — ABNORMAL LOW (ref 13.0–17.0)
MCH: 29.4 pg (ref 26.0–34.0)
MCHC: 32.2 g/dL (ref 30.0–36.0)
MCV: 91.5 fL (ref 80.0–100.0)
Platelets: 331 10*3/uL (ref 150–400)
RBC: 3.16 MIL/uL — ABNORMAL LOW (ref 4.22–5.81)
RDW: 14 % (ref 11.5–15.5)
WBC: 7.3 10*3/uL (ref 4.0–10.5)
nRBC: 0 % (ref 0.0–0.2)

## 2021-10-26 LAB — BASIC METABOLIC PANEL
Anion gap: 7 (ref 5–15)
BUN: 5 mg/dL — ABNORMAL LOW (ref 6–20)
CO2: 22 mmol/L (ref 22–32)
Calcium: 8.2 mg/dL — ABNORMAL LOW (ref 8.9–10.3)
Chloride: 105 mmol/L (ref 98–111)
Creatinine, Ser: 0.63 mg/dL (ref 0.61–1.24)
GFR, Estimated: >60 mL/min (ref >60)
Glucose, Bld: 106 mg/dL — ABNORMAL HIGH (ref 70–99)
Potassium: 3.5 mmol/L (ref 3.5–5.1)
Sodium: 134 mmol/L — ABNORMAL LOW (ref 135–145)

## 2021-10-26 NOTE — Progress Notes (Addendum)
Progress Note     Subjective: He continues to have abdominal pain but stable. He is tolerating full liquids and having several bowel movements without nausea or emesis. Continues to have congestion   Objective: Vital signs in last 24 hours: Temp:  [98.3 F (36.8 C)-98.7 F (37.1 C)] 98.3 F (36.8 C) (10/28 0425) Pulse Rate:  [72-83] 72 (10/28 0425) Resp:  [17-18] 18 (10/28 0425) BP: (132-155)/(88-107) 139/95 (10/28 0425) SpO2:  [97 %-98 %] 98 % (10/28 0425) Last BM Date: 10/25/21  Intake/Output from previous day: 10/27 0701 - 10/28 0700 In: 4346.6 [P.O.:2040; I.V.:2129.1; IV Piggyback:177.5] Out: 1400 [Urine:1400] Intake/Output this shift: No intake/output data recorded.  PE: General: pleasant, WD, thin male who is laying in bed in NAD Heart: regular, rate, and rhythm.  Lungs: CTAB. Respiratory effort nonlabored Abd: soft, mild generalized ttp, non distended MS: all 4 extremities are symmetrical with no cyanosis, clubbing, or edema. Skin: warm and dry with no masses, lesions, or rashes Psych: A&Ox3 with an appropriate affect.   Lab Results:  Recent Labs    10/25/21 0436 10/26/21 0409  WBC 8.4 7.3  HGB 9.4* 9.3*  HCT 29.5* 28.9*  PLT 416* 331    BMET Recent Labs    10/25/21 0436 10/26/21 0409  NA 137 134*  K 3.9 3.5  CL 106 105  CO2 24 22  GLUCOSE 85 106*  BUN 6 5*  CREATININE 0.68 0.63  CALCIUM 8.4* 8.2*    PT/INR No results for input(s): LABPROT, INR in the last 72 hours. CMP     Component Value Date/Time   NA 134 (L) 10/26/2021 0409   K 3.5 10/26/2021 0409   CL 105 10/26/2021 0409   CO2 22 10/26/2021 0409   GLUCOSE 106 (H) 10/26/2021 0409   BUN 5 (L) 10/26/2021 0409   CREATININE 0.63 10/26/2021 0409   CALCIUM 8.2 (L) 10/26/2021 0409   PROT 7.8 10/23/2021 2142   ALBUMIN 3.6 10/23/2021 2142   AST 29 10/23/2021 2142   ALT 19 10/23/2021 2142   ALKPHOS 76 10/23/2021 2142   BILITOT 0.5 10/23/2021 2142   GFRNONAA >60 10/26/2021 0409    Lipase     Component Value Date/Time   LIPASE 51 10/23/2021 2142       Studies/Results: DG Abd Portable 1V-Small Bowel Obstruction Protocol-24 hr delay  Result Date: 10/25/2021 CLINICAL DATA:  24 hour delay following SBO EXAM: PORTABLE ABDOMEN - 1 VIEW COMPARISON:  10/24/2021 abdominal radiograph FINDINGS: Enteric tube terminates in the gastric fundus. Mildly dilated central and left small bowel loops up to 3.6 cm diameter, decreased from 4.0 cm. Oral contrast is retained in the colon and rectum. No evidence of pneumatosis or pneumoperitoneum. Clear lung bases. IMPRESSION: Mildly dilated central and left small bowel loops, decreased, with oral contrast retained in the colon and rectum, consistent with resolving small-bowel obstruction. Electronically Signed   By: Ilona Sorrel M.D.   On: 10/25/2021 15:47   DG Abd Portable 1V-Small Bowel Obstruction Protocol-initial, 8 hr delay  Result Date: 10/24/2021 CLINICAL DATA:  Small bowel obstruction EXAM: PORTABLE ABDOMEN - 1 VIEW COMPARISON:  Portable exam 2006 hours compared to 0840 hours FINDINGS: Oral contrast is present throughout the colon to the rectum. Few mildly prominent loops of small bowel in the mid abdomen. Nasogastric tube coiled in stomach. No bowel wall thickening. No acute osseous findings. IMPRESSION: Oral contrast has passed through the bowel and is located throughout the colon to the rectum indicating absence of high-grade obstruction. Persistent  mild gaseous distension of small bowel loops in mid abdomen. Electronically Signed   By: Lavonia Dana M.D.   On: 10/24/2021 20:24   DG Abd Portable 1V  Result Date: 10/24/2021 CLINICAL DATA:  Abdominal pain, recent appendectomy EXAM: PORTABLE ABDOMEN - 1 VIEW COMPARISON:  10/23/2021 FINDINGS: Nonobstructive pattern of bowel gas gas present to the rectum. No significant burden of stool. No free air in the abdomen. Esophagogastric tube with tip and side port below the diaphragm. Excreted  contrast in the urinary bladder. IMPRESSION: 1. Nonobstructive pattern of bowel gas. No significant burden of stool. No free air in the abdomen. 2.  Esophagogastric tube with tip and side port below the diaphragm. Electronically Signed   By: Delanna Ahmadi M.D.   On: 10/24/2021 09:28    Anti-infectives: Anti-infectives (From admission, onward)    Start     Dose/Rate Route Frequency Ordered Stop   10/24/21 0100  piperacillin-tazobactam (ZOSYN) IVPB 3.375 g        3.375 g 12.5 mL/hr over 240 Minutes Intravenous Every 8 hours 10/24/21 0052 10/30/21 2159   10/23/21 2330  piperacillin-tazobactam (ZOSYN) IVPB 3.375 g        3.375 g 100 mL/hr over 30 Minutes Intravenous  Once 10/23/21 2321 10/24/21 0020        Assessment/Plan  S/p laparoscopic appendectomy 10/04/21 for perforated appendicitis  Pelvic abscess, now resolved SBO secondary to enteritis  - CT 10/25 with SBO with mild to moderate enteritis involving mid to distal jejunum, resolution in right sided pelvic abscess - WBC was 11 on admission, down to 7.3 this AM; afebrile - repeat film 10/26 with gas throughout colon and fairly normal bowel gas pattern - small bowel protocol - 24 hr delay with improved SBO and contrast to rectum - NGT out pm yesterday. Tolerated FLD. Advance to soft.    COVID+ - asymptomatic from a respiratory standpoint  Dispo: possibly home tomorrow if tolerating soft diet   FEN: soft, IVF VTE: LMWH ID: Zosyn    Hx of testicular CA Depression  Remote rib fractures Tobacco use      LOS: 3 days    Richard Miu, [facility] 10/26/2021, 8:04 AM Please see Amion for pager number during day hours 7:00am-4:30pm
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
## 2021-10-26 NOTE — Plan of Care (Signed)
  Problem: Education: Goal: Knowledge of risk factors and measures for prevention of condition will improve Outcome: Progressing   Problem: Coping: Goal: Psychosocial and spiritual needs will be supported Outcome: Progressing   Problem: Respiratory: Goal: Will maintain a patent airway Outcome: Progressing Goal: Complications related to the disease process, condition or treatment will be avoided or minimized Outcome: Progressing   

## 2021-10-27 MED ORDER — OXYCODONE HCL 5 MG PO TABS: 5.0000 mg | ORAL_TABLET | Freq: Four times a day (QID) | ORAL | 0 refills | Status: DC | PRN

## 2021-10-27 NOTE — Discharge Summary (Signed)
Physician Discharge Summary  Patient ID: Joshua Cooper MRN: [id number] DOB/AGE: 1976-06-26 45 y.o.  Admit date: 10/23/2021 Discharge date: 10/27/2021  Admission Diagnoses:  Perforated appendicitis   Small bowel obstruction  Discharge Diagnoses: same Active Problems:   Small bowel obstruction [facility])   Discharged Condition: good  Hospital Course: Patient is a 45 year old male who presented to [facility] with abdominal pain on 10/23/21.  He underwent laparoscopic appendectomy on 81/1/03 which was complicated by perforation and post-op ileus and abscess. He was discharged from the hospital 10/18/21 and had been doing well until the day prior to admission. He was diagnosed with an early SBO.  He was admitted and placed to NG suction.  He also tested positive for COVID.  He began having bowel movements on hospital day #1, which improved over the last couple of days.  He continues to have some abdominal pain, but is now tolerating a diet with several loose Bm yesterday.    Significant Diagnostic Studies:  CLINICAL DATA:  Abdominal pain.   EXAM: CT ABDOMEN AND PELVIS WITH CONTRAST   TECHNIQUE: Multidetector CT imaging of the abdomen and pelvis was performed using the standard protocol following bolus administration of intravenous contrast.   CONTRAST:  64mL OMNIPAQUE IOHEXOL 350 MG/ML SOLN   COMPARISON:  October 15, 2021   FINDINGS: Lower chest: No acute abnormality.   Hepatobiliary: No focal liver abnormality is seen. No gallstones, gallbladder wall thickening, or biliary dilatation.   Pancreas: Unremarkable. No pancreatic ductal dilatation or surrounding inflammatory changes.   Spleen: Normal in size without focal abnormality.   Adrenals/Urinary Tract: Adrenal glands are unremarkable. Kidneys are normal, without renal calculi or hydronephrosis. A 1.5 cm diameter simple cyst is seen within the lateral aspect of the mid right kidney. Bladder is unremarkable.   Stomach/Bowel:  Stomach is within normal limits. The appendix is surgically absent. Multiple dilated small bowel loops are seen within the abdomen (maximum small bowel diameter of approximately 3.1 cm). A transition zone is seen within the anterior aspect of the mid abdomen, posterior to the region of the umbilicus (axial CT images 44 through 48, CT series 2). Mildly thickened small bowel loops are seen distal to this transition zone.   Vascular/Lymphatic: Aortic atherosclerosis. No enlarged abdominal or pelvic lymph nodes.   Reproductive: Prostate is unremarkable.   Other: No abdominal wall hernia or abnormality.   No abdominopelvic ascites. The right-sided pelvic abscess seen on the prior study is no longer visualized.   Musculoskeletal: No acute or significant osseous findings.   IMPRESSION: 1. Small-bowel obstruction secondary to mild to moderate severity enteritis involving the mid to distal jejunum. 2. Interval resolution of the right-sided pelvic abscess seen on the prior study. 3. Aortic atherosclerosis.   Aortic Atherosclerosis (ICD10-I70.0).     Electronically Signed   By: Virgina Norfolk M.D.   On: 10/23/2021 23:05  CLINICAL DATA:  24 hour delay following SBO   EXAM: PORTABLE ABDOMEN - 1 VIEW   COMPARISON:  10/24/2021 abdominal radiograph   FINDINGS: Enteric tube terminates in the gastric fundus. Mildly dilated central and left small bowel loops up to 3.6 cm diameter, decreased from 4.0 cm. Oral contrast is retained in the colon and rectum. No evidence of pneumatosis or pneumoperitoneum. Clear lung bases.   IMPRESSION: Mildly dilated central and left small bowel loops, decreased, with oral contrast retained in the colon and rectum, consistent with resolving small-bowel obstruction.     Electronically Signed   By: Janina Mayo.D.  On: 10/25/2021 15:47    Treatments: IV hydration and nasogastric suction  Discharge Exam: Blood pressure (!) 143/102, pulse 64,  temperature 97.6 F (36.4 C), temperature source Oral, resp. rate 17, height 5\' 11"  (1.803 m), weight 74.8 kg, SpO2 96 %. General: pleasant, WD, thin male who is laying in bed in NAD Heart: regular, rate, and rhythm.  Lungs: CTAB. Respiratory effort nonlabored Abd: soft, mild generalized ttp, non distended MS: all 4 extremities are symmetrical with no cyanosis, clubbing, or edema. Skin: warm and dry with no masses, lesions, or rashes Psych: A&Ox3 with an appropriate affect.  Disposition: Discharge disposition: 01-Home or Self Care      Discharge Instructions     Call MD for:  persistant nausea and vomiting   Complete by: As directed    Call MD for:  redness, tenderness, or signs of infection (pain, swelling, redness, odor or green/yellow discharge around incision site)   Complete by: As directed    Call MD for:  severe uncontrolled pain   Complete by: As directed    Call MD for:  temperature >100.4   Complete by: As directed    Diet general   Complete by: As directed    Driving Restrictions   Complete by: As directed    Do not drive while taking pain medications   Increase activity slowly   Complete by: As directed    May shower / Bathe   Complete by: As directed       Allergies as of 10/27/2021       Reactions   Morphine And Related Itching        Medication List     TAKE these medications    acetaminophen 500 MG tablet Commonly known as: TYLENOL Take 2 tablets (1,000 mg total) by mouth every 8 (eight) hours as needed.   amoxicillin-clavulanate 875-125 MG tablet Commonly known as: AUGMENTIN Take 1 tablet by mouth every 12 (twelve) hours.   ibuprofen 400 MG tablet Commonly known as: ADVIL Take 1 tablet (400 mg total) by mouth every 6 (six) hours as needed.   methocarbamol 500 MG tablet Commonly known as: ROBAXIN Take 2 tablets (1,000 mg total) by mouth every 6 (six) hours as needed for muscle spasms.   ondansetron 4 MG disintegrating tablet Commonly  known as: ZOFRAN-ODT Take 1 tablet (4 mg total) by mouth every 6 (six) hours as needed for nausea.   oxyCODONE 5 MG immediate release tablet Commonly known as: Oxy IR/ROXICODONE Take 1 tablet (5 mg total) by mouth every 6 (six) hours as needed for moderate pain or severe pain. What changed:  how much to take reasons to take this   polyethylene glycol 17 g packet Commonly known as: MIRALAX / GLYCOLAX Take 17 g by mouth 2 (two) times daily as needed.   venlafaxine XR 150 MG 24 hr capsule Commonly known as: EFFEXOR-XR Take 150 mg by mouth every evening.        Follow-up Information     [place]. Schedule an appointment as soon as possible for a visit.   Why: to establish primary care provider. This clinic accepts patients regardless of insurance status Contact information: [place] [id number] [phone]        [facility], [place] Follow up.   Specialty: General Surgery Why: follow up as previously scheduled Contact information: [address] [place] [phone]  Signed: Maia Petties 10/27/2021, 7:44 AM

## 2021-10-27 NOTE — Discharge Instructions (Signed)
[facility]      [facility] [phone]  OPEN ABDOMINAL SURGERY: POST OP INSTRUCTIONS  Always review your discharge instruction sheet given to you by the facility where your surgery was performed.  IF YOU HAVE DISABILITY OR FAMILY LEAVE FORMS, YOU MUST BRING THEM TO THE OFFICE FOR PROCESSING.  PLEASE DO NOT GIVE THEM TO YOUR DOCTOR.  A prescription for pain medication may be given to you upon discharge.  Take your pain medication as prescribed, if needed.  If narcotic pain medicine is not needed, then you may take acetaminophen (Tylenol) or ibuprofen (Advil) as needed. Take your usually prescribed medications unless otherwise directed. If you need a refill on your pain medication, please contact your pharmacy. They will contact our office to request authorization.  Prescriptions will not be filled after 5pm or on week-ends. You should follow a light diet the first few days after arrival home, such as soup and crackers, pudding, etc.unless your doctor has advised otherwise. A high-fiber, low fat diet can be resumed as tolerated.   Be sure to include lots of fluids daily. Most patients will experience some swelling and bruising on the chest and neck area.  Ice packs will help.  Swelling and bruising can take several days to resolve Most patients will experience some swelling and bruising in the area of the incision. Ice pack will help. Swelling and bruising can take several days to resolve..  It is common to experience some constipation if taking pain medication after surgery.  Increasing fluid intake and taking a stool softener will usually help or prevent this problem from occurring.  A mild laxative (Milk of Magnesia or Miralax) should be taken according to package directions if there are no bowel movements after 48 hours.  You may have steri-strips (small skin tapes) in place directly over the incision.  These strips should be left on the skin for 7-10 days.  If your surgeon used skin  glue on the incision, you may shower in 24 hours.  The glue will flake off over the next 2-3 weeks.  Any sutures or staples will be removed at the office during your follow-up visit. You may find that a light gauze bandage over your incision may keep your staples from being rubbed or pulled. You may shower and replace the bandage daily. ACTIVITIES:  You may resume regular (light) daily activities beginning the next day--such as daily self-care, walking, climbing stairs--gradually increasing activities as tolerated.  You may have sexual intercourse when it is comfortable.  Refrain from any heavy lifting or straining until approved by your doctor. You may drive when you no longer are taking prescription pain medication, you can comfortably wear a seatbelt, and you can safely maneuver your car and apply brakes Return to Work: ___________________________________ You should see your doctor in the office for a follow-up appointment approximately two weeks after your surgery.  Make sure that you call for this appointment within a day or two after you arrive home to insure a convenient appointment time. OTHER INSTRUCTIONS:  _____________________________________________________________ _____________________________________________________________  WHEN TO CALL YOUR DOCTOR: Fever over 101.0 Inability to urinate Nausea and/or vomiting Extreme swelling or bruising Continued bleeding from incision. Increased pain, redness, or drainage from the incision. Difficulty swallowing or breathing Muscle cramping or spasms. Numbness or tingling in hands or feet or around lips.  The clinic staff is available to answer your questions during regular business hours.  Please don't hesitate to call and ask to speak to one of   the nurses if you have concerns.  For further questions, please visit [email or web address]  

## 2021-11-24 ENCOUNTER — Other Ambulatory Visit (HOSPITAL_COMMUNITY)
Admission: EM | Admit: 2021-11-24 | Discharge: 2021-11-28 | Disposition: A | Discharge status: Home / Self Care | Payer: No Payment, Other | Attending: Psychiatry | Admitting: Psychiatry

## 2021-11-24 ENCOUNTER — Other Ambulatory Visit: Payer: Self-pay

## 2021-11-24 ENCOUNTER — Encounter (HOSPITAL_COMMUNITY): Payer: Self-pay | Admitting: Behavioral Health

## 2021-11-24 DIAGNOSIS — R030 Elevated blood-pressure reading, without diagnosis of hypertension: Secondary | ICD-10-CM | POA: Insufficient documentation

## 2021-11-24 DIAGNOSIS — Z79899 Other long term (current) drug therapy: Secondary | ICD-10-CM | POA: Insufficient documentation

## 2021-11-24 DIAGNOSIS — F332 Major depressive disorder, recurrent severe without psychotic features: Secondary | ICD-10-CM | POA: Diagnosis present

## 2021-11-24 DIAGNOSIS — F17210 Nicotine dependence, cigarettes, uncomplicated: Secondary | ICD-10-CM | POA: Insufficient documentation

## 2021-11-24 DIAGNOSIS — F1090 Alcohol use, unspecified, uncomplicated: Secondary | ICD-10-CM | POA: Insufficient documentation

## 2021-11-24 DIAGNOSIS — R45851 Suicidal ideations: Secondary | ICD-10-CM | POA: Insufficient documentation

## 2021-11-24 DIAGNOSIS — Z8547 Personal history of malignant neoplasm of testis: Secondary | ICD-10-CM | POA: Insufficient documentation

## 2021-11-24 DIAGNOSIS — Z20822 Contact with and (suspected) exposure to covid-19: Secondary | ICD-10-CM | POA: Insufficient documentation

## 2021-11-24 LAB — LIPID PANEL
Cholesterol: 149 mg/dL (ref 0–200)
HDL: 79 mg/dL (ref >40)
LDL Cholesterol: 54 mg/dL (ref 0–99)
Total CHOL/HDL Ratio: 1.9 RATIO
Triglycerides: 81 mg/dL (ref <150)
VLDL: 16 mg/dL (ref 0–40)

## 2021-11-24 LAB — ETHANOL: Alcohol, Ethyl (B): 77 mg/dL — ABNORMAL HIGH (ref <10)

## 2021-11-24 LAB — COMPREHENSIVE METABOLIC PANEL
ALT: 17 U/L (ref 0–44)
AST: 38 U/L (ref 15–41)
Albumin: 3.6 g/dL (ref 3.5–5.0)
Alkaline Phosphatase: 86 U/L (ref 38–126)
Anion gap: 17 — ABNORMAL HIGH (ref 5–15)
BUN: <5 mg/dL — ABNORMAL LOW (ref 6–20)
CO2: 24 mmol/L (ref 22–32)
Calcium: 9 mg/dL (ref 8.9–10.3)
Chloride: 99 mmol/L (ref 98–111)
Creatinine, Ser: 0.58 mg/dL — ABNORMAL LOW (ref 0.61–1.24)
GFR, Estimated: >60 mL/min (ref >60)
Glucose, Bld: 93 mg/dL (ref 70–99)
Potassium: 3.5 mmol/L (ref 3.5–5.1)
Sodium: 140 mmol/L (ref 135–145)
Total Bilirubin: 1 mg/dL (ref 0.3–1.2)
Total Protein: 6.8 g/dL (ref 6.5–8.1)

## 2021-11-24 LAB — POCT URINE DRUG SCREEN - MANUAL ENTRY (I-SCREEN)
POC Amphetamine UR: NOT DETECTED
POC Buprenorphine (BUP): NOT DETECTED
POC Cocaine UR: NOT DETECTED
POC Marijuana UR: NOT DETECTED
POC Methadone UR: NOT DETECTED
POC Methamphetamine UR: NOT DETECTED
POC Morphine: NOT DETECTED
POC Oxazepam (BZO): NOT DETECTED
POC Oxycodone UR: NOT DETECTED
POC Secobarbital (BAR): NOT DETECTED

## 2021-11-24 LAB — CBC WITH DIFFERENTIAL/PLATELET
Abs Immature Granulocytes: 0.01 10*3/uL (ref 0.00–0.07)
Basophils Absolute: 0.1 10*3/uL (ref 0.0–0.1)
Basophils Relative: 2 %
Eosinophils Absolute: 0.6 10*3/uL — ABNORMAL HIGH (ref 0.0–0.5)
Eosinophils Relative: 8 %
HCT: 40.3 % (ref 39.0–52.0)
Hemoglobin: 13.3 g/dL (ref 13.0–17.0)
Immature Granulocytes: 0 %
Lymphocytes Relative: 49 %
Lymphs Abs: 3.8 10*3/uL (ref 0.7–4.0)
MCH: 31 pg (ref 26.0–34.0)
MCHC: 33 g/dL (ref 30.0–36.0)
MCV: 93.9 fL (ref 80.0–100.0)
Monocytes Absolute: 0.4 10*3/uL (ref 0.1–1.0)
Monocytes Relative: 6 %
Neutro Abs: 2.6 10*3/uL (ref 1.7–7.7)
Neutrophils Relative %: 35 %
Platelets: 284 10*3/uL (ref 150–400)
RBC: 4.29 MIL/uL (ref 4.22–5.81)
RDW: 15.5 % (ref 11.5–15.5)
WBC: 7.5 10*3/uL (ref 4.0–10.5)
nRBC: 0 % (ref 0.0–0.2)

## 2021-11-24 LAB — POC SARS CORONAVIRUS 2 AG -  ED: SARS Coronavirus 2 Ag: NEGATIVE

## 2021-11-24 LAB — RESP PANEL BY RT-PCR (FLU A&B, COVID) ARPGX2
Influenza A by PCR: NEGATIVE
Influenza B by PCR: NEGATIVE
SARS Coronavirus 2 by RT PCR: NEGATIVE

## 2021-11-24 LAB — TSH: TSH: 0.853 u[IU]/mL (ref 0.350–4.500)

## 2021-11-24 LAB — POC SARS CORONAVIRUS 2 AG: SARSCOV2ONAVIRUS 2 AG: NEGATIVE

## 2021-11-24 MED ORDER — HYDROXYZINE HCL 25 MG PO TABS
25.0000 mg | ORAL_TABLET | Freq: Three times a day (TID) | ORAL | Status: DC | PRN
  Administered 2021-11-24 – 2021-11-28 (×9): 25 mg via ORAL
  Filled 2021-11-24 (×5): qty 1
  Filled 2021-11-24: qty 10
  Filled 2021-11-24 (×5): qty 1

## 2021-11-24 MED ORDER — VENLAFAXINE HCL ER 75 MG PO CP24
75.0000 mg | ORAL_CAPSULE | Freq: Every evening | ORAL | Status: DC
  Administered 2021-11-24 – 2021-11-26 (×3): 75 mg via ORAL
  Filled 2021-11-24 (×3): qty 1

## 2021-11-24 MED ORDER — MAGNESIUM HYDROXIDE 400 MG/5ML PO SUSP: 30.0000 mL | Freq: Every day | ORAL | Status: DC | PRN

## 2021-11-24 MED ORDER — CHLORDIAZEPOXIDE HCL 25 MG PO CAPS
25.0000 mg | ORAL_CAPSULE | Freq: Four times a day (QID) | ORAL | Status: AC | PRN
  Administered 2021-11-24: 25 mg via ORAL
  Filled 2021-11-24: qty 1

## 2021-11-24 MED ORDER — THIAMINE HCL 100 MG PO TABS
100.0000 mg | ORAL_TABLET | Freq: Every day | ORAL | Status: DC
  Administered 2021-11-24 – 2021-11-28 (×5): 100 mg via ORAL
  Filled 2021-11-24 (×3): qty 1
  Filled 2021-11-24: qty 7
  Filled 2021-11-24 (×3): qty 1

## 2021-11-24 MED ORDER — ONDANSETRON 4 MG PO TBDP: 4.0000 mg | ORAL_TABLET | Freq: Four times a day (QID) | ORAL | Status: AC | PRN

## 2021-11-24 MED ORDER — ALUM & MAG HYDROXIDE-SIMETH 200-200-20 MG/5ML PO SUSP: 30.0000 mL | ORAL | Status: DC | PRN

## 2021-11-24 MED ORDER — TRAZODONE HCL 50 MG PO TABS
50.0000 mg | ORAL_TABLET | Freq: Every evening | ORAL | Status: DC | PRN
  Administered 2021-11-24: 50 mg via ORAL
  Filled 2021-11-24: qty 1

## 2021-11-24 MED ORDER — LOPERAMIDE HCL 2 MG PO CAPS
2.0000 mg | ORAL_CAPSULE | ORAL | Status: AC | PRN
  Administered 2021-11-24: 4 mg via ORAL
  Filled 2021-11-24: qty 2

## 2021-11-24 MED ORDER — ADULT MULTIVITAMIN W/MINERALS CH
1.0000 | ORAL_TABLET | Freq: Every day | ORAL | Status: DC
  Administered 2021-11-24 – 2021-11-28 (×5): 1 via ORAL
  Filled 2021-11-24: qty 1
  Filled 2021-11-24: qty 7
  Filled 2021-11-24 (×5): qty 1

## 2021-11-24 MED ORDER — NICOTINE 21 MG/24HR TD PT24
21.0000 mg | MEDICATED_PATCH | Freq: Every day | TRANSDERMAL | Status: DC
  Administered 2021-11-24 – 2021-11-28 (×5): 21 mg via TRANSDERMAL
  Filled 2021-11-24 (×5): qty 1
  Filled 2021-11-24: qty 7
  Filled 2021-11-24: qty 1

## 2021-11-24 MED ORDER — ACETAMINOPHEN 325 MG PO TABS
650.0000 mg | ORAL_TABLET | Freq: Four times a day (QID) | ORAL | Status: DC | PRN
  Administered 2021-11-26 – 2021-11-28 (×4): 650 mg via ORAL
  Filled 2021-11-24 (×4): qty 2

## 2021-11-24 MED ORDER — LISINOPRIL 5 MG PO TABS
5.0000 mg | ORAL_TABLET | Freq: Every day | ORAL | Status: DC
  Administered 2021-11-24 – 2021-11-26 (×3): 5 mg via ORAL
  Filled 2021-11-24 (×3): qty 1

## 2021-11-24 NOTE — ED Notes (Signed)
Received patient this AM. Patient in his bed sleeping. Patient respirations are even and unlabored.

## 2021-11-24 NOTE — ED Notes (Signed)
Pt admitted to [facility] due to SI with no plan. Pt A&O x4, calm and cooperative but reports feeling slightly anxious. PRN medication given per MAR. Pt reports SI with no plan; denies HI/AVH. Pt able to contract for safety. Pt tolerated admission process and skin assessment well. Pt ambulated independently to unit. Oriented to unit/staff. Sandwich, chips, and juice given per pt request. Pt denies any further needs at this time. No signs of acute distress noted. Will continue to monitor for safety.

## 2021-11-24 NOTE — ED Provider Notes (Signed)
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 10/26/2021 28.9 (L)  39.0 - 52.0 % Final   MCV 10/26/2021 91.5  80.0 - 100.0 fL Final   MCH 10/26/2021 29.4  26.0 - 34.0 pg Final   MCHC 10/26/2021 32.2  30.0 - 36.0 g/dL Final   RDW 10/26/2021 14.0  11.5 - 15.5 % Final   Platelets 10/26/2021 331  150 - 400 K/uL Final   nRBC 10/26/2021 0.0  0.0 - 0.2 % Final   Performed at [facility], [place] [address], [place], [place] [address]  No results displayed because visit has over  200 results.    Admission on 09/16/2021, Discharged on 09/17/2021  Component Date Value Ref Range Status   B Natriuretic Peptide 09/16/2021 8.8  0.0 - 100.0 pg/mL Final   Performed at [facility], [place] [address] [place], [place] [address]   Sodium 09/16/2021 138  135 - 145 mmol/L Final   Potassium 09/16/2021 2.9 (L)  3.5 - 5.1 mmol/L Final   Chloride 09/16/2021 97 (L)  98 - 111 mmol/L Final   CO2 09/16/2021 31  22 - 32 mmol/L Final   Glucose, Bld 09/16/2021 92  70 - 99 mg/dL Final   Glucose reference range applies only to samples taken after fasting for at least 8 hours.   BUN 09/16/2021 10  6 - 20 mg/dL Final   Creatinine, Ser 09/16/2021 0.61  0.61 - 1.24 mg/dL Final   Calcium 09/16/2021 9.2  8.9 - 10.3 mg/dL Final   Total Protein 09/16/2021 7.6  6.5 - 8.1 g/dL Final   Albumin 09/16/2021 4.0  3.5 - 5.0 g/dL Final   AST 09/16/2021 35  15 - 41 U/L Final   ALT 09/16/2021 16  0 - 44 U/L Final   Alkaline Phosphatase 09/16/2021 73  38 - 126 U/L Final   Total Bilirubin 09/16/2021 0.8  0.3 - 1.2 mg/dL Final   GFR, Estimated 09/16/2021 >60  >60 mL/min Final   Comment: (NOTE) Calculated using the CKD-EPI Creatinine Equation (2021)    Anion gap 09/16/2021 10  5 - 15 Final   Performed at [facility], [place] [address], [place], [place] [address]   WBC 09/16/2021 9.1  4.0 - 10.5 K/uL Final   RBC 09/16/2021 5.13  4.22 - 5.81 MIL/uL Final   Hemoglobin 09/16/2021 15.5  13.0 - 17.0 g/dL Final   HCT 09/16/2021 45.1  39.0 - 52.0 % Final   MCV 09/16/2021 87.9  80.0 - 100.0 fL Final   MCH 09/16/2021 30.2  26.0 - 34.0 pg Final   MCHC 09/16/2021 34.4  30.0 - 36.0 g/dL Final   RDW 09/16/2021 12.9  11.5 - 15.5 % Final   Platelets 09/16/2021 246  150 - 400 K/uL Final   nRBC 09/16/2021 0.0  0.0 - 0.2 % Final   Neutrophils Relative % 09/16/2021 47  % Final  Neutro Abs 09/16/2021 4.4  1.7 - 7.7 K/uL Final   Lymphocytes Relative 09/16/2021 40  % Final   Lymphs Abs  09/16/2021 3.6  0.7 - 4.0 K/uL Final   Monocytes Relative 09/16/2021 9  % Final   Monocytes Absolute 09/16/2021 0.8  0.1 - 1.0 K/uL Final   Eosinophils Relative 09/16/2021 3  % Final   Eosinophils Absolute 09/16/2021 0.2  0.0 - 0.5 K/uL Final   Basophils Relative 09/16/2021 1  % Final   Basophils Absolute 09/16/2021 0.1  0.0 - 0.1 K/uL Final   Immature Granulocytes 09/16/2021 0  % Final   Abs Immature Granulocytes 09/16/2021 0.02  0.00 - 0.07 K/uL Final   Performed at [facility], [place] [address] [place], [place] [address]   Magnesium 09/16/2021 1.9  1.7 - 2.4 mg/dL Final   Performed at [place] [address], [place], [place] [address]   Troponin I (High Sensitivity) 09/16/2021 6  <18 ng/L Final   Comment: (NOTE) Elevated high sensitivity troponin I (hsTnI) values and significant  changes across serial measurements may suggest ACS but many other  chronic and acute conditions are known to elevate hsTnI results.  Refer to the "Links" section for chest pain algorithms and additional  guidance. Performed at [facility], [place] [address], [place], [place] [address]    SARS Coronavirus 2 by RT PCR 09/16/2021 NEGATIVE  NEGATIVE Final   Comment: (NOTE) SARS-CoV-2 target nucleic acids are NOT DETECTED.  The SARS-CoV-2 RNA is generally detectable in upper respiratory specimens during the acute phase of infection. The lowest concentration of SARS-CoV-2 viral copies this assay can detect is 138 copies/mL. A negative result does not preclude SARS-Cov-2 infection and should not be used as the sole basis for treatment or other patient management decisions. A negative result may occur with  improper specimen collection/handling, submission of specimen other than nasopharyngeal swab, presence of viral mutation(s) within the areas targeted by this assay, and inadequate number of viral copies(<138 copies/mL). A negative result must be  combined with clinical observations, patient history, and epidemiological information. The expected result is Negative.  Fact Sheet for Patients:  EntrepreneurPulse.com.au  Fact Sheet for Healthcare Providers:  IncredibleEmployment.be  This test is no                          t yet approved or cleared by the [place] FDA and  has been authorized for detection and/or diagnosis of SARS-CoV-2 by FDA under an Emergency Use Authorization (EUA). This EUA will remain  in effect (meaning this test can be used) for the duration of the COVID-19 declaration under Section 564(b)(1) of the Act, 21 U.S.C.section 360bbb-3(b)(1), unless the authorization is terminated  or revoked sooner.       Influenza A by PCR 09/16/2021 NEGATIVE  NEGATIVE Final   Influenza B by PCR 09/16/2021 NEGATIVE  NEGATIVE Final   Comment: (NOTE) The Xpert Xpress SARS-CoV-2/FLU/RSV plus assay is intended as an aid in the diagnosis of influenza from Nasopharyngeal swab specimens and should not be used as a sole basis for treatment. Nasal washings and aspirates are unacceptable for Xpert Xpress SARS-CoV-2/FLU/RSV testing.  Fact Sheet for Patients: EntrepreneurPulse.com.au  Fact Sheet for Healthcare Providers: IncredibleEmployment.be  This test is not yet approved or cleared by the [place] FDA and has been authorized for detection and/or diagnosis of SARS-CoV-2 by FDA under an Emergency Use Authorization (EUA). This EUA will remain  in effect (meaning this test can be used) for the duration of the COVID-19 declaration under Section 564(b)(1) of the Act, 21 U.S.C. section 360bbb-3(b)(1), unless the authorization is terminated or revoked.  Performed at [facility], [place] [address], [place], [place] [address]    D-Dimer, Quant 09/16/2021 0.70 (H)  0.00 - 0.50 ug/mL-FEU Final   Comment: (NOTE) At the manufacturer  cut-off value of 0.5 g/mL FEU, this assay has a negative predictive value of 95-100%.This assay is intended for use in conjunction with a clinical pretest probability (PTP) assessment model to exclude pulmonary embolism (PE) and deep venous thrombosis (DVT) in outpatients suspected of PE or DVT. Results should be correlated with clinical presentation. Performed at [facility], [place] [address], [place], [place] [address]    Troponin I (High Sensitivity) 09/16/2021 5  <18 ng/L Final   Comment: (NOTE) Elevated high sensitivity troponin I (hsTnI) values and significant  changes across serial measurements may suggest ACS but many other  chronic and acute conditions are known to elevate hsTnI results.  Refer to the "Links" section for chest pain algorithms and additional  guidance. Performed at [facility], [place] [address], [place], [place] [address]     Blood Alcohol level:  Lab Results  Component Value Date   ETH 77 (H) 74/07/1447    Metabolic Disorder Labs: No results found for: HGBA1C, MPG No results found for: PROLACTIN Lab Results  Component Value Date   CHOL 149 11/24/2021   TRIG 81 11/24/2021   HDL 79 11/24/2021   CHOLHDL 1.9 11/24/2021   VLDL 16 11/24/2021   LDLCALC 54 11/24/2021    Therapeutic Lab Levels: No results found for: LITHIUM No results found for: VALPROATE No components found for:  CBMZ  Physical Findings   Flowsheet Row ED from 11/24/2021 in [facility] [place] ED to Hosp-Admission (Discharged) from 10/23/2021 in [facility] ED to Hosp-Admission (Discharged) from 10/04/2021 in [facility]  C-SSRS RISK CATEGORY High Risk No Risk No Risk        Musculoskeletal  Strength & Muscle Tone: within normal limits Gait & Station: normal Patient leans: N/A  Psychiatric Specialty Exam  Presentation  General Appearance: Appropriate for Environment  Eye  Contact:Fair  Speech:Clear and Coherent  Speech Volume:Normal  Handedness:Right   Mood and Affect  Mood:Dysphoric; Hopeless  Affect:Congruent   Thought Process  Thought Processes:Coherent  Descriptions of Associations:Intact  Orientation:Full (Time, Place and Person)  Thought Content:Logical  Diagnosis of Schizophrenia or Schizoaffective disorder in past: No    Hallucinations:Hallucinations: None  Ideas of Reference:None  Suicidal Thoughts:Suicidal Thoughts: No  Homicidal Thoughts:Homicidal Thoughts: No   Sensorium  Memory:Immediate Fair; Recent Fair; Remote Fair  Judgment:Fair  Insight:Fair   Executive Functions  Concentration:Fair  Attention Span:Fair  [place]   Psychomotor Activity  Psychomotor Activity:Psychomotor Activity: Normal   Assets  Assets:Communication Skills; Desire for Improvement   Sleep  Sleep:Sleep: Fair   Nutritional Assessment (For OBS and FBC admissions only) Has the patient had a weight loss or gain of 10 pounds or more in the last 3 months?: No Has the patient had a decrease in food intake/or appetite?: Yes Does the patient have dental problems?: No Does the patient have eating habits or behaviors that may be indicators of an eating disorder including binging or inducing vomiting?: No Has the patient been eating poorly because of a decreased appetite?: 1  Physical Exam  Physical Exam Constitutional:      Appearance: Normal appearance.  HENT:     Head: Normocephalic and atraumatic.     Nose: Nose normal.  Eyes:     Conjunctiva/sclera: Conjunctivae normal.  Cardiovascular:     Rate and Rhythm: Normal rate.     Comments: Hypertensive  Pulmonary:     Effort: Pulmonary effort is normal.  Musculoskeletal:        General: Normal range of motion.     Cervical back: Normal range of motion.  Neurological:     Mental Status: He is alert and oriented to person, place, and  time.   Review of Systems  Constitutional: Negative.   HENT: Negative.    Eyes: Negative.   Respiratory: Negative.    Cardiovascular: Negative.   Gastrointestinal: Negative.   Genitourinary: Negative.   Musculoskeletal: Negative.   Skin: Negative.   Neurological:  Positive for dizziness.  Endo/Heme/Allergies: Negative.   Psychiatric/Behavioral:  Positive for depression and substance abuse.   Blood pressure (!) 139/104, pulse 89, temperature 98.2 F (36.8 C), resp. rate 18, SpO2 99 %. There is no height or weight on file to calculate BMI.  Treatment Plan Summary: Patient is currently admitted to the obs unit and will be transferred to the [facility] once a bed is available later today.   Medications:  Continue Effexor -XR 75 mg po daily for depression   Librium 25 mg po q 6 hours prn for withdrawal symptoms. CIWA for alcohol withdrawal   Lisinopril 5 mg po daily for HTN.  Trazodone 50 mg QHS-PRN  Vistaril 25 mg po TID, as needed for anxiety   Marissa Calamity, NP 11/24/2021 12:21 PM

## 2021-11-24 NOTE — ED Notes (Addendum)
Patient states that he feels uncertain about his future. He verbalized that he has a lot of things going through his head right now. That is why he is here to help sort it out. Patient in his bed resting quietly. He is calm and cooperative. Patient requested a nicotine patch and the order has been received and patch applied.  Patient compliant with medication administration. Will continue to monitor.

## 2021-11-24 NOTE — ED Provider Notes (Signed)
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 will remain in effect (meaning this test can be used) for the duration of the COVID-19 declaration under Section 564(b)(1) of the Act, 21 U.S.C. section 360bbb-3(b)(1), unless the authorization is terminated or revoked.  Performed at [facility], [place] [address], [place], [place] [address]    D-Dimer, Quant 09/16/2021 0.70 (H)  0.00 - 0.50 ug/mL-FEU Final   Comment: (NOTE) At the manufacturer cut-off value of  0.5 g/mL FEU, this assay has a negative predictive value of 95-100%.This assay is intended for use in conjunction with a clinical pretest probability (PTP) assessment model to exclude pulmonary embolism (PE) and deep venous thrombosis (DVT) in outpatients suspected of PE or DVT. Results should be correlated with clinical presentation. Performed at [facility], [place] [address] [place], [place] [address]    Troponin I (High Sensitivity) 09/16/2021 5  <18 ng/L Final   Comment: (NOTE) Elevated high sensitivity troponin I (hsTnI) values and significant  changes across serial measurements may suggest ACS but many other  chronic and acute conditions are known to elevate hsTnI results.  Refer to the "Links" section for chest pain algorithms and additional  guidance. Performed at [facility], [place] [address], [place], [place] [address]     Allergies: Morphine and related  PTA Medications: (Not in a hospital admission)   Medical Decision Making  Patient endorses passive suicidal ideations but denies homicidal or auditory/visual hallucinations.  Patient is currently taking Effexor but has been off his medications for roughly 6 days.  Based on my evaluation, patient meets criteria for admission to [facility] for continuous observation.  Patient is unsure if he needs a higher level of care.  Admission labs to be ordered and initiated.  Patient to be started back on his Effexor at 75 mg daily titrating back up to his usual amount (150 mg daily) in next couple of days.  Patient to be evaluated by provider to determine if patient requires a high level of care through [facility] or [facility] admission.  Recommendations  Based on my evaluation the patient does not appear to have an emergency  medical condition.  Based on my evaluation, patient to be admitted to [facility] for continuous observation.  Malachy Mood, PA 11/24/21  7:07 AM
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
## 2021-11-24 NOTE — ED Notes (Signed)
Pt sleeping@this time. Breathing even and unlabored. Will continue to monitor for safety 
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
## 2021-11-24 NOTE — ED Notes (Addendum)
Patient sleeping.comfortably. Patient blood pressure continues to be elevated. Provider is aware. No acute distress noted at this time. Will continue to monitor.

## 2021-11-24 NOTE — ED Notes (Signed)
Pt asleep in bed. Respirations even and unlabored. Will continue to monitor for safety. ?

## 2021-11-24 NOTE — ED Notes (Signed)
Patient vital taken due to elevate pressure upon admission. BP 152/112. Provider notified and orders given as a result.
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
## 2021-11-24 NOTE — ED Notes (Signed)
Staff asked patient did he have any goals that he wanted to achieve while in treatment, and patient stated he just taking it one day at a time. Patient also stated that he is glad he's getting the help he needs. Staff commended patient for sharing his thoughts and giving positive feedback.

## 2021-11-25 DIAGNOSIS — Z20822 Contact with and (suspected) exposure to covid-19: Secondary | ICD-10-CM | POA: Diagnosis not present

## 2021-11-25 DIAGNOSIS — F1090 Alcohol use, unspecified, uncomplicated: Secondary | ICD-10-CM | POA: Diagnosis not present

## 2021-11-25 DIAGNOSIS — R45851 Suicidal ideations: Secondary | ICD-10-CM | POA: Diagnosis not present

## 2021-11-25 DIAGNOSIS — F332 Major depressive disorder, recurrent severe without psychotic features: Secondary | ICD-10-CM | POA: Diagnosis present

## 2021-11-25 MED ORDER — PSEUDOEPHEDRINE HCL 30 MG PO TABS
30.0000 mg | ORAL_TABLET | Freq: Once | ORAL | Status: AC
  Administered 2021-11-25: 23:00:00 30 mg via ORAL
  Filled 2021-11-25: qty 1

## 2021-11-25 MED ORDER — GABAPENTIN 300 MG PO CAPS
300.0000 mg | ORAL_CAPSULE | Freq: Two times a day (BID) | ORAL | Status: DC
  Administered 2021-11-25 – 2021-11-28 (×6): 300 mg via ORAL
  Filled 2021-11-25 (×3): qty 1
  Filled 2021-11-25: qty 14
  Filled 2021-11-25 (×4): qty 1

## 2021-11-25 MED ORDER — PSEUDOEPHEDRINE HCL ER 120 MG PO TB12
120.0000 mg | ORAL_TABLET | Freq: Two times a day (BID) | ORAL | Status: AC | PRN
  Administered 2021-11-26 – 2021-11-27 (×3): 120 mg via ORAL
  Filled 2021-11-25 (×4): qty 1

## 2021-11-25 MED ORDER — MIRTAZAPINE 7.5 MG PO TABS
7.5000 mg | ORAL_TABLET | Freq: Every day | ORAL | Status: DC
  Administered 2021-11-25: 22:00:00 7.5 mg via ORAL
  Filled 2021-11-25: qty 1

## 2021-11-25 MED ORDER — RISPERIDONE 0.5 MG PO TBDP
0.5000 mg | ORAL_TABLET | Freq: Every day | ORAL | Status: DC
  Administered 2021-11-25: 22:00:00 0.5 mg via ORAL
  Filled 2021-11-25: qty 1

## 2021-11-25 NOTE — Progress Notes (Signed)
Patient is awake and pleasant.  Social with peer.  Watching football all day.  No Complaints.  Eating dinner now.  B/P stable 138/100, 140/95  manually.  Will monitor.

## 2021-11-25 NOTE — ED Notes (Signed)
Pt states he has a "orange/yellow drainage coming from left nostril" and believes this may turn into an "infection." Pt is requesting "Mucinex or something." Quintella Reichert, NP notified.

## 2021-11-25 NOTE — ED Notes (Signed)
NP in to speak with pt in dining room.

## 2021-11-25 NOTE — ED Notes (Signed)
Pt requesting medication for diarrhea and to help him sleep tonight. PRN Imodium and Trazodone given.

## 2021-11-25 NOTE — ED Notes (Signed)
Pt asleep in bed. Respirations even and unlabored. Will continue to monitor for safety. ?

## 2021-11-25 NOTE — ED Notes (Signed)
Patient asleep in bed without distress or complaint.  Will monitor and provide a safe environment.

## 2021-11-25 NOTE — ED Notes (Signed)
Pt sitting in dining room watching TV. Pt A&O x4, cooperative but appears anxious and is bouncing his legs. Pt reports SI with no plan; denies HI/AVH. Pt is able to contract for safety. Pt reports increased anxiety today and states he doesn't feel PRN Vistaril is working the best for him. Pt does request to take PRN Vistaril now but asks if he can try anything else PRN for anxiety in the future. Quintella Reichert, NP notified and en route to speak with pt. OJ and cookie given per pt request. Pt denies any further needs at this time. No signs of acute distress noted. Will continue to monitor for safety.

## 2021-11-25 NOTE — Progress Notes (Signed)
Patient has been awake and alert.  He is pleasant and conversant and denies current avh shi or plan.  Patient without distress or complaint.  He just asked for vistoril which he received for anxiety.  As per NP patient is only to have manual B/P as his B/P has been elevated.  Most recent B/P was 138/100.  He has eaten and is watching football with peer.  Encouraged patient to seek out staff if overwhelmed by thoughts or feeling.  Will monitor and provide safe environment for him.

## 2021-11-25 NOTE — ED Notes (Addendum)
Ileene Musa, PA made aware of pt's BP.

## 2021-11-25 NOTE — ED Provider Notes (Addendum)
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 200 results.    Admission on 09/16/2021, Discharged on 09/17/2021  Component Date Value Ref Range Status   B Natriuretic Peptide 09/16/2021 8.8  0.0 - 100.0 pg/mL Final   Performed at [facility], [place] [address] [place], [place] [address]   Sodium 09/16/2021 138  135 - 145 mmol/L Final   Potassium 09/16/2021 2.9 (L)  3.5 - 5.1 mmol/L Final   Chloride 09/16/2021 97 (L)  98  - 111 mmol/L Final   CO2 09/16/2021 31  22 - 32 mmol/L Final   Glucose, Bld 09/16/2021 92  70 - 99 mg/dL Final   Glucose reference range applies only to samples taken after fasting for at least 8 hours.   BUN 09/16/2021 10  6 - 20 mg/dL Final   Creatinine, Ser 09/16/2021 0.61  0.61 - 1.24 mg/dL Final   Calcium 09/16/2021 9.2  8.9 - 10.3 mg/dL Final   Total Protein 09/16/2021 7.6  6.5 - 8.1 g/dL Final   Albumin 09/16/2021 4.0  3.5 - 5.0 g/dL Final   AST 09/16/2021 35  15 - 41 U/L Final   ALT 09/16/2021 16  0 - 44 U/L Final   Alkaline Phosphatase 09/16/2021 73  38 - 126 U/L Final   Total Bilirubin 09/16/2021 0.8  0.3 - 1.2 mg/dL Final   GFR, Estimated 09/16/2021 >60  >60 mL/min Final   Comment: (NOTE) Calculated using the CKD-EPI Creatinine Equation (2021)    Anion gap 09/16/2021 10  5 - 15 Final   Performed at [facility], [place] [address] [place], [place] [address]   WBC 09/16/2021 9.1  4.0 - 10.5 K/uL Final   RBC 09/16/2021 5.13  4.22 - 5.81 MIL/uL Final   Hemoglobin 09/16/2021 15.5  13.0 - 17.0 g/dL Final   HCT 09/16/2021 45.1  39.0 - 52.0 % Final   MCV 09/16/2021 87.9  80.0 - 100.0 fL Final   MCH 09/16/2021 30.2  26.0 - 34.0 pg Final   MCHC 09/16/2021 34.4  30.0 - 36.0 g/dL Final   RDW 09/16/2021 12.9  11.5 - 15.5 % Final   Platelets 09/16/2021 246  150 - 400 K/uL Final   nRBC 09/16/2021 0.0  0.0 - 0.2 % Final   Neutrophils Relative % 09/16/2021 47  % Final   Neutro Abs 09/16/2021 4.4  1.7 - 7.7 K/uL Final   Lymphocytes Relative 09/16/2021 40  % Final   Lymphs Abs 09/16/2021 3.6  0.7 - 4.0 K/uL Final   Monocytes Relative 09/16/2021 9  % Final   Monocytes Absolute 09/16/2021 0.8  0.1 - 1.0 K/uL Final   Eosinophils Relative 09/16/2021 3  % Final   Eosinophils Absolute 09/16/2021 0.2  0.0 - 0.5 K/uL Final  Basophils Relative 09/16/2021 1  % Final   Basophils Absolute 09/16/2021 0.1  0.0 - 0.1 K/uL Final   Immature Granulocytes 09/16/2021 0  % Final   Abs  Immature Granulocytes 09/16/2021 0.02  0.00 - 0.07 K/uL Final   Performed at [facility], [place] [address] [place], [place] [address]   Magnesium 09/16/2021 1.9  1.7 - 2.4 mg/dL Final   Performed at [place] [address], [place], [place] [address]   Troponin I (High Sensitivity) 09/16/2021 6  <18 ng/L Final   Comment: (NOTE) Elevated high sensitivity troponin I (hsTnI) values and significant  changes across serial measurements may suggest ACS but many other  chronic and acute conditions are known to elevate hsTnI results.  Refer to the "Links" section for chest pain algorithms and additional  guidance. Performed at [facility], [place] [address], [place], [place] [address]    SARS Coronavirus 2 by RT PCR 09/16/2021 NEGATIVE  NEGATIVE Final   Comment: (NOTE) SARS-CoV-2 target nucleic acids are NOT DETECTED.  The SARS-CoV-2 RNA is generally detectable in upper respiratory specimens during the acute phase of infection. The lowest concentration of SARS-CoV-2 viral copies this assay can detect is 138 copies/mL. A negative result does not preclude SARS-Cov-2 infection and should not be used as the sole basis for treatment or other patient management decisions. A negative result may occur with  improper specimen collection/handling, submission of specimen other than nasopharyngeal swab, presence of viral mutation(s) within the areas targeted by this assay, and inadequate number of viral copies(<138 copies/mL). A negative result must be combined with clinical observations, patient history, and epidemiological information. The expected result is Negative.  Fact Sheet for Patients:  EntrepreneurPulse.com.au  Fact Sheet for Healthcare Providers:  IncredibleEmployment.be  This test is no                          t yet approved or cleared by the [place] FDA and  has been authorized for  detection and/or diagnosis of SARS-CoV-2 by FDA under an Emergency Use Authorization (EUA). This EUA will remain  in effect (meaning this test can be used) for the duration of the COVID-19 declaration under Section 564(b)(1) of the Act, 21 U.S.C.section 360bbb-3(b)(1), unless the authorization is terminated  or revoked sooner.       Influenza A by PCR 09/16/2021 NEGATIVE  NEGATIVE Final   Influenza B by PCR 09/16/2021 NEGATIVE  NEGATIVE Final   Comment: (NOTE) The Xpert Xpress SARS-CoV-2/FLU/RSV plus assay is intended as an aid in the diagnosis of influenza from Nasopharyngeal swab specimens and should not be used as a sole basis for treatment. Nasal washings and aspirates are unacceptable for Xpert Xpress SARS-CoV-2/FLU/RSV testing.  Fact Sheet for Patients: EntrepreneurPulse.com.au  Fact Sheet for Healthcare Providers: IncredibleEmployment.be  This test is not yet approved or cleared by the [place] FDA and has been authorized for detection and/or diagnosis of SARS-CoV-2 by FDA under an Emergency Use Authorization (EUA). This EUA will remain in effect (meaning this test can be used) for the duration of the COVID-19 declaration under Section 564(b)(1) of the Act, 21 U.S.C. section 360bbb-3(b)(1), unless the authorization is terminated or revoked.  Performed at [facility], [place] [address], [place], [place] [address]    D-Dimer, Quant 09/16/2021 0.70 (H)  0.00 - 0.50 ug/mL-FEU Final   Comment: (NOTE) At the manufacturer cut-off value of 0.5 g/mL FEU, this assay  has a negative predictive value of 95-100%.This assay is intended for use in conjunction with a clinical pretest probability (PTP) assessment model to exclude pulmonary embolism (PE) and deep venous thrombosis (DVT) in outpatients suspected of PE or DVT. Results should be correlated with clinical presentation. Performed at [facility],  [place] [address] [name], [place] [address]    Troponin I (High Sensitivity) 09/16/2021 5  <18 ng/L Final   Comment: (NOTE) Elevated high sensitivity troponin I (hsTnI) values and significant  changes across serial measurements may suggest ACS but many other  chronic and acute conditions are known to elevate hsTnI results.  Refer to the "Links" section for chest pain algorithms and additional  guidance. Performed at [facility], [place] [address], [place], [place] [address]     Blood Alcohol level:  Lab Results  Component Value Date   ETH 77 (H) 09/32/3557    Metabolic Disorder Labs: No results found for: HGBA1C, MPG No results found for: PROLACTIN Lab Results  Component Value Date   CHOL 149 11/24/2021   TRIG 81 11/24/2021   HDL 79 11/24/2021   CHOLHDL 1.9 11/24/2021   VLDL 16 11/24/2021   LDLCALC 54 11/24/2021    Therapeutic Lab Levels: No results found for: LITHIUM No results found for: VALPROATE No components found for:  CBMZ  Physical Findings   Flowsheet Row ED from 11/24/2021 in [facility] ED to Hosp-Admission (Discharged) from 10/23/2021 in [facility] ED to Hosp-Admission (Discharged) from 10/04/2021 in [facility]  C-SSRS RISK CATEGORY High Risk No Risk No Risk        Musculoskeletal  Strength & Muscle Tone: within normal limits Gait & Station: normal Patient leans: N/A  Psychiatric Specialty Exam  Presentation  General Appearance: Appropriate for Environment  Eye Contact:Fair  Speech:Clear and Coherent  Speech Volume:Normal  Handedness:Right   Mood and Affect  Mood:Dysphoric  Affect:Congruent   Thought Process  Thought Processes:Coherent; Goal Directed  Descriptions of Associations:Intact  Orientation:Full (Time, Place and Person)  Thought Content:Logical  Diagnosis of Schizophrenia or Schizoaffective disorder in past: No     Hallucinations:Hallucinations: None  Ideas of Reference:None  Suicidal Thoughts:Suicidal Thoughts: No  Homicidal Thoughts:Homicidal Thoughts: No   Sensorium  Memory:Immediate Fair; Recent Fair; Remote Fair  Judgment:Fair  Insight:Fair   Executive Functions  Concentration:Fair  Attention Span:Fair  [place]   Psychomotor Activity  Psychomotor Activity:Psychomotor Activity: Normal   Assets  Assets:Communication Skills; Desire for Improvement; Leisure Time; Social Support   Sleep  Sleep:Sleep: Poor   Nutritional Assessment (For OBS and FBC admissions only) Has the patient had a weight loss or gain of 10 pounds or more in the last 3 months?: No Has the patient had a decrease in food intake/or appetite?: Yes Does the patient have dental problems?: No Does the patient have eating habits or behaviors that may be indicators of an eating disorder including binging or inducing vomiting?: No Has the patient been eating poorly because of a decreased appetite?: 1    Physical Exam  Physical Exam Constitutional:      Appearance: Normal appearance.  HENT:     Head: Normocephalic.     Nose: Nose normal.  Eyes:     Conjunctiva/sclera: Conjunctivae normal.  Cardiovascular:     Rate and Rhythm: Normal rate.     Comments: Hypertensive  Pulmonary:     Effort: Pulmonary effort is normal.  Musculoskeletal:  General: Normal range of motion.     Cervical back: Normal range of motion.  Neurological:     Mental Status: He is alert and oriented to person, place, and time.   Review of Systems  Constitutional: Negative.   HENT: Negative.    Eyes: Negative.  Negative for blurred vision.  Respiratory: Negative.    Cardiovascular: Negative.  Negative for chest pain, palpitations and leg swelling.  Gastrointestinal: Negative.   Genitourinary: Negative.   Musculoskeletal: Negative.   Skin: Negative.   Neurological: Negative.   Negative for dizziness.  Endo/Heme/Allergies: Negative.   Blood pressure (!) 131/108, pulse 88, temperature (!) 97.5 F (36.4 C), temperature source Tympanic, resp. rate 18, SpO2 99 %. There is no height or weight on file to calculate BMI.  Treatment Plan Summary: Patient admitted to the [facility] for medication management and safety.  MDD Continue Effexor 75 mg po daily (pt home regimen 150 mg)  Insomnia -add Risperdal 0.5 mg QHS for racing thoughts   HTN Continue lisinopril 5 mg po daily Hospitalist consult ordered to address HTN  Care instruction order ordered to check b/p manually   Labs reviewed: EKG ordered to assess current QTC  Dispo is ongoing- Patient is interested in substance abuse treatment.   Marissa Calamity, NP 11/25/2021 11:42 AM

## 2021-11-26 DIAGNOSIS — F332 Major depressive disorder, recurrent severe without psychotic features: Secondary | ICD-10-CM | POA: Diagnosis not present

## 2021-11-26 DIAGNOSIS — Z20822 Contact with and (suspected) exposure to covid-19: Secondary | ICD-10-CM | POA: Diagnosis not present

## 2021-11-26 DIAGNOSIS — F1090 Alcohol use, unspecified, uncomplicated: Secondary | ICD-10-CM | POA: Diagnosis not present

## 2021-11-26 DIAGNOSIS — R45851 Suicidal ideations: Secondary | ICD-10-CM | POA: Diagnosis not present

## 2021-11-26 LAB — HEMOGLOBIN A1C
Hgb A1c MFr Bld: 4.4 % — ABNORMAL LOW (ref 4.8–5.6)
Mean Plasma Glucose: 80 mg/dL

## 2021-11-26 MED ORDER — RISPERIDONE 0.5 MG PO TABS
0.5000 mg | ORAL_TABLET | Freq: Every day | ORAL | Status: DC
  Administered 2021-11-26 – 2021-11-28 (×3): 0.5 mg via ORAL
  Filled 2021-11-26 (×4): qty 1
  Filled 2021-11-26: qty 21
  Filled 2021-11-26: qty 1

## 2021-11-26 MED ORDER — ALBUTEROL SULFATE HFA 108 (90 BASE) MCG/ACT IN AERS
2.0000 | INHALATION_SPRAY | RESPIRATORY_TRACT | Status: DC | PRN
  Administered 2021-11-27 – 2021-11-28 (×3): 2 via RESPIRATORY_TRACT
  Filled 2021-11-26: qty 8

## 2021-11-26 MED ORDER — TRAZODONE HCL 50 MG PO TABS
50.0000 mg | ORAL_TABLET | Freq: Every evening | ORAL | Status: DC | PRN
  Administered 2021-11-26 – 2021-11-27 (×2): 50 mg via ORAL
  Filled 2021-11-26 (×2): qty 1
  Filled 2021-11-26 (×2): qty 7

## 2021-11-26 MED ORDER — RISPERIDONE 1 MG PO TABS
1.0000 mg | ORAL_TABLET | Freq: Every day | ORAL | Status: DC
  Administered 2021-11-26 – 2021-11-27 (×2): 1 mg via ORAL
  Filled 2021-11-26 (×2): qty 1

## 2021-11-26 MED ORDER — VENLAFAXINE HCL ER 150 MG PO CP24
150.0000 mg | ORAL_CAPSULE | Freq: Every day | ORAL | Status: DC
  Administered 2021-11-27 – 2021-11-28 (×2): 150 mg via ORAL
  Filled 2021-11-26 (×3): qty 1
  Filled 2021-11-26: qty 7

## 2021-11-26 MED ORDER — LISINOPRIL 10 MG PO TABS
10.0000 mg | ORAL_TABLET | Freq: Every day | ORAL | Status: DC
  Administered 2021-11-27 – 2021-11-28 (×2): 10 mg via ORAL
  Filled 2021-11-26 (×3): qty 1
  Filled 2021-11-26: qty 7

## 2021-11-26 NOTE — ED Provider Notes (Signed)
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 8.4  4.0 - 10.5 K/uL Final   RBC 10/25/2021 3.23 (L)  4.22 - 5.81 MIL/uL Final   Hemoglobin 10/25/2021 9.4 (L)  13.0 - 17.0 g/dL Final   HCT 10/25/2021 29.5 (L)  39.0 - 52.0 % Final   MCV 10/25/2021 91.3  80.0 - 100.0 fL Final   MCH 10/25/2021 29.1  26.0 - 34.0 pg Final   MCHC 10/25/2021 31.9  30.0 - 36.0 g/dL Final   RDW 10/25/2021 14.2  11.5 - 15.5 % Final   Platelets 10/25/2021 416 (H)   150 - 400 K/uL Final   nRBC 10/25/2021 0.0  0.0 - 0.2 % Final   Performed at [facility], [place] [address] [place], [place] [address]   Sodium 10/25/2021 137  135 - 145 mmol/L Final   Potassium 10/25/2021 3.9  3.5 - 5.1 mmol/L Final   DELTA CHECK NOTED   Chloride 10/25/2021 106  98 - 111 mmol/L Final   CO2 10/25/2021 24  22 - 32 mmol/L Final   Glucose, Bld 10/25/2021 85  70 - 99 mg/dL Final   Glucose reference range applies only to samples taken after fasting for at least 8 hours.   BUN 10/25/2021 6  6 - 20 mg/dL Final   Creatinine, Ser 10/25/2021 0.68  0.61 - 1.24 mg/dL Final   Calcium 10/25/2021 8.4 (L)  8.9 - 10.3 mg/dL Final   GFR, Estimated 10/25/2021 >60  >60 mL/min Final   Comment: (NOTE) Calculated using the CKD-EPI Creatinine Equation (2021)    Anion gap 10/25/2021 7  5 - 15 Final   Performed at [facility], [place] [address] [place], [place] [address]   Magnesium 10/25/2021 1.8  1.7 - 2.4 mg/dL Final   Performed at [place] [address], [place], [place] [address]   Sodium 10/26/2021 134 (L)  135 - 145 mmol/L Final   Potassium 10/26/2021 3.5  3.5 - 5.1 mmol/L Final   Chloride 10/26/2021 105  98 - 111 mmol/L Final   CO2 10/26/2021 22  22 - 32 mmol/L Final   Glucose, Bld 10/26/2021 106 (H)  70 - 99 mg/dL Final   Glucose reference range applies only to samples taken after fasting for at least 8 hours.   BUN 10/26/2021 5 (L)  6 - 20 mg/dL Final   Creatinine, Ser 10/26/2021 0.63  0.61 - 1.24 mg/dL Final   Calcium 10/26/2021 8.2 (L)  8.9 - 10.3 mg/dL Final   GFR, Estimated 10/26/2021 >60  >60 mL/min Final   Comment: (NOTE) Calculated using the CKD-EPI Creatinine Equation (2021)    Anion gap 10/26/2021 7  5 - 15 Final   Performed at [facility], [place] [address], [place],  [address]   WBC 10/26/2021 7.3  4.0 - 10.5 K/uL Final   RBC 10/26/2021 3.16 (L)  4.22 - 5.81 MIL/uL Final    Hemoglobin 10/26/2021 9.3 (L)  13.0 - 17.0 g/dL Final   HCT 10/26/2021 28.9 (L)  39.0 - 52.0 % Final   MCV 10/26/2021 91.5  80.0 - 100.0 fL Final   MCH 10/26/2021 29.4  26.0 - 34.0 pg Final   MCHC 10/26/2021 32.2  30.0 - 36.0 g/dL Final   RDW 10/26/2021 14.0  11.5 - 15.5 % Final   Platelets 10/26/2021 331  150 - 400 K/uL Final   nRBC 10/26/2021 0.0  0.0 - 0.2 % Final   Performed at [facility], [place] [address] [place], [place] [address]  No results displayed because visit has over 200 results.    Admission on 09/16/2021, Discharged on 09/17/2021  Component Date Value Ref Range Status   B Natriuretic Peptide 09/16/2021 8.8  0.0 - 100.0 pg/mL Final   Performed at [facility], [place] [address] [place], [place] [address]   Sodium 09/16/2021 138  135 - 145 mmol/L Final   Potassium 09/16/2021 2.9 (L)  3.5 - 5.1 mmol/L Final   Chloride 09/16/2021 97 (L)  98 - 111 mmol/L Final   CO2 09/16/2021 31  22 - 32 mmol/L Final   Glucose, Bld 09/16/2021 92  70 - 99 mg/dL Final   Glucose reference range applies only to samples taken after fasting for at least 8 hours.   BUN 09/16/2021 10  6 - 20 mg/dL Final   Creatinine, Ser 09/16/2021 0.61  0.61 - 1.24 mg/dL Final   Calcium 09/16/2021 9.2  8.9 - 10.3 mg/dL Final   Total Protein 09/16/2021 7.6  6.5 - 8.1 g/dL Final   Albumin 09/16/2021 4.0  3.5 - 5.0 g/dL Final   AST 09/16/2021 35  15 - 41 U/L Final   ALT 09/16/2021 16  0 - 44 U/L Final   Alkaline Phosphatase 09/16/2021 73  38 - 126 U/L Final   Total Bilirubin 09/16/2021 0.8  0.3 - 1.2 mg/dL Final   GFR, Estimated 09/16/2021 >60  >60 mL/min Final   Comment: (NOTE) Calculated using the CKD-EPI Creatinine Equation (2021)    Anion gap 09/16/2021 10  5 - 15 Final   Performed at [facility], [place] [address], [place], [place] [address]   WBC 09/16/2021 9.1  4.0 - 10.5 K/uL Final   RBC 09/16/2021 5.13  4.22 - 5.81 MIL/uL Final   Hemoglobin  09/16/2021 15.5  13.0 - 17.0 g/dL Final   HCT 09/16/2021 45.1  39.0 - 52.0 % Final   MCV 09/16/2021 87.9  80.0 - 100.0 fL Final   MCH 09/16/2021 30.2  26.0 - 34.0 pg Final   MCHC 09/16/2021 34.4  30.0 - 36.0 g/dL Final   RDW 09/16/2021 12.9  11.5 - 15.5 % Final   Platelets 09/16/2021 246  150 - 400 K/uL Final   nRBC 09/16/2021 0.0  0.0 - 0.2 % Final   Neutrophils Relative % 09/16/2021 47  % Final   Neutro Abs 09/16/2021 4.4  1.7 - 7.7 K/uL Final   Lymphocytes Relative 09/16/2021 40  % Final   Lymphs Abs 09/16/2021 3.6  0.7 - 4.0 K/uL Final   Monocytes Relative 09/16/2021 9  % Final   Monocytes Absolute 09/16/2021 0.8  0.1 - 1.0 K/uL Final   Eosinophils Relative 09/16/2021 3  % Final   Eosinophils Absolute 09/16/2021 0.2  0.0 - 0.5 K/uL Final   Basophils Relative 09/16/2021 1  % Final   Basophils Absolute 09/16/2021 0.1  0.0 - 0.1 K/uL Final   Immature Granulocytes 09/16/2021 0  % Final   Abs Immature Granulocytes 09/16/2021 0.02  0.00 - 0.07 K/uL Final   Performed at [facility], [place] [address], [place], [place] [address]   Magnesium 09/16/2021 1.9  1.7 - 2.4 mg/dL Final   Performed at [place] [address], [place], [place] [address]   Troponin I (High Sensitivity) 09/16/2021 6  <18 ng/L Final   Comment: (NOTE) Elevated high sensitivity troponin I (hsTnI) values and significant  changes across serial measurements may  suggest ACS but many other  chronic and acute conditions are known to elevate hsTnI results.  Refer to the "Links" section for chest pain algorithms and additional  guidance. Performed at [facility], [place] [address], [place], [place] [address]    SARS Coronavirus 2 by RT PCR 09/16/2021 NEGATIVE  NEGATIVE Final   Comment: (NOTE) SARS-CoV-2 target nucleic acids are NOT DETECTED.  The SARS-CoV-2 RNA is generally detectable in upper respiratory specimens during the acute phase of infection. The  lowest concentration of SARS-CoV-2 viral copies this assay can detect is 138 copies/mL. A negative result does not preclude SARS-Cov-2 infection and should not be used as the sole basis for treatment or other patient management decisions. A negative result may occur with  improper specimen collection/handling, submission of specimen other than nasopharyngeal swab, presence of viral mutation(s) within the areas targeted by this assay, and inadequate number of viral copies(<138 copies/mL). A negative result must be combined with clinical observations, patient history, and epidemiological information. The expected result is Negative.  Fact Sheet for Patients:  EntrepreneurPulse.com.au  Fact Sheet for Healthcare Providers:  IncredibleEmployment.be  This test is no                          t yet approved or cleared by the [place] FDA and  has been authorized for detection and/or diagnosis of SARS-CoV-2 by FDA under an Emergency Use Authorization (EUA). This EUA will remain  in effect (meaning this test can be used) for the duration of the COVID-19 declaration under Section 564(b)(1) of the Act, 21 U.S.C.section 360bbb-3(b)(1), unless the authorization is terminated  or revoked sooner.       Influenza A by PCR 09/16/2021 NEGATIVE  NEGATIVE Final   Influenza B by PCR 09/16/2021 NEGATIVE  NEGATIVE Final   Comment: (NOTE) The Xpert Xpress SARS-CoV-2/FLU/RSV plus assay is intended as an aid in the diagnosis of influenza from Nasopharyngeal swab specimens and should not be used as a sole basis for treatment. Nasal washings and aspirates are unacceptable for Xpert Xpress SARS-CoV-2/FLU/RSV testing.  Fact Sheet for Patients: EntrepreneurPulse.com.au  Fact Sheet for Healthcare Providers: IncredibleEmployment.be  This test is not yet approved or cleared by the [place] FDA and has been authorized for  detection and/or diagnosis of SARS-CoV-2 by FDA under an Emergency Use Authorization (EUA). This EUA will remain in effect (meaning this test can be used) for the duration of the COVID-19 declaration under Section 564(b)(1) of the Act, 21 U.S.C. section 360bbb-3(b)(1), unless the authorization is terminated or revoked.  Performed at [facility], [place] [address], [place], [place] [address]    D-Dimer, Quant 09/16/2021 0.70 (H)  0.00 - 0.50 ug/mL-FEU Final   Comment: (NOTE) At the manufacturer cut-off value of 0.5 g/mL FEU, this assay has a negative predictive value of 95-100%.This assay is intended for use in conjunction with a clinical pretest probability (PTP) assessment model to exclude pulmonary embolism (PE) and deep venous thrombosis (DVT) in outpatients suspected of PE or DVT. Results should be correlated with clinical presentation. Performed at [facility], [place] [address], [place], [place] [address]    Troponin I (High Sensitivity) 09/16/2021 5  <18 ng/L Final   Comment: (NOTE) Elevated high sensitivity troponin I (hsTnI) values and significant  changes across serial measurements may suggest ACS but many other  chronic and acute conditions are known to elevate hsTnI results.  Refer to the "Links" section  for chest pain algorithms and additional  guidance. Performed at [facility], [place] [address] [place], [place] [address]     Blood Alcohol level:  Lab Results  Component Value Date   ETH 77 (H) 50/53/9767    Metabolic Disorder Labs: Lab Results  Component Value Date   HGBA1C 4.4 (L) 11/24/2021   MPG 80 11/24/2021   No results found for: PROLACTIN Lab Results  Component Value Date   CHOL 149 11/24/2021   TRIG 81 11/24/2021   HDL 79 11/24/2021   CHOLHDL 1.9 11/24/2021   VLDL 16 11/24/2021   LDLCALC 54 11/24/2021    Therapeutic Lab Levels: No results found for: LITHIUM No results found for:  VALPROATE No components found for:  CBMZ  Physical Findings   Flowsheet Row ED from 11/24/2021 in [facility] ED to Hosp-Admission (Discharged) from 10/23/2021 in [facility] ED to Hosp-Admission (Discharged) from 10/04/2021 in [facility] [place] [facility]  C-SSRS RISK CATEGORY High Risk No Risk No Risk        Musculoskeletal  Strength & Muscle Tone: within normal limits Gait & Station: normal Patient leans: N/A  Psychiatric Specialty Exam  Presentation  General Appearance: Appropriate for Environment; Casual  Eye Contact:Fair  Speech:Clear and Coherent; Normal Rate  Speech Volume:Normal  Handedness:Right   Mood and Affect  Mood:Dysphoric  Affect:Appropriate; Congruent   Thought Process  Thought Processes:Coherent; Goal Directed; Linear  Descriptions of Associations:Intact  Orientation:Full (Time, Place and Person)  Thought Content:Logical  Diagnosis of Schizophrenia or Schizoaffective disorder in past: No    Hallucinations:Hallucinations: None  Ideas of Reference:None  Suicidal Thoughts:Suicidal Thoughts: Yes, Passive  Homicidal Thoughts:Homicidal Thoughts: No   Sensorium  Memory:Immediate Good; Recent Good; Remote Good  Judgment:Fair  Insight:Fair   Executive Functions  Concentration:Fair  Attention Span:Fair  [place] of Knowledge:Good  Language:Good   Psychomotor Activity  Psychomotor Activity:Psychomotor Activity: Normal   Assets  Assets:Communication Skills; Desire for Improvement; Resilience; Social Support   Sleep  Sleep:Sleep: Poor   No data recorded   Physical Exam  Physical Exam Constitutional:      Appearance: Normal appearance.  HENT:     Head: Normocephalic.     Nose: Nose normal.  Eyes:     Conjunctiva/sclera: Conjunctivae normal.  Cardiovascular:     Rate and Rhythm: Normal rate and regular rhythm.     Heart sounds: Normal heart sounds.     Comments:  Hypertensive  Pulmonary:     Effort: Pulmonary effort is normal.     Breath sounds: Normal breath sounds.  Abdominal:     General: Abdomen is flat. Bowel sounds are normal.     Palpations: Abdomen is soft.  Musculoskeletal:        General: Normal range of motion.     Cervical back: Normal range of motion.  Neurological:     Mental Status: He is alert and oriented to person, place, and time.   Review of Systems  Constitutional: Negative.  Negative for fever.  HENT: Negative.    Eyes: Negative.  Negative for blurred vision.  Respiratory: Negative.    Cardiovascular: Negative.  Negative for chest pain, palpitations and leg swelling.  Gastrointestinal:  Positive for abdominal pain.       Mild pain where a drain was during a previous surgery  Genitourinary: Negative.   Musculoskeletal: Negative.   Skin: Negative.   Neurological:  Positive for headaches.  Endo/Heme/Allergies: Negative.   Psychiatric/Behavioral:  Positive for depression and suicidal ideas. Negative for hallucinations. The patient is nervous/anxious and has insomnia.   Blood pressure (!) 148/90, pulse 100, temperature (!) 97.3 F (36.3 C), temperature source Temporal, resp. rate 18, SpO2 97 %. There is no height or weight on file to calculate BMI.  Treatment Plan Summary: 45 year old male with history of severe MDD with psychosis who presented to the [facility] on 11/26 for SI in the context of numerous life stressors-being diagnosed with cancer approximately 1 year ago, recently being hospitalized for appendicitis, marital conflict, financial difficulties and recent homelessness.  Patient was admitted to the [facility] for further treatment.  Patient reporting ongoing anxiety as well as depressive symptoms with passive SI today.  Discussed medication adjustments with patient-he is agreeable to increasing Risperdal, Effexor; increasing lisinopril; discontinuing Remeron. Qtc 455.  MDD, severe -Increase Effexor 75 mg po daily to 150 mg  (11/29) from 75 mg (started 11/26) -adjust risperdal from 0.5 mg qhs to 0.5 mg qam/1 mg qhs as adjunct for mood -PRN trazodone 50 mg qhs  -stop remeron 7.5 mg-received 1 time dose on 11/27. Discontinued in an effort to decrease polypharmacy.  On clarification of decreased appetite it appears that patient has reported decreased appetite while at the [facility] due to not receiving hot meals-does not appear to be related to mood disorder  Alcohol use -continue gabapentin 300 mg BID for alcohol crabings (started 11/27)  HTN Increase lisinopril 5 mg po daily to 10 mg daily (11/29) Care instruction order ordered to check b/p manually   Nicotine dependence -nicoderm cq patch 21 mg  Asthma -patient reports history of asthma as a child -PRN albuterol  Dispo:  ongoing. SW assisting, patient with interest in discharging to rescue mission or possible sober living.  Ival Bible, MD 11/26/2021 1:09 PM

## 2021-11-26 NOTE — ED Notes (Signed)
Pt asleep in bed. Respirations even and unlabored. Will continue to monitor for safety. ?

## 2021-11-26 NOTE — ED Notes (Signed)
Patient denies [place]. Respiratory is even and unlabored. No distress noted. Patient is in the dinning room watching TV. Patient is cooperative and interacts well with staff and peers. Patient asked nurse what medication he is on and this nurse explained of pt medications ordered for him. Will continue to monitor for safety.

## 2021-11-26 NOTE — ED Notes (Signed)
Pt came to nurse and complained about having congestion and also requested for anxiety medication.

## 2021-11-26 NOTE — ED Notes (Signed)
Pt is in the bed sleeping. Respirations are even and unlabored. No acute distress noted. Will continue o monitor pt for safety.

## 2021-11-26 NOTE — ED Notes (Signed)
Patient is presently asleep in bed without distress or complaint.  Will monitor and provide support as needed.

## 2021-11-26 NOTE — Progress Notes (Signed)
Patient awake and alert watching tv in day room with peer.  Patient asked for and received sudafed for congestion.  Afebrile.  He ate breakfast and is without further complaint.  Will continue to monitor.

## 2021-11-26 NOTE — Progress Notes (Signed)
Patient started on risperdal and received first does along with his request for vistoril.  Patient continuesto watch soccer in the day room with peers.  Will monitor.

## 2021-11-27 DIAGNOSIS — F1090 Alcohol use, unspecified, uncomplicated: Secondary | ICD-10-CM | POA: Diagnosis not present

## 2021-11-27 DIAGNOSIS — Z20822 Contact with and (suspected) exposure to covid-19: Secondary | ICD-10-CM | POA: Diagnosis not present

## 2021-11-27 DIAGNOSIS — F332 Major depressive disorder, recurrent severe without psychotic features: Secondary | ICD-10-CM | POA: Diagnosis not present

## 2021-11-27 DIAGNOSIS — R45851 Suicidal ideations: Secondary | ICD-10-CM | POA: Diagnosis not present

## 2021-11-27 MED ORDER — NICOTINE POLACRILEX 2 MG MT GUM
CHEWING_GUM | OROMUCOSAL | Status: AC
  Filled 2021-11-27: qty 1

## 2021-11-27 MED ORDER — LORATADINE 10 MG PO TABS
ORAL_TABLET | ORAL | Status: AC
  Filled 2021-11-27: qty 1

## 2021-11-27 MED ORDER — SALINE SPRAY 0.65 % NA SOLN
1.0000 | NASAL | Status: DC | PRN
  Administered 2021-11-27: 1 via NASAL

## 2021-11-27 MED ORDER — LORATADINE 10 MG PO TABS
10.0000 mg | ORAL_TABLET | Freq: Once | ORAL | Status: AC
  Administered 2021-11-27: 10 mg via ORAL

## 2021-11-27 MED ORDER — SALINE SPRAY 0.65 % NA SOLN
NASAL | Status: AC
  Filled 2021-11-27: qty 44

## 2021-11-27 MED ORDER — NICOTINE POLACRILEX 2 MG MT GUM
2.0000 mg | CHEWING_GUM | OROMUCOSAL | Status: DC | PRN
  Administered 2021-11-27 – 2021-11-28 (×5): 2 mg via ORAL
  Filled 2021-11-27 (×2): qty 1

## 2021-11-27 NOTE — ED Notes (Addendum)
Pt attended group 
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
## 2021-11-27 NOTE — ED Notes (Signed)
Pt is currently sleeping, no distress noted, environmental check complete, will continue to monitor patient for safety. ? ?

## 2021-11-27 NOTE — ED Notes (Signed)
Pt received Tylenol for c/o abd pain 5/10

## 2021-11-27 NOTE — ED Notes (Signed)
Pt sitting in lobby talking with peer. Med seeking tendencies noted. Pt states, "I'm just use to having medications". Pt received Vistaril 25 mg for increased anxiety and Tylenol for abd pain 5/10. Safety maintained.

## 2021-11-27 NOTE — ED Notes (Signed)
Pt is in the bed sleeping. Respirations are even and unlabored. No acute distress noted. Will continue to monitor for safety. 

## 2021-11-27 NOTE — ED Notes (Signed)
Patient denies [place].  Patient is cooperative and interacts well with staff and peer.  Respiratory is even and unlabored. No distress noted. Pt is watching TV in the dinning room at  present. Patient stated no complaints at present. will continue to monitor for safety.

## 2021-11-27 NOTE — Clinical Social Work Psych Note (Signed)
Self-Sabotaging Behaviors  Self-Sabotaging Behaviors & Self Awareness   Date: 11/27/21  Type of Therapy and Topic:   Participation Level: Active   Objective: In this group, patients will learn how to identify obstacles, self-sabotaging and enabling behaviors, as well as: what are they, why do we do them and what needs these behaviors meet. Discuss unhealthy relationships and how to have positive healthy boundaries with those that sabotage and enable. Explore aspects of self-sabotage and enabling in yourself and how to limit these self-destructive behaviors in everyday life.  Therapeutic Goals:  Patient will identify one obstacle that relates to self-sabotage and enabling behaviors Patient will identify one personal self-sabotaging or enabling behavior they did prior to admission Patient will state a plan to change the above identified behavior Patient will demonstrate ability to communicate their needs through discussion and/or role play.   Summary of Patient's Progress:  Joshua Cooper received worksheets addressing the objective and therapeutic goals listed above.

## 2021-11-27 NOTE — Group Note (Signed)
Group Topic: Understanding Self  Group Date: 11/27/2021 Start Time: 1030 End Time: 1144 Facilitators: Adaline Sill D, NT  Department: [facility]  [facility] LCSW Group Therapy Note   Group Date: @GROUPDATE @ Start Time: @GROUPSTARTTIME @ End Time: @GROUPENDTIME @  Type of Therapy and Topic:  Group Therapy:  Feelings around Relapse and Recovery  Participation Level:  Active   Mood:  Description of Group:    Patients in this group will discuss emotions they experience before and after a relapse. They will process how experiencing these feelings, or avoidance of experiencing them, relates to having a relapse. Facilitator will guide patients to explore emotions they have related to recovery. Patients will be encouraged to process which emotions are more powerful. They will be guided to discuss the emotional reaction significant others in their lives may have to patients' relapse or recovery. Patients will be assisted in exploring ways to respond to the emotions of others without this contributing to a relapse.  Therapeutic Goals: Patient will identify two or more emotions that lead to relapse for them:  Patient will identify two emotions that result when they relapse:  Patient will identify two emotions related to recovery:  Patient will demonstrate ability to communicate their needs through discussion and/or role plays.   Summary of Patient Progress:  Everyone participated and responding accordingly.    Therapeutic Modalities:   Cognitive Behavioral Therapy Solution-Focused Therapy Assertiveness Training Relapse Prevention Therapy   Erline Levine, NTNumber of Participants: 3  Group Focus: acceptance, communication, coping skills, depression, and feeling awareness/expression Treatment Modality:  Behavior Modification Therapy Interventions utilized were clarification, exploration, group exercise, and patient education Purpose: enhance coping  skills, express feelings, and improve communication skills  Name: Clebert Bodkin Date of Birth: [birth year]  MR: [id number]    Level of Participation: active Quality of Participation: attentive, cooperative, engaged, and motivated Interactions with others: gave feedback Mood/Affect: appropriate and positive Triggers (if applicable): not having a safe place to stay.  Cognition: concrete and goal directed Progress: Minimal Response:  Plan: patient will be encouraged to set goals and journal.   Patients Problems:  Patient Active Problem List   Diagnosis Date Noted   MDD (major depressive disorder), recurrent severe, without psychosis ([place]) 11/25/2021   Small bowel obstruction ([place]) 10/23/2021   Perforated appendicitis 10/05/2021   Acute appendicitis 10/04/2021

## 2021-11-27 NOTE — ED Notes (Signed)
Snacks given 

## 2021-11-27 NOTE — Progress Notes (Signed)
Patient awake and alert.  He asked for and received 650mg  tylenol PO for abdominal pain at old suture site.  He also asked for sudafed and received same for congestion.  He asked for vistoril and received med for anxiety.  He is calm  and in good behavioral control.  Denies avh shi or plan.  Will monitor.

## 2021-11-27 NOTE — ED Provider Notes (Signed)
Behavioral Health Progress Note  Date and Time: 11/27/2021 4:34 PM Name: Joshua Cooper MRN:  [id number]  Subjective:    45 year old man with a history of depression awith psychosis who presented to the [facility] [place] with GPD  on 11/24/2021 for SI.  Patient was restarted on his home medication and admitted to the [facility] for further treatment   Patient seen and chart reviewed-has been medication compliant and been attending groups on the unit.  He has been appropriate with staff and peers on the unit.  Patient interviewed this afternoon he describes his mood as "okay".  He describes experiencing anxiety and states that he had "fleeting suicidal thoughts" when he learned that he would not be able to have a job if he went to [employer].  In discussing disposition, patient indicates that he does not want to go to a shelter and that he does not have enough money to pay for an [place] as he does not have an income currently.  Patient states that he has attempted to contact sober living of [place] but has been unsuccessful in reaching anybody; he also states that he heard that they were "restrictive".  Patient denies current SI/HI/AVH.  He reports sleeping better last night; however, he reports that he will still wake up 3-4 times a night.  Patient denies all physical complaints apart from some mild abdominal pain at the site of a previous drain from his surgery and a mild headache.  Patient discusses that his stay at the [facility] has been helpful; however, he states that there is "nothing to do".  Patient states that he would like to go to the [facility] and start filling out an application for Medicaid.  Patient also states that the chairs are uncomfortable in the day room; however, he notes that the food is "better" now that he has been able to get warm food.  Discussed with patient that overall his blood pressure has improved with increased dose of lisinopril.       Diagnosis:  Final diagnoses:   Severe episode of recurrent major depressive disorder, without psychotic features ([place])  Suicidal ideation    Total Time spent with patient: 30 minutes  Past Psychiatric History: hx of depression with psychosis Past Medical History:  Past Medical History:  Diagnosis Date   Testicular cancer [facility])     Past Surgical History:  Procedure Laterality Date   CERVICAL FUSION     LAPAROSCOPIC APPENDECTOMY N/A 10/04/2021   Procedure: APPENDECTOMY LAPAROSCOPIC;  Surgeon: Leighton Ruff, MD;  Location: [facility];  Service: General;  Laterality: N/A;   TONSILLECTOMY     Family History: History reviewed. No pertinent family history. Family Psychiatric  History: mother with alcoholism Social History:  Social History   Substance and Sexual Activity  Alcohol Use Yes   Alcohol/week: 2.0 standard drinks   Types: 2 Cans of beer per week     Social History   Substance and Sexual Activity  Drug Use Not Currently    Social History   Socioeconomic History   Marital status: Married    Spouse name: Not on file   Number of children: Not on file   Years of education: Not on file   Highest education level: Not on file  Occupational History   Not on file  Tobacco Use   Smoking status: Some Days    Types: Cigarettes   Smokeless tobacco: Never  Substance and Sexual Activity   Alcohol use: Yes    Alcohol/week:  2.0 standard drinks    Types: 2 Cans of beer per week   Drug use: Not Currently   Sexual activity: Not on file  Other Topics Concern   Not on file  Social History Narrative   Not on file   Social Determinants of Health   Financial Resource Strain: Not on file  Food Insecurity: Not on file  Transportation Needs: Not on file  Physical Activity: Not on file  Stress: Not on file  Social Connections: Not on file   SDOH:  SDOH Screenings   Alcohol Screen: Not on file  Depression (PHQ2-9): Not on file  Financial Resource Strain: Not on file  Food Insecurity: Not on file   Housing: Not on file  Physical Activity: Not on file  Social Connections: Not on file  Stress: Not on file  Tobacco Use: High Risk   Smoking Tobacco Use: Some Days   Smokeless Tobacco Use: Never   Passive Exposure: Not on file  Transportation Needs: Not on file   Additional Social History:    Pain Medications: None Prescriptions: Effexor XR 150 mg per day (one pill in AM).  Ran out 6 days ago.  Has a refill waiting [facility] on [address]) Over the Counter: Ibuprophen History of alcohol / drug use?: Yes Negative Consequences of Use: Personal relationships Withdrawal Symptoms: Patient aware of relationship between substance abuse and physical/medical complications, None Name of Substance 1: ETOH 1 - Age of First Use: teens 1 - Amount (size/oz): Will drink a 6 paick in a day 1 - Frequency: Less than daily, about 3-4 times ina  week 1 - Duration: off and on 1 - Last Use / Amount: 11/26 around 02;00 1 - Method of Aquiring: purchase 1- Route of Use: oral   Current Medications:  Current Facility-Administered Medications  Medication Dose Route Frequency Provider Last Rate Last Admin   acetaminophen (TYLENOL) tablet 650 mg  650 mg Oral Q6H PRN Nwoko, Uchenna E, PA   650 mg at 11/27/21 1614   albuterol (VENTOLIN HFA) 108 (90 Base) MCG/ACT inhaler 2 puff  2 puff Inhalation Q4H PRN Ival Bible, MD   2 puff at 11/27/21 1309   alum & mag hydroxide-simeth (MAALOX/MYLANTA) 200-200-20 MG/5ML suspension 30 mL  30 mL Oral Q4H PRN Nwoko, Uchenna E, PA       gabapentin (NEURONTIN) capsule 300 mg  300 mg Oral BID Bobbitt, Shalon E, NP   300 mg at 11/27/21 0938   hydrOXYzine (ATARAX/VISTARIL) tablet 25 mg  25 mg Oral TID PRN Nwoko, Uchenna E, PA   25 mg at 11/27/21 1623   lisinopril (ZESTRIL) tablet 10 mg  10 mg Oral Daily Ival Bible, MD   10 mg at 11/27/21 2355   magnesium hydroxide (MILK OF MAGNESIA) suspension 30 mL  30 mL Oral Daily PRN Nwoko, Uchenna E, PA        multivitamin with minerals tablet 1 tablet  1 tablet Oral Daily White, Patrice L, NP   1 tablet at 11/27/21 0938   nicotine (NICODERM CQ - dosed in mg/24 hours) patch 21 mg  21 mg Transdermal Daily White, Patrice L, NP   21 mg at 11/27/21 0849   nicotine polacrilex (NICORETTE) gum 2 mg  2 mg Oral PRN Ival Bible, MD   2 mg at 11/27/21 1615   pseudoephedrine (SUDAFED) 12 hr tablet 120 mg  120 mg Oral Q12H PRN Bobbitt, Shalon E, NP   120 mg at 11/27/21 0944   risperiDONE (  RISPERDAL) tablet 0.5 mg  0.5 mg Oral Daily Ival Bible, MD   0.5 mg at 11/27/21 6045   And   risperiDONE (RISPERDAL) tablet 1 mg  1 mg Oral QHS Ival Bible, MD   1 mg at 11/26/21 2116   thiamine tablet 100 mg  100 mg Oral Daily White, Patrice L, NP   100 mg at 11/27/21 0939   traZODone (DESYREL) tablet 50 mg  50 mg Oral QHS PRN Ival Bible, MD   50 mg at 11/26/21 2116   venlafaxine XR (EFFEXOR-XR) 24 hr capsule 150 mg  150 mg Oral Q breakfast Ival Bible, MD   150 mg at 11/27/21 0840   Current Outpatient Medications  Medication Sig Dispense Refill   venlafaxine XR (EFFEXOR-XR) 150 MG 24 hr capsule Take 150 mg by mouth every evening.      Labs  Lab Results:  Admission on 11/24/2021  Component Date Value Ref Range Status   SARS Coronavirus 2 by RT PCR 11/24/2021 NEGATIVE  NEGATIVE Final   Comment: (NOTE) SARS-CoV-2 target nucleic acids are NOT DETECTED.  The SARS-CoV-2 RNA is generally detectable in upper respiratory specimens during the acute phase of infection. The lowest concentration of SARS-CoV-2 viral copies this assay can detect is 138 copies/mL. A negative result does not preclude SARS-Cov-2 infection and should not be used as the sole basis for treatment or other patient management decisions. A negative result may occur with  improper specimen collection/handling, submission of specimen other than nasopharyngeal swab, presence of viral mutation(s) within the areas  targeted by this assay, and inadequate number of viral copies(<138 copies/mL). A negative result must be combined with clinical observations, patient history, and epidemiological information. The expected result is Negative.  Fact Sheet for Patients:  EntrepreneurPulse.com.au  Fact Sheet for Healthcare Providers:  IncredibleEmployment.be  This test is no                          t yet approved or cleared by the [place] FDA and  has been authorized for detection and/or diagnosis of SARS-CoV-2 by FDA under an Emergency Use Authorization (EUA). This EUA will remain  in effect (meaning this test can be used) for the duration of the COVID-19 declaration under Section 564(b)(1) of the Act, 21 U.S.C.section 360bbb-3(b)(1), unless the authorization is terminated  or revoked sooner.       Influenza A by PCR 11/24/2021 NEGATIVE  NEGATIVE Final   Influenza B by PCR 11/24/2021 NEGATIVE  NEGATIVE Final   Comment: (NOTE) The Xpert Xpress SARS-CoV-2/FLU/RSV plus assay is intended as an aid in the diagnosis of influenza from Nasopharyngeal swab specimens and should not be used as a sole basis for treatment. Nasal washings and aspirates are unacceptable for Xpert Xpress SARS-CoV-2/FLU/RSV testing.  Fact Sheet for Patients: EntrepreneurPulse.com.au  Fact Sheet for Healthcare Providers: IncredibleEmployment.be  This test is not yet approved or cleared by the [place] FDA and has been authorized for detection and/or diagnosis of SARS-CoV-2 by FDA under an Emergency Use Authorization (EUA). This EUA will remain in effect (meaning this test can be used) for the duration of the COVID-19 declaration under Section 564(b)(1) of the Act, 21 U.S.C. section 360bbb-3(b)(1), unless the authorization is terminated or revoked.  Performed at [facility], [place] [address], [place], [place] [address]    WBC 11/24/2021  7.5  4.0 - 10.5 K/uL Final   RBC 11/24/2021 4.29  4.22 -  5.81 MIL/uL Final   Hemoglobin 11/24/2021 13.3  13.0 - 17.0 g/dL Final   HCT 11/24/2021 40.3  39.0 - 52.0 % Final   MCV 11/24/2021 93.9  80.0 - 100.0 fL Final   MCH 11/24/2021 31.0  26.0 - 34.0 pg Final   MCHC 11/24/2021 33.0  30.0 - 36.0 g/dL Final   RDW 11/24/2021 15.5  11.5 - 15.5 % Final   Platelets 11/24/2021 284  150 - 400 K/uL Final   nRBC 11/24/2021 0.0  0.0 - 0.2 % Final   Neutrophils Relative % 11/24/2021 35  % Final   Neutro Abs 11/24/2021 2.6  1.7 - 7.7 K/uL Final   Lymphocytes Relative 11/24/2021 49  % Final   Lymphs Abs 11/24/2021 3.8  0.7 - 4.0 K/uL Final   Monocytes Relative 11/24/2021 6  % Final   Monocytes Absolute 11/24/2021 0.4  0.1 - 1.0 K/uL Final   Eosinophils Relative 11/24/2021 8  % Final   Eosinophils Absolute 11/24/2021 0.6 (H)  0.0 - 0.5 K/uL Final   Basophils Relative 11/24/2021 2  % Final   Basophils Absolute 11/24/2021 0.1  0.0 - 0.1 K/uL Final   Immature Granulocytes 11/24/2021 0  % Final   Abs Immature Granulocytes 11/24/2021 0.01  0.00 - 0.07 K/uL Final   Performed at [facility], [place] [address], [place], [place] [address]   Sodium 11/24/2021 140  135 - 145 mmol/L Final   Potassium 11/24/2021 3.5  3.5 - 5.1 mmol/L Final   Chloride 11/24/2021 99  98 - 111 mmol/L Final   CO2 11/24/2021 24  22 - 32 mmol/L Final   Glucose, Bld 11/24/2021 93  70 - 99 mg/dL Final   Glucose reference range applies only to samples taken after fasting for at least 8 hours.   BUN 11/24/2021 <5 (L)  6 - 20 mg/dL Final   Creatinine, Ser 11/24/2021 0.58 (L)  0.61 - 1.24 mg/dL Final   Calcium 11/24/2021 9.0  8.9 - 10.3 mg/dL Final   Total Protein 11/24/2021 6.8  6.5 - 8.1 g/dL Final   Albumin 11/24/2021 3.6  3.5 - 5.0 g/dL Final   AST 11/24/2021 38  15 - 41 U/L Final   ALT 11/24/2021 17  0 - 44 U/L Final   Alkaline Phosphatase 11/24/2021 86  38 - 126 U/L Final   Total Bilirubin 11/24/2021 1.0  0.3 - 1.2 mg/dL  Final   GFR, Estimated 11/24/2021 >60  >60 mL/min Final   Comment: (NOTE) Calculated using the CKD-EPI Creatinine Equation (2021)    Anion gap 11/24/2021 17 (H)  5 - 15 Final   Performed at [facility] [address] [place], [place] [address]   Hgb A1c MFr Bld 11/24/2021 4.4 (L)  4.8 - 5.6 % Final   Comment: (NOTE)         Prediabetes: 5.7 - 6.4         Diabetes: >6.4         Glycemic control for adults with diabetes: <7.0    Mean Plasma Glucose 11/24/2021 80  mg/dL Final   Comment: (NOTE) Performed At: [facility] [place], [place] [id number] Rush Farmer MD BP:[id number]    Alcohol, Ethyl (B) 11/24/2021 77 (H)  <10 mg/dL Final   Comment: (NOTE) Lowest detectable limit for serum alcohol is 10 mg/dL.  For medical purposes only. Performed at [facility], [place] [address] [place], [place] [address]    TSH 11/24/2021 0.853  0.350 - 4.500 uIU/mL Final   Comment: Performed by a 3rd Generation assay with a functional sensitivity of <=0.01 uIU/mL. Performed at [facility], [place] [address] [place], [place] [address]    POC Amphetamine UR 11/24/2021 None Detected  NONE DETECTED (Cut Off Level 1000 ng/mL) Final   POC Secobarbital (BAR) 11/24/2021 None Detected  NONE DETECTED (Cut Off Level 300 ng/mL) Final   POC Buprenorphine (BUP) 11/24/2021 None Detected  NONE DETECTED (Cut Off Level 10 ng/mL) Final   POC Oxazepam (BZO) 11/24/2021 None Detected  NONE DETECTED (Cut Off Level 300 ng/mL) Final   POC Cocaine UR 11/24/2021 None Detected  NONE DETECTED (Cut Off Level 300 ng/mL) Final   POC Methamphetamine UR 11/24/2021 None Detected  NONE DETECTED (Cut Off Level 1000 ng/mL) Final   POC Morphine 11/24/2021 None Detected  NONE DETECTED (Cut Off Level 300 ng/mL) Final   POC Oxycodone UR 11/24/2021 None Detected  NONE DETECTED (Cut Off Level 100 ng/mL) Final   POC Methadone UR 11/24/2021 None Detected  NONE DETECTED (Cut Off Level 300 ng/mL) Final    POC Marijuana UR 11/24/2021 None Detected  NONE DETECTED (Cut Off Level 50 ng/mL) Final   SARS Coronavirus 2 Ag 11/24/2021 Negative  Negative Preliminary   SARSCOV2ONAVIRUS 2 AG 11/24/2021 NEGATIVE  NEGATIVE Final   Comment: (NOTE) SARS-CoV-2 antigen NOT DETECTED.   Negative results are presumptive.  Negative results do not preclude SARS-CoV-2 infection and should not be used as the sole basis for treatment or other patient management decisions, including infection  control decisions, particularly in the presence of clinical signs and  symptoms consistent with COVID-19, or in those who have been in contact with the virus.  Negative results must be combined with clinical observations, patient history, and epidemiological information. The expected result is Negative.  Fact Sheet for Patients: HandmadeRecipes.com.cy  Fact Sheet for Healthcare Providers: FuneralLife.at  This test is not yet approved or cleared by the [place] FDA and  has been authorized for detection and/or diagnosis of SARS-CoV-2 by FDA under an Emergency Use Authorization (EUA).  This EUA will remain in effect (meaning this test can be used) for the duration of  the COV                          ID-19 declaration under Section 564(b)(1) of the Act, 21 U.S.C. section 360bbb-3(b)(1), unless the authorization is terminated or revoked sooner.     Cholesterol 11/24/2021 149  0 - 200 mg/dL Final   Triglycerides 11/24/2021 81  <150 mg/dL Final   HDL 11/24/2021 79  >40 mg/dL Final   Total CHOL/HDL Ratio 11/24/2021 1.9  RATIO Final   VLDL 11/24/2021 16  0 - 40 mg/dL Final   LDL Cholesterol 11/24/2021 54  0 - 99 mg/dL Final   Comment:        Total Cholesterol/HDL:CHD Risk Coronary Heart Disease Risk Table                     Men   Women  1/2 Average Risk   3.4   3.3  Average Risk       5.0   4.4  2 X Average Risk   9.6   7.1  3 X Average Risk  23.4   11.0         Use the calculated Patient Ratio above and the CHD Risk Table to determine the patient's CHD Risk.  ATP III CLASSIFICATION (LDL):  <100     mg/dL   Optimal  100-129  mg/dL   Near or Above                    Optimal  130-159  mg/dL   Borderline  160-189  mg/dL   High  >190     mg/dL   Very High Performed at [facility] [address] [place], [place] [address]   Admission on 10/23/2021, Discharged on 10/27/2021  Component Date Value Ref Range Status   WBC 10/23/2021 11.1 (H)  4.0 - 10.5 K/uL Final   RBC 10/23/2021 3.52 (L)  4.22 - 5.81 MIL/uL Final   Hemoglobin 10/23/2021 10.4 (L)  13.0 - 17.0 g/dL Final   HCT 10/23/2021 31.2 (L)  39.0 - 52.0 % Final   MCV 10/23/2021 88.6  80.0 - 100.0 fL Final   MCH 10/23/2021 29.5  26.0 - 34.0 pg Final   MCHC 10/23/2021 33.3  30.0 - 36.0 g/dL Final   RDW 10/23/2021 13.9  11.5 - 15.5 % Final   Platelets 10/23/2021 651 (H)  150 - 400 K/uL Final   nRBC 10/23/2021 0.0  0.0 - 0.2 % Final   Neutrophils Relative % 10/23/2021 58  % Final   Neutro Abs 10/23/2021 6.3  1.7 - 7.7 K/uL Final   Lymphocytes Relative 10/23/2021 27  % Final   Lymphs Abs 10/23/2021 3.0  0.7 - 4.0 K/uL Final   Monocytes Relative 10/23/2021 9  % Final   Monocytes Absolute 10/23/2021 1.0  0.1 - 1.0 K/uL Final   Eosinophils Relative 10/23/2021 5  % Final   Eosinophils Absolute 10/23/2021 0.6 (H)  0.0 - 0.5 K/uL Final   Basophils Relative 10/23/2021 1  % Final   Basophils Absolute 10/23/2021 0.1  0.0 - 0.1 K/uL Final   Immature Granulocytes 10/23/2021 0  % Final   Abs Immature Granulocytes 10/23/2021 0.03  0.00 - 0.07 K/uL Final   Performed at [facility], [place] [address], [place], [place] [address]   Sodium 10/23/2021 137  135 - 145 mmol/L Final   Potassium 10/23/2021 3.1 (L)  3.5 - 5.1 mmol/L Final   Chloride 10/23/2021 102  98 - 111 mmol/L Final   CO2 10/23/2021 26  22 - 32 mmol/L Final   Glucose, Bld 10/23/2021 96  70 - 99 mg/dL Final    Glucose reference range applies only to samples taken after fasting for at least 8 hours.   BUN 10/23/2021 5 (L)  6 - 20 mg/dL Final   Creatinine, Ser 10/23/2021 0.60 (L)  0.61 - 1.24 mg/dL Final   Calcium 10/23/2021 9.0  8.9 - 10.3 mg/dL Final   Total Protein 10/23/2021 7.8  6.5 - 8.1 g/dL Final   Albumin 10/23/2021 3.6  3.5 - 5.0 g/dL Final   AST 10/23/2021 29  15 - 41 U/L Final   ALT 10/23/2021 19  0 - 44 U/L Final   Alkaline Phosphatase 10/23/2021 76  38 - 126 U/L Final   Total Bilirubin 10/23/2021 0.5  0.3 - 1.2 mg/dL Final   GFR, Estimated 10/23/2021 >60  >60 mL/min Final   Comment: (NOTE) Calculated using the CKD-EPI Creatinine Equation (2021)    Anion gap 10/23/2021 9  5 - 15 Final   Performed at [facility], [place] [address], [place], [place] [address]   Lipase 10/23/2021 51  11 - 51 U/L Final   Performed at [facility], [place] [address] [place], [place] [address]   Color, Urine 10/24/2021 YELLOW  YELLOW Final   APPearance 10/24/2021 CLEAR  CLEAR Final   Specific Gravity, Urine 10/24/2021 >1.046 (H)  1.005 - 1.030 Final   pH 10/24/2021 5.0  5.0 - 8.0 Final   Glucose, UA 10/24/2021 NEGATIVE  NEGATIVE mg/dL Final   Hgb urine dipstick 10/24/2021 SMALL (A)  NEGATIVE Final   Bilirubin Urine 10/24/2021 NEGATIVE  NEGATIVE Final   Ketones, ur 10/24/2021 NEGATIVE  NEGATIVE mg/dL Final   Protein, ur 10/24/2021 NEGATIVE  NEGATIVE mg/dL Final   Nitrite 10/24/2021 NEGATIVE  NEGATIVE Final   Leukocytes,Ua 10/24/2021 NEGATIVE  NEGATIVE Final   RBC / HPF 10/24/2021 0-5  0 - 5 RBC/hpf Final   WBC, UA 10/24/2021 0-5  0 - 5 WBC/hpf Final   Bacteria, UA 10/24/2021 RARE (A)  NONE SEEN Final   Mucus 10/24/2021 PRESENT   Final   Performed at [facility], [place] [address], [place], [place] [address]   Sodium 10/23/2021 139  135 - 145 mmol/L Final   Potassium 10/23/2021 3.2 (L)  3.5 - 5.1 mmol/L Final   Chloride 10/23/2021 100  98 - 111  mmol/L Final   BUN 10/23/2021 <3 (L)  6 - 20 mg/dL Final   Creatinine, Ser 10/23/2021 0.50 (L)  0.61 - 1.24 mg/dL Final   Glucose, Bld 10/23/2021 96  70 - 99 mg/dL Final   Glucose reference range applies only to samples taken after fasting for at least 8 hours.   Calcium, Ion 10/23/2021 1.17  1.15 - 1.40 mmol/L Final   TCO2 10/23/2021 27  22 - 32 mmol/L Final   Hemoglobin 10/23/2021 10.9 (L)  13.0 - 17.0 g/dL Final   HCT 10/23/2021 32.0 (L)  39.0 - 52.0 % Final   SARS Coronavirus 2 by RT PCR 10/23/2021 POSITIVE (A)  NEGATIVE Final   Comment: RESULT CALLED TO, READ BACK BY AND VERIFIED WITH: JENNIFER SMITH [email or web address]  ON 10.26.22 BY TCALDWELL MT.    Influenza A by PCR 10/23/2021 NEGATIVE  NEGATIVE Final   Influenza B by PCR 10/23/2021 NEGATIVE  NEGATIVE Final   Performed at [facility] [address], [place], [place] [address]   Sodium 10/24/2021 137  135 - 145 mmol/L Final   Potassium 10/24/2021 3.2 (L)  3.5 - 5.1 mmol/L Final   Chloride 10/24/2021 100  98 - 111 mmol/L Final   CO2 10/24/2021 29  22 - 32 mmol/L Final   Glucose, Bld 10/24/2021 93  70 - 99 mg/dL Final   Glucose reference range applies only to samples taken after fasting for at least 8 hours.   BUN 10/24/2021 5 (L)  6 - 20 mg/dL Final   Creatinine, Ser 10/24/2021 0.72  0.61 - 1.24 mg/dL Final   Calcium 10/24/2021 8.7 (L)  8.9 - 10.3 mg/dL Final   GFR, Estimated 10/24/2021 >60  >60 mL/min Final   Comment: (NOTE) Calculated using the CKD-EPI Creatinine Equation (2021)    Anion gap 10/24/2021 8  5 - 15 Final   Performed at [facility], [place] [address], [place], [place] [address]   WBC 10/24/2021 9.2  4.0 - 10.5 K/uL Final   RBC 10/24/2021 3.27 (L)  4.22 - 5.81 MIL/uL Final   Hemoglobin 10/24/2021 9.7 (L)  13.0 - 17.0 g/dL Final   HCT 10/24/2021 30.0 (L)  39.0 - 52.0 % Final   MCV 10/24/2021 91.7  80.0 - 100.0 fL Final  MCH 10/24/2021 29.7  26.0 - 34.0 pg Final   MCHC 10/24/2021  32.3  30.0 - 36.0 g/dL Final   RDW 10/24/2021 14.1  11.5 - 15.5 % Final   Platelets 10/24/2021 557 (H)  150 - 400 K/uL Final   nRBC 10/24/2021 0.0  0.0 - 0.2 % Final   Performed at [facility], [place] [address], [place], [place] [address]   Magnesium 10/24/2021 1.7  1.7 - 2.4 mg/dL Final   Performed at [facility] [address], [place], [place] [address]   WBC 10/25/2021 8.4  4.0 - 10.5 K/uL Final   RBC 10/25/2021 3.23 (L)  4.22 - 5.81 MIL/uL Final   Hemoglobin 10/25/2021 9.4 (L)  13.0 - 17.0 g/dL Final   HCT 10/25/2021 29.5 (L)  39.0 - 52.0 % Final   MCV 10/25/2021 91.3  80.0 - 100.0 fL Final   MCH 10/25/2021 29.1  26.0 - 34.0 pg Final   MCHC 10/25/2021 31.9  30.0 - 36.0 g/dL Final   RDW 10/25/2021 14.2  11.5 - 15.5 % Final   Platelets 10/25/2021 416 (H)  150 - 400 K/uL Final   nRBC 10/25/2021 0.0  0.0 - 0.2 % Final   Performed at [facility], [place] [address] [place], [place] [address]   Sodium 10/25/2021 137  135 - 145 mmol/L Final   Potassium 10/25/2021 3.9  3.5 - 5.1 mmol/L Final   DELTA CHECK NOTED   Chloride 10/25/2021 106  98 - 111 mmol/L Final   CO2 10/25/2021 24  22 - 32 mmol/L Final   Glucose, Bld 10/25/2021 85  70 - 99 mg/dL Final   Glucose reference range applies only to samples taken after fasting for at least 8 hours.   BUN 10/25/2021 6  6 - 20 mg/dL Final   Creatinine, Ser 10/25/2021 0.68  0.61 - 1.24 mg/dL Final   Calcium 10/25/2021 8.4 (L)  8.9 - 10.3 mg/dL Final   GFR, Estimated 10/25/2021 >60  >60 mL/min Final   Comment: (NOTE) Calculated using the CKD-EPI Creatinine Equation (2021)    Anion gap 10/25/2021 7  5 - 15 Final   Performed at [facility], [place] [address] [place], [place] [address]   Magnesium 10/25/2021 1.8  1.7 - 2.4 mg/dL Final   Performed at [facility] [address] [place], [place] [address]   Sodium 10/26/2021 134 (L)  135 - 145 mmol/L Final    Potassium 10/26/2021 3.5  3.5 - 5.1 mmol/L Final   Chloride 10/26/2021 105  98 - 111 mmol/L Final   CO2 10/26/2021 22  22 - 32 mmol/L Final   Glucose, Bld 10/26/2021 106 (H)  70 - 99 mg/dL Final   Glucose reference range applies only to samples taken after fasting for at least 8 hours.   BUN 10/26/2021 5 (L)  6 - 20 mg/dL Final   Creatinine, Ser 10/26/2021 0.63  0.61 - 1.24 mg/dL Final   Calcium 10/26/2021 8.2 (L)  8.9 - 10.3 mg/dL Final   GFR, Estimated 10/26/2021 >60  >60 mL/min Final   Comment: (NOTE) Calculated using the CKD-EPI Creatinine Equation (2021)    Anion gap 10/26/2021 7  5 - 15 Final   Performed at [facility], [place] [address], [place], [place] [address]   WBC 10/26/2021 7.3  4.0 - 10.5 K/uL Final   RBC 10/26/2021 3.16 (L)  4.22 - 5.81 MIL/uL Final   Hemoglobin 10/26/2021 9.3 (  L)  13.0 - 17.0 g/dL Final   HCT 10/26/2021 28.9 (L)  39.0 - 52.0 % Final   MCV 10/26/2021 91.5  80.0 - 100.0 fL Final   MCH 10/26/2021 29.4  26.0 - 34.0 pg Final   MCHC 10/26/2021 32.2  30.0 - 36.0 g/dL Final   RDW 10/26/2021 14.0  11.5 - 15.5 % Final   Platelets 10/26/2021 331  150 - 400 K/uL Final   nRBC 10/26/2021 0.0  0.0 - 0.2 % Final   Performed at [facility], [place] [address], [place], [place] [address]  No results displayed because visit has over 200 results.    Admission on 09/16/2021, Discharged on 09/17/2021  Component Date Value Ref Range Status   B Natriuretic Peptide 09/16/2021 8.8  0.0 - 100.0 pg/mL Final   Performed at [facility], [place] [address], [place], [place] [address]   Sodium 09/16/2021 138  135 - 145 mmol/L Final   Potassium 09/16/2021 2.9 (L)  3.5 - 5.1 mmol/L Final   Chloride 09/16/2021 97 (L)  98 - 111 mmol/L Final   CO2 09/16/2021 31  22 - 32 mmol/L Final   Glucose, Bld 09/16/2021 92  70 - 99 mg/dL Final   Glucose reference range applies only to samples taken after fasting for at least 8 hours.   BUN  09/16/2021 10  6 - 20 mg/dL Final   Creatinine, Ser 09/16/2021 0.61  0.61 - 1.24 mg/dL Final   Calcium 09/16/2021 9.2  8.9 - 10.3 mg/dL Final   Total Protein 09/16/2021 7.6  6.5 - 8.1 g/dL Final   Albumin 09/16/2021 4.0  3.5 - 5.0 g/dL Final   AST 09/16/2021 35  15 - 41 U/L Final   ALT 09/16/2021 16  0 - 44 U/L Final   Alkaline Phosphatase 09/16/2021 73  38 - 126 U/L Final   Total Bilirubin 09/16/2021 0.8  0.3 - 1.2 mg/dL Final   GFR, Estimated 09/16/2021 >60  >60 mL/min Final   Comment: (NOTE) Calculated using the CKD-EPI Creatinine Equation (2021)    Anion gap 09/16/2021 10  5 - 15 Final   Performed at [facility], [place] [address], [place], [place] [address]   WBC 09/16/2021 9.1  4.0 - 10.5 K/uL Final   RBC 09/16/2021 5.13  4.22 - 5.81 MIL/uL Final   Hemoglobin 09/16/2021 15.5  13.0 - 17.0 g/dL Final   HCT 09/16/2021 45.1  39.0 - 52.0 % Final   MCV 09/16/2021 87.9  80.0 - 100.0 fL Final   MCH 09/16/2021 30.2  26.0 - 34.0 pg Final   MCHC 09/16/2021 34.4  30.0 - 36.0 g/dL Final   RDW 09/16/2021 12.9  11.5 - 15.5 % Final   Platelets 09/16/2021 246  150 - 400 K/uL Final   nRBC 09/16/2021 0.0  0.0 - 0.2 % Final   Neutrophils Relative % 09/16/2021 47  % Final   Neutro Abs 09/16/2021 4.4  1.7 - 7.7 K/uL Final   Lymphocytes Relative 09/16/2021 40  % Final   Lymphs Abs 09/16/2021 3.6  0.7 - 4.0 K/uL Final   Monocytes Relative 09/16/2021 9  % Final   Monocytes Absolute 09/16/2021 0.8  0.1 - 1.0 K/uL Final   Eosinophils Relative 09/16/2021 3  % Final   Eosinophils Absolute 09/16/2021 0.2  0.0 - 0.5 K/uL Final   Basophils Relative 09/16/2021 1  % Final   Basophils Absolute 09/16/2021 0.1  0.0 - 0.1 K/uL Final   Immature  Granulocytes 09/16/2021 0  % Final   Abs Immature Granulocytes 09/16/2021 0.02  0.00 - 0.07 K/uL Final   Performed at [facility], [place] [address] [place], [place] [address]   Magnesium 09/16/2021 1.9  1.7 - 2.4 mg/dL Final    Performed at [place] [address] [place], [place] [address]   Troponin I (High Sensitivity) 09/16/2021 6  <18 ng/L Final   Comment: (NOTE) Elevated high sensitivity troponin I (hsTnI) values and significant  changes across serial measurements may suggest ACS but many other  chronic and acute conditions are known to elevate hsTnI results.  Refer to the "Links" section for chest pain algorithms and additional  guidance. Performed at [facility], [place] [address], [place], [place] [address]    SARS Coronavirus 2 by RT PCR 09/16/2021 NEGATIVE  NEGATIVE Final   Comment: (NOTE) SARS-CoV-2 target nucleic acids are NOT DETECTED.  The SARS-CoV-2 RNA is generally detectable in upper respiratory specimens during the acute phase of infection. The lowest concentration of SARS-CoV-2 viral copies this assay can detect is 138 copies/mL. A negative result does not preclude SARS-Cov-2 infection and should not be used as the sole basis for treatment or other patient management decisions. A negative result may occur with  improper specimen collection/handling, submission of specimen other than nasopharyngeal swab, presence of viral mutation(s) within the areas targeted by this assay, and inadequate number of viral copies(<138 copies/mL). A negative result must be combined with clinical observations, patient history, and epidemiological information. The expected result is Negative.  Fact Sheet for Patients:  EntrepreneurPulse.com.au  Fact Sheet for Healthcare Providers:  IncredibleEmployment.be  This test is no                          t yet approved or cleared by the [place] FDA and  has been authorized for detection and/or diagnosis of SARS-CoV-2 by FDA under an Emergency Use Authorization (EUA). This EUA will remain  in effect (meaning this test can be used) for the duration of the COVID-19 declaration under  Section 564(b)(1) of the Act, 21 U.S.C.section 360bbb-3(b)(1), unless the authorization is terminated  or revoked sooner.       Influenza A by PCR 09/16/2021 NEGATIVE  NEGATIVE Final   Influenza B by PCR 09/16/2021 NEGATIVE  NEGATIVE Final   Comment: (NOTE) The Xpert Xpress SARS-CoV-2/FLU/RSV plus assay is intended as an aid in the diagnosis of influenza from Nasopharyngeal swab specimens and should not be used as a sole basis for treatment. Nasal washings and aspirates are unacceptable for Xpert Xpress SARS-CoV-2/FLU/RSV testing.  Fact Sheet for Patients: EntrepreneurPulse.com.au  Fact Sheet for Healthcare Providers: IncredibleEmployment.be  This test is not yet approved or cleared by the [place] FDA and has been authorized for detection and/or diagnosis of SARS-CoV-2 by FDA under an Emergency Use Authorization (EUA). This EUA will remain in effect (meaning this test can be used) for the duration of the COVID-19 declaration under Section 564(b)(1) of the Act, 21 U.S.C. section 360bbb-3(b)(1), unless the authorization is terminated or revoked.  Performed at [facility], [place] [address], [place], [place] [address]    D-Dimer, Quant 09/16/2021 0.70 (H)  0.00 - 0.50 ug/mL-FEU Final   Comment: (NOTE) At the manufacturer cut-off value of 0.5 g/mL FEU, this assay has a negative predictive value of 95-100%.This assay is intended for use in conjunction with a clinical pretest probability (PTP) assessment model  to exclude pulmonary embolism (PE) and deep venous thrombosis (DVT) in outpatients suspected of PE or DVT. Results should be correlated with clinical presentation. Performed at [facility], [place] [address], [place], [place] [address]    Troponin I (High Sensitivity) 09/16/2021 5  <18 ng/L Final   Comment: (NOTE) Elevated high sensitivity troponin I (hsTnI) values and significant  changes across  serial measurements may suggest ACS but many other  chronic and acute conditions are known to elevate hsTnI results.  Refer to the "Links" section for chest pain algorithms and additional  guidance. Performed at [facility], [place] [address], [place], [place] [address]     Blood Alcohol level:  Lab Results  Component Value Date   ETH 77 (H) 50/27/7412    Metabolic Disorder Labs: Lab Results  Component Value Date   HGBA1C 4.4 (L) 11/24/2021   MPG 80 11/24/2021   No results found for: PROLACTIN Lab Results  Component Value Date   CHOL 149 11/24/2021   TRIG 81 11/24/2021   HDL 79 11/24/2021   CHOLHDL 1.9 11/24/2021   VLDL 16 11/24/2021   LDLCALC 54 11/24/2021    Therapeutic Lab Levels: No results found for: LITHIUM No results found for: VALPROATE No components found for:  CBMZ  Physical Findings   Flowsheet Row ED from 11/24/2021 in [facility] ED to Hosp-Admission (Discharged) from 10/23/2021 in [facility] ED to Hosp-Admission (Discharged) from 10/04/2021 in [facility] [place] [facility]  C-SSRS RISK CATEGORY High Risk No Risk No Risk        Musculoskeletal  Strength & Muscle Tone: within normal limits Gait & Station: normal Patient leans: N/A  Psychiatric Specialty Exam  Presentation  General Appearance: Appropriate for Environment; Casual  Eye Contact:Good  Speech:Clear and Coherent; Normal Rate  Speech Volume:Normal  Handedness:Right   Mood and Affect  Mood:-- ("ok")  Affect:Appropriate; Congruent   Thought Process  Thought Processes:Coherent; Goal Directed; Linear  Descriptions of Associations:Intact  Orientation:Full (Time, Place and Person)  Thought Content:WDL; Logical  Diagnosis of Schizophrenia or Schizoaffective disorder in past: No    Hallucinations:Hallucinations: None  Ideas of Reference:None  Suicidal Thoughts:Suicidal Thoughts: No  Homicidal Thoughts:Homicidal  Thoughts: No   Sensorium  Memory:Immediate Good; Recent Good; Remote Good  Judgment:Fair  Insight:Fair   Executive Functions  Concentration:Good  Attention Span:Good  Rooks of Knowledge:Good  Language:Good   Psychomotor Activity  Psychomotor Activity:Psychomotor Activity: Normal   Assets  Assets:Communication Skills; Desire for Improvement; Resilience; Social Support   Sleep  Sleep:Sleep: Fair   No data recorded   Physical Exam  Physical Exam Constitutional:      Appearance: Normal appearance.  HENT:     Head: Normocephalic.     Nose: Nose normal.  Eyes:     Conjunctiva/sclera: Conjunctivae normal.  Pulmonary:     Effort: Pulmonary effort is normal.  Musculoskeletal:        General: Normal range of motion.     Cervical back: Normal range of motion.  Neurological:     Mental Status: He is alert and oriented to person, place, and time.   Review of Systems  Constitutional: Negative.  Negative for fever.  HENT: Negative.    Eyes: Negative.  Negative for blurred vision.  Respiratory: Negative.    Cardiovascular: Negative.  Negative for chest pain, palpitations and leg swelling.  Gastrointestinal:  Positive for abdominal pain.       Mild pain where  a drain was during a previous surgery  Genitourinary: Negative.   Musculoskeletal: Negative.   Skin: Negative.   Neurological:  Positive for headaches.  Endo/Heme/Allergies: Negative.   Psychiatric/Behavioral:  Positive for depression. Negative for hallucinations. The patient is nervous/anxious.   Blood pressure 124/90, pulse 97, temperature 97.8 F (36.6 C), temperature source Tympanic, resp. rate 18, SpO2 99 %. There is no height or weight on file to calculate BMI.  Treatment Plan Summary: 45 year old male with history of severe MDD with psychosis who presented to the [facility] on 11/26 for SI in the context of numerous life stressors-being diagnosed with cancer approximately 1 year ago, recently  being hospitalized for appendicitis, marital conflict, financial difficulties and recent homelessness.  Patient was admitted to the [facility] for further treatment.    Today patient denies SI/HI/AVH and describes improvement in mood.  Patient is focused on homelessness and where he will be staying once he is discharged from the [facility].  Social work Sports administrator.  Patient remains appropriate for the [facility] for continued treatment.  MDD, severe -Increase Effexor 75 mg po daily to 150 mg (11/29) from 75 mg (started 11/26) -adjust risperdal from 0.5 mg qhs to 0.5 mg qam/1 mg qhs as adjunct for mood (11/28) -PRN trazodone 50 mg qhs  -stop remeron 7.5 mg-received 1 time dose on 11/27. Discontinued in an effort to decrease polypharmacy.  On clarification of decreased appetite it appears that patient has reported decreased appetite while at the [facility] due to not receiving hot meals-does not appear to be related to mood disorder  Alcohol use -continue gabapentin 300 mg BID for alcohol crabings (started 11/27)  HTN Increase lisinopril 5 mg po daily to 10 mg daily (11/29) Care instruction order ordered to check b/p manually   Nicotine dependence -nicoderm cq patch 21 mg -nicotine gum  Asthma -patient reports history of asthma as a child -PRN albuterol  Dispo:  ongoing. SW assisting, patient with interest in discharging to rescue mission or possible sober living.  Ival Bible, MD 11/27/2021 4:34 PM

## 2021-11-27 NOTE — ED Notes (Signed)
Patient is now awake and alert on unit and preparing to eat breakfast.  No complaints or distress.  No evidence of withdrawal symptoms.

## 2021-11-27 NOTE — ED Notes (Signed)
Pt watching tv in no acute distress. Finished lunch. Denies concerns. Safety maintained.

## 2021-11-27 NOTE — ED Notes (Signed)
Pt is sleeping in the bed at present. Respirations are even and unlabored. No acute distress noted. Will continue to monitor for safety.

## 2021-11-28 ENCOUNTER — Encounter: Payer: Self-pay | Admitting: *Deleted

## 2021-11-28 ENCOUNTER — Other Ambulatory Visit: Payer: Self-pay

## 2021-11-28 DIAGNOSIS — F1090 Alcohol use, unspecified, uncomplicated: Secondary | ICD-10-CM | POA: Diagnosis not present

## 2021-11-28 DIAGNOSIS — Z20822 Contact with and (suspected) exposure to covid-19: Secondary | ICD-10-CM | POA: Diagnosis not present

## 2021-11-28 DIAGNOSIS — F332 Major depressive disorder, recurrent severe without psychotic features: Secondary | ICD-10-CM | POA: Diagnosis not present

## 2021-11-28 DIAGNOSIS — R45851 Suicidal ideations: Secondary | ICD-10-CM | POA: Diagnosis not present

## 2021-11-28 MED ORDER — ALBUTEROL SULFATE HFA 108 (90 BASE) MCG/ACT IN AERS
2.0000 | INHALATION_SPRAY | RESPIRATORY_TRACT | 0 refills | Status: DC | PRN
  Filled 2021-12-04: qty 8.5, 25d supply, fill #0

## 2021-11-28 MED ORDER — NICOTINE 21 MG/24HR TD PT24: 21.0000 mg | MEDICATED_PATCH | Freq: Every day | TRANSDERMAL | 0 refills | Status: DC

## 2021-11-28 MED ORDER — TRAZODONE HCL 50 MG PO TABS: 50.0000 mg | ORAL_TABLET | Freq: Every evening | ORAL | 0 refills | Status: DC | PRN

## 2021-11-28 MED ORDER — GABAPENTIN 300 MG PO CAPS: 300.0000 mg | ORAL_CAPSULE | Freq: Two times a day (BID) | ORAL | 0 refills | Status: DC

## 2021-11-28 MED ORDER — VENLAFAXINE HCL ER 150 MG PO CP24
150.0000 mg | ORAL_CAPSULE | Freq: Every day | ORAL | 0 refills | Status: DC
Start: 2021-11-29 — End: 2021-12-04

## 2021-11-28 MED ORDER — LISINOPRIL 10 MG PO TABS
10.0000 mg | ORAL_TABLET | Freq: Every day | ORAL | 0 refills | Status: DC
  Filled 2021-12-04: qty 30, 30d supply, fill #0

## 2021-11-28 MED ORDER — NICOTINE POLACRILEX 2 MG MT GUM: 2.0000 mg | CHEWING_GUM | OROMUCOSAL | 0 refills | Status: DC | PRN

## 2021-11-28 MED ORDER — ADULT MULTIVITAMIN W/MINERALS CH: 1.0000 | ORAL_TABLET | Freq: Every day | ORAL | 0 refills | Status: DC

## 2021-11-28 MED ORDER — RISPERIDONE 0.5 MG PO TABS: 0.5000 mg | ORAL_TABLET | Freq: Every day | ORAL | 0 refills | Status: DC

## 2021-11-28 MED ORDER — THIAMINE HCL 100 MG PO TABS: 100.0000 mg | ORAL_TABLET | Freq: Every day | ORAL | 0 refills | Status: DC

## 2021-11-28 MED ORDER — RISPERIDONE 1 MG PO TABS: 1.0000 mg | ORAL_TABLET | Freq: Every day | ORAL | 0 refills | Status: DC

## 2021-11-28 MED ORDER — HYDROXYZINE HCL 25 MG PO TABS: 25.0000 mg | ORAL_TABLET | Freq: Three times a day (TID) | ORAL | 0 refills | Status: DC | PRN

## 2021-11-28 NOTE — ED Provider Notes (Signed)
FBC/OBS ASAP Discharge Summary  Date and Time: 11/28/2021 11:10 AM  Name: Joshua Cooper  MRN:  [id number]   Discharge Diagnoses:  Final diagnoses:  Severe episode of recurrent major depressive disorder, without psychotic features ([place])  Suicidal ideation    Subjective:  Patient seen and chart reviewed.  I was informed prior to interview that patient was requesting discharge.  Patient has been medication compliant, has been appropriate with staff and peers on the unit has been attending groups.  Patient interviewed in his room this morning.  Patient describes his mood as "a lot better".  Patient's affect is notably brighter.  Patient reports that he continues to wake up a couple times a night however indicates that overall sleep has improved.  Patient states that he would like to go to the [facility] upon discharge.  He denies SI/HI/AVH.  Discussed with medication that samples were provided as well as 30-day scripts.  Discussed open access hours with patient and he is agreeable to present tomorrow to establish care.  Also discussed [place] and wellness as an option to follow up for primary care regarding his elevated blood pressure.  Patient verbalized understanding and was in agreement.  Patient expresses gratitude for care is received at the [facility].  Patient has no physical complaints this morning and requests a for transport to the [facility]  Stay Summary:  45 year old male with history of severe MDD with psychosis who presented to the [facility] on 11/26 for SI in the context of numerous life stressors-being diagnosed with cancer approximately 1 year ago, recently being hospitalized for appendicitis, marital conflict, financial difficulties and recent homelessness.  Patient was admitted to the [facility] for further treatment.  Patient was restarted on Effexor 75 mg p.o. daily on admission-this was patient's home medication although had not taken in 6 days (home dose of 150 mg).  On 11/29 Effexor was increased 150 mg  daily-patient tolerated well.  Patient was started on Risperdal 0.5 mg on 11/27 . Patient continued to report poor mood and racing thoughts and this was increased to 0.5 mg every morning and 1 mg nightly on 11/28.  Patient tolerated medication adjustment well.  Patient did receive a one-time dose of Remeron 7.5 mg 11/27 for difficulty sleeping.  Patient was started on gabapentin 300 mg twice daily for alcohol cravings in 11/29.  Patient was started on lisinopril 5 mg on admission due to elevated blood pressure this was increased to 10 mg daily on 11/29.  Patient was started on nicotine patch at time of admission to assist with nicotine cravings and albuterol inhaler as needed was ordered for control of symptoms.  Patient was appropriate with staff and peers throughout his stay and was calm and cooperative.  Patient's mood improved by day of discharged and he denied SI/HI/AVH and expressed readiness for discharge.  He was future oriented and was focused on going to the [facility] and filling out his Medicaid application.  Discussed follow-up with patient as above.  Patient was deemed appropriate for discharge as he was no longer experiencing suicidal thoughts.  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 No pertinent family history. Family Psychiatric History: mother with alcoholism Social History:  Social History   Substance and Sexual Activity  Alcohol Use Yes   Alcohol/week: 2.0 standard drinks   Types: 2 Cans of beer per week     Social History   Substance and  Sexual Activity  Drug Use Not Currently    Social History   Socioeconomic History   Marital status: Married    Spouse name: Not on file   Number of children: Not on file   Years of education: Not on file   Highest education level: Not on file  Occupational History   Not on file  Tobacco Use   Smoking status: Some Days    Types: Cigarettes   Smokeless tobacco: Never  Substance and Sexual Activity   Alcohol use: Yes    Alcohol/week: 2.0 standard drinks    Types: 2 Cans of beer per week   Drug use: Not Currently   Sexual activity: Not on file  Other Topics Concern   Not on file  Social History Narrative   Not on file   Social Determinants of Health   Financial Resource Strain: Not on file  Food Insecurity: Not on file  Transportation Needs: Not on file  Physical Activity: Not on file  Stress: Not on file  Social Connections: Not on file   SDOH:  SDOH Screenings   Alcohol Screen: Not on file  Depression (PHQ2-9): Not on file  Financial Resource Strain: Not on file  Food Insecurity: Not on file  Housing: Not on file  Physical Activity: Not on file  Social Connections: Not on file  Stress: Not on file  Tobacco Use: High Risk   Smoking Tobacco Use: Some Days   Smokeless Tobacco Use: Never   Passive Exposure: Not on file  Transportation Needs: Not on file    Tobacco Cessation:  A prescription for an FDA-approved tobacco cessation medication provided at discharge  Current Medications:  Current Facility-Administered Medications  Medication Dose Route Frequency Provider Last Rate Last Admin   acetaminophen (TYLENOL) tablet 650 mg  650 mg Oral Q6H PRN Nwoko, Uchenna E, PA   650 mg at 11/28/21 0855   albuterol (VENTOLIN HFA) 108 (90 Base) MCG/ACT inhaler 2 puff  2 puff Inhalation Q4H PRN Ival Bible, MD   2 puff at 11/28/21 0649   alum & mag hydroxide-simeth (MAALOX/MYLANTA) 200-200-20 MG/5ML suspension 30 mL  30 mL Oral Q4H PRN Nwoko, Uchenna E, PA        gabapentin (NEURONTIN) capsule 300 mg  300 mg Oral BID Bobbitt, Shalon E, NP   300 mg at 11/28/21 0855   hydrOXYzine (ATARAX/VISTARIL) tablet 25 mg  25 mg Oral TID PRN Nwoko, Uchenna E, PA   25 mg at 11/28/21 0649   lisinopril (ZESTRIL) tablet 10 mg  10 mg Oral Daily Ival Bible, MD   10 mg at 11/28/21 0855   magnesium hydroxide (MILK OF MAGNESIA) suspension 30 mL  30 mL Oral Daily PRN Nwoko, Uchenna E, PA       multivitamin with minerals tablet 1 tablet  1 tablet Oral Daily White, Patrice L, NP   1 tablet at 11/28/21 0855   nicotine (NICODERM CQ - dosed in mg/24 hours) patch 21 mg  21 mg Transdermal Daily White, Patrice L, NP   21 mg at 11/28/21 0859   nicotine polacrilex (NICORETTE) gum 2 mg  2 mg Oral PRN  Ival Bible, MD   2 mg at 11/28/21 0919   risperiDONE (RISPERDAL) tablet 0.5 mg  0.5 mg Oral Daily Ival Bible, MD   0.5 mg at 11/28/21 2330   And   risperiDONE (RISPERDAL) tablet 1 mg  1 mg Oral QHS Ival Bible, MD   1 mg at 11/27/21 2112   sodium chloride (OCEAN) 0.65 % nasal spray 1 spray  1 spray Each Nare PRN Lindon Romp A, NP   1 spray at 11/27/21 2242   thiamine tablet 100 mg  100 mg Oral Daily White, Patrice L, NP   100 mg at 11/28/21 0855   traZODone (DESYREL) tablet 50 mg  50 mg Oral QHS PRN Ival Bible, MD   50 mg at 11/27/21 2112   venlafaxine XR (EFFEXOR-XR) 24 hr capsule 150 mg  150 mg Oral Q breakfast Ival Bible, MD   150 mg at 11/28/21 0762   Current Outpatient Medications  Medication Sig Dispense Refill   albuterol (VENTOLIN HFA) 108 (90 Base) MCG/ACT inhaler Inhale 2 puffs into the lungs every 4 (four) hours as needed for wheezing or shortness of breath. 9 g 0   gabapentin (NEURONTIN) 300 MG capsule Take 1 capsule (300 mg total) by mouth 2 (two) times daily. 60 capsule 0   hydrOXYzine (ATARAX) 25 MG tablet Take 1 tablet (25 mg total) by mouth 3 (three) times daily as needed for anxiety. 90 tablet 0   [START ON  11/29/2021] lisinopril (ZESTRIL) 10 MG tablet Take 1 tablet (10 mg total) by mouth daily. 30 tablet 0   [START ON 11/29/2021] Multiple Vitamin (MULTIVITAMIN WITH MINERALS) TABS tablet Take 1 tablet by mouth daily. 30 tablet 0   [START ON 11/29/2021] nicotine (NICODERM CQ - DOSED IN MG/24 HOURS) 21 mg/24hr patch Place 1 patch (21 mg total) onto the skin daily. 28 patch 0   nicotine polacrilex (NICORETTE) 2 MG gum Take 1 each (2 mg total) by mouth as needed for smoking cessation. 100 tablet 0   [START ON 11/29/2021] risperiDONE (RISPERDAL) 0.5 MG tablet Take 1 tablet (0.5 mg total) by mouth daily. 30 tablet 0   risperiDONE (RISPERDAL) 1 MG tablet Take 1 tablet (1 mg total) by mouth at bedtime. 30 tablet 0   [START ON 11/29/2021] thiamine 100 MG tablet Take 1 tablet (100 mg total) by mouth daily. 30 tablet 0   traZODone (DESYREL) 50 MG tablet Take 1 tablet (50 mg total) by mouth at bedtime as needed for sleep. 30 tablet 0   [START ON 11/29/2021] venlafaxine XR (EFFEXOR-XR) 150 MG 24 hr capsule Take 1 capsule (150 mg total) by mouth daily with breakfast. 30 capsule 0    PTA Medications: (Not in a hospital admission)   Musculoskeletal  Strength & Muscle Tone: within normal limits Gait & Station: normal Patient leans: N/A  Psychiatric Specialty Exam  Presentation  General Appearance: Appropriate for Environment; Casual  Eye Contact:Good  Speech:Clear and Coherent; Normal Rate  Speech Volume:Normal  Handedness:Right   Mood and Affect  Mood:-- ("ok")  Affect:Appropriate; Congruent   Thought Process  Thought Processes:Coherent; Goal Directed; Linear  Descriptions of Associations:Intact  Orientation:Full (Time, Place and Person)  Thought Content:WDL; Logical  Diagnosis of Schizophrenia or Schizoaffective disorder in past: No    Hallucinations:Hallucinations: None  Ideas of Reference:None  Suicidal Thoughts:Suicidal Thoughts: No  Homicidal Thoughts:Homicidal Thoughts:  No   Sensorium  Memory:Immediate Good; Recent Good; Remote Good  Judgment:Fair  Insight:Fair   Community education officer  Concentration:Good  Attention Span:Good  Recall:Good  Fund of Knowledge:Good  Language:Good   Psychomotor Activity  Psychomotor Activity:Psychomotor Activity: Normal   Assets  Assets:Communication Skills; Desire for Improvement; Resilience; Social Support   Sleep  Sleep:Sleep: Fair   No data recorded  Physical Exam  Physical Exam Constitutional:      Appearance: Normal appearance. He is normal weight.  HENT:     Head: Normocephalic and atraumatic.  Eyes:     Extraocular Movements: Extraocular movements intact.  Pulmonary:     Effort: Pulmonary effort is normal.  Neurological:     General: No focal deficit present.     Mental Status: He is alert and oriented to person, place, and time.  Psychiatric:        Attention and Perception: Attention and perception normal.        Speech: Speech normal.        Behavior: Behavior normal. Behavior is cooperative.        Thought Content: Thought content normal.   ROS Blood pressure 110/86, pulse 98, temperature 97.8 F (36.6 C), temperature source Oral, resp. rate 18, SpO2 98 %. There is no height or weight on file to calculate BMI.  Demographic Factors:  Male, Caucasian, Low socioeconomic status, and Unemployed  Loss Factors: Decrease in vocational status, Loss of significant relationship, and Financial problems/change in socioeconomic status  Historical Factors: Family history of mental illness or substance abuse  Risk Reduction Factors:   Responsible for children under 59 years of age, Sense of responsibility to family, and Positive coping skills or problem solving skills  Continued Clinical Symptoms:  Depression:   Comorbid alcohol abuse/dependence Alcohol/Substance Abuse/Dependencies  Cognitive Features That Contribute To Risk:  None    Suicide Risk:  Minimal: No identifiable  suicidal ideation.  Patients presenting with no risk factors but with morbid ruminations; may be classified as minimal risk based on the severity of the depressive symptoms  Plan Of Care/Follow-up recommendations:  Activity:  as tolerated Diet:  regular Other:     Patient is instructed prior to discharge to: Take all medications as prescribed by his/her mental healthcare provider. Report any adverse effects and or reactions from the medicines to his/her outpatient provider promptly. Patient has been instructed & cautioned: To not engage in alcohol and or illegal drug use while on prescription medicines. In the event of worsening symptoms, patient is instructed to call the crisis hotline, 911 and or go to the nearest ED for appropriate evaluation and treatment of symptoms. To follow-up with his/her primary care provider for your other medical issues, concerns and or health care needs.      Please come to [facility] (this facility) during walk in hours for appointment with psychiatrist for further medication management and for therapists for therapy.    Walk in hours are 8-11 AM Monday through Thursday for medication management..  Therapy walk in hours are Monday-Wednesday 8 AM-1PM.   It is first come, first -serve; it is best to arrive by 7:00 AM.   On Friday from 1 pm to 4 pm for therapy intake only. Please arrive by 12:00 pm as it is  first come, first -serve.    When you arrive please go upstairs for your appointment. If you are unsure of where to go, inform the front desk that you are here for a walk in appointment and they will assist you with directions upstairs.  Address:  [address], in [place], [place] Ph: [phone]  TAKE these medications    albuterol 108 (90 Base) MCG/ACT inhaler Commonly known as: VENTOLIN HFA Inhale 2 puffs into the lungs every 4 (four) hours as needed for wheezing or shortness of breath.   gabapentin 300 MG  capsule Commonly known as: NEURONTIN Take 1 capsule (300 mg total) by mouth 2 (two) times daily.   hydrOXYzine 25 MG tablet Commonly known as: ATARAX Take 1 tablet (25 mg total) by mouth 3 (three) times daily as needed for anxiety.   lisinopril 10 MG tablet Commonly known as: ZESTRIL Take 1 tablet (10 mg total) by mouth daily. Start taking on: November 29, 2021   multivitamin with minerals Tabs tablet Take 1 tablet by mouth daily. Start taking on: November 29, 2021   nicotine 21 mg/24hr patch Commonly known as: NICODERM CQ - dosed in mg/24 hours Place 1 patch (21 mg total) onto the skin daily. Start taking on: November 29, 2021   nicotine polacrilex 2 MG gum Commonly known as: NICORETTE Take 1 each (2 mg total) by mouth as needed for smoking cessation.   risperiDONE 1 MG tablet Commonly known as: RISPERDAL Take 1 tablet (1 mg total) by mouth at bedtime.   risperiDONE 0.5 MG tablet Commonly known as: RISPERDAL Take 1 tablet (0.5 mg total) by mouth daily. Start taking on: November 29, 2021   thiamine 100 MG tablet Take 1 tablet (100 mg total) by mouth daily. Start taking on: November 29, 2021   traZODone 50 MG tablet Commonly known as: DESYREL Take 1 tablet (50 mg total) by mouth at bedtime as needed for sleep.   venlafaxine XR 150 MG 24 hr capsule Commonly known as: EFFEXOR-XR Take 1 capsule (150 mg total) by mouth daily with breakfast. Start taking on: November 29, 2021 What changed: when to take this      Patient provided with 7 day samples all above medications.  Patient is also provided with 30-day scripts of above medications.  Recommended that patient follow up upstairs for a walk-in appointment (information for open access hours placed in AVS and discussed with patient) for continued medication prescriptions.  Disposition: self care  Ival Bible, MD 11/28/2021, 11:10 AM

## 2021-11-28 NOTE — ED Notes (Signed)
Morning vitals have been taken on patient

## 2021-11-28 NOTE — Discharge Instructions (Addendum)
Patient is instructed prior to discharge to: Take all medications as prescribed by his/her mental healthcare provider. Report any adverse effects and or reactions from the medicines to his/her outpatient provider promptly. Patient has been instructed & cautioned: To not engage in alcohol and or illegal drug use while on prescription medicines. In the event of worsening symptoms, patient is instructed to call the crisis hotline, 911 and or go to the nearest ED for appropriate evaluation and treatment of symptoms. To follow-up with his/her primary care provider for your other medical issues, concerns and or health care needs.     Please come to [facility] (this facility) during walk in hours for appointment with psychiatrist for further medication management and for therapists for therapy.    Walk in hours are 8-11 AM Monday through Thursday for medication management. Therapy walk in hours are Monday-Wednesday 8 AM-1PM.   It is first come, first -serve; it is best to arrive by 7:00 AM.   On Friday from 1 pm to 4 pm for therapy intake only. Please arrive by 12:00 pm as it is  first come, first -serve.    When you arrive please go upstairs for your appointment. If you are unsure of where to go, inform the front desk that you are here for a walk in appointment and they will assist you with directions upstairs.  Address:  [address] in [place], [place] Ph: [phone]

## 2021-11-28 NOTE — Progress Notes (Signed)
Pt is awake, alert and oriented. Pt complained of abdominal pain and requested Tylenol. PRN Tylenol and scheduled meds administered with no incident. No signs acute distress noted. Pt denies current SI/HI/AVH. 15 minutes checks is in effect. Staff will monitor for pt's safety.

## 2021-11-28 NOTE — ED Notes (Addendum)
error 

## 2021-11-28 NOTE — ED Notes (Addendum)
Pt came to nurses station and requested for his Vistaril, Inhaler and nicorette gum.

## 2021-11-28 NOTE — Congregational Nurse Program (Signed)
  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 Yes    Location Patient Served  [facility]    Visit Setting Church or Organization;Phone/Text/Email    Patient Status Homeless    Insurance Uninsured ([employer]/Self-Pay)    Insurance Referral N/A    Medication N/A   see note   Medical Provider No    Screening Referrals N/A    Medical Referral [facility]    Medical Appointment Made [facility] P[facility]    Food N/A    Transportation Provided transportation assistance    Housing/Utilities No permanent housing    Interpersonal Safety N/A    Intervention Support;Counsel    ED Visit Averted N/A    Life-Saving Intervention Made N/A            Client came into nurse's office after being discharged from [facility]. Client had discharge papers and paper prescriptions. [place] and made an appt Dec 15th with Dr. Joya Gaskins at 10:30. Left a message with pharmacy to call client back about getting his prescriptions filled. Referred to [facility] to get on a list for shelter. Client is in process of applying for medicaid. He is from [place] and has a green card that was recently replaced. Client has been completing job applications. Gave four bus passes to get to [facility] and walk in follow up at [facility]. He denies si and hi.  Marsella Suman W RN CN

## 2021-11-28 NOTE — Discharge Summary (Signed)
Joshua Cooper to be D/C'd Home per MD order. Discussed with the patient and all questions fully answered. An After Visit Summary was printed and given to the patient. Medication samples and scripts were also given to patient. Patient escorted out and D/C home via safe transport.  [name]  11/28/2021 11:41 AM

## 2021-11-28 NOTE — ED Notes (Signed)
Pt is currently sleeping, no distress noted, environmental check complete, will continue to monitor patient for safety. ? ?

## 2021-11-28 NOTE — Clinical Social Work Psych Note (Signed)
CSW Discharge Note   CSW met with [name] to discuss any updates and discharge. [name] shared that he was ready to discharge after feeling "stable". He denied having any SI, HI or AVH. [name] reports feeling "good"   [name] reports that he would like to discharge to the [facility] so that he can meet with a case worker, who could potentially help him secure alternative living arrangements.   [name] requested assistance with transportation at discharge.   [name] denied having any other questions or concerns at this time.  CSW provided outpatient resources for medication management and therapy services, as well as housing, financial and medical services.    CSW signing off.     Radonna Ricker, MSW, LCSW Clinical Education officer, museum ([place]) [facility]

## 2021-11-29 ENCOUNTER — Emergency Department (HOSPITAL_COMMUNITY): Payer: Self-pay

## 2021-11-29 ENCOUNTER — Emergency Department (HOSPITAL_COMMUNITY)
Admission: EM | Admit: 2021-11-29 | Discharge: 2021-11-29 | Disposition: A | Discharge status: Home / Self Care | Payer: Self-pay | Attending: Emergency Medicine | Admitting: Emergency Medicine

## 2021-11-29 ENCOUNTER — Other Ambulatory Visit: Payer: Self-pay

## 2021-11-29 DIAGNOSIS — R42 Dizziness and giddiness: Secondary | ICD-10-CM | POA: Insufficient documentation

## 2021-11-29 DIAGNOSIS — Z8547 Personal history of malignant neoplasm of testis: Secondary | ICD-10-CM | POA: Insufficient documentation

## 2021-11-29 DIAGNOSIS — R55 Syncope and collapse: Secondary | ICD-10-CM | POA: Insufficient documentation

## 2021-11-29 DIAGNOSIS — W07XXXA Fall from chair, initial encounter: Secondary | ICD-10-CM | POA: Insufficient documentation

## 2021-11-29 DIAGNOSIS — R569 Unspecified convulsions: Secondary | ICD-10-CM | POA: Insufficient documentation

## 2021-11-29 DIAGNOSIS — F17210 Nicotine dependence, cigarettes, uncomplicated: Secondary | ICD-10-CM | POA: Insufficient documentation

## 2021-11-29 LAB — CBC WITH DIFFERENTIAL/PLATELET
Abs Immature Granulocytes: 0.02 10*3/uL (ref 0.00–0.07)
Basophils Absolute: 0.1 10*3/uL (ref 0.0–0.1)
Basophils Relative: 1 %
Eosinophils Absolute: 0.2 10*3/uL (ref 0.0–0.5)
Eosinophils Relative: 3 %
HCT: 40.2 % (ref 39.0–52.0)
Hemoglobin: 13.2 g/dL (ref 13.0–17.0)
Immature Granulocytes: 0 %
Lymphocytes Relative: 36 %
Lymphs Abs: 2.3 10*3/uL (ref 0.7–4.0)
MCH: 31.8 pg (ref 26.0–34.0)
MCHC: 32.8 g/dL (ref 30.0–36.0)
MCV: 96.9 fL (ref 80.0–100.0)
Monocytes Absolute: 0.5 10*3/uL (ref 0.1–1.0)
Monocytes Relative: 8 %
Neutro Abs: 3.4 10*3/uL (ref 1.7–7.7)
Neutrophils Relative %: 52 %
Platelets: 284 10*3/uL (ref 150–400)
RBC: 4.15 MIL/uL — ABNORMAL LOW (ref 4.22–5.81)
RDW: 15 % (ref 11.5–15.5)
WBC: 6.4 10*3/uL (ref 4.0–10.5)
nRBC: 0 % (ref 0.0–0.2)

## 2021-11-29 LAB — COMPREHENSIVE METABOLIC PANEL
ALT: 45 U/L — ABNORMAL HIGH (ref 0–44)
AST: 89 U/L — ABNORMAL HIGH (ref 15–41)
Albumin: 3.5 g/dL (ref 3.5–5.0)
Alkaline Phosphatase: 74 U/L (ref 38–126)
Anion gap: 10 (ref 5–15)
BUN: 7 mg/dL (ref 6–20)
CO2: 22 mmol/L (ref 22–32)
Calcium: 9.2 mg/dL (ref 8.9–10.3)
Chloride: 102 mmol/L (ref 98–111)
Creatinine, Ser: 0.67 mg/dL (ref 0.61–1.24)
GFR, Estimated: >60 mL/min (ref >60)
Glucose, Bld: 116 mg/dL — ABNORMAL HIGH (ref 70–99)
Potassium: 4.1 mmol/L (ref 3.5–5.1)
Sodium: 134 mmol/L — ABNORMAL LOW (ref 135–145)
Total Bilirubin: 0.4 mg/dL (ref 0.3–1.2)
Total Protein: 6.9 g/dL (ref 6.5–8.1)

## 2021-11-29 LAB — ETHANOL: Alcohol, Ethyl (B): <10 mg/dL (ref <10)

## 2021-11-29 LAB — CBG MONITORING, ED: Glucose-Capillary: 130 mg/dL — ABNORMAL HIGH (ref 70–99)

## 2021-11-29 LAB — MAGNESIUM: Magnesium: 1.7 mg/dL (ref 1.7–2.4)

## 2021-11-29 MED ORDER — DIPHENHYDRAMINE HCL 50 MG/ML IJ SOLN
25.0000 mg | Freq: Once | INTRAMUSCULAR | Status: AC
  Administered 2021-11-29: 25 mg via INTRAVENOUS
  Filled 2021-11-29: qty 1

## 2021-11-29 MED ORDER — PROCHLORPERAZINE EDISYLATE 10 MG/2ML IJ SOLN
10.0000 mg | Freq: Once | INTRAMUSCULAR | Status: AC
  Administered 2021-11-29: 10 mg via INTRAVENOUS
  Filled 2021-11-29: qty 2

## 2021-11-29 MED ORDER — SODIUM CHLORIDE 0.9 % IV BOLUS
1000.0000 mL | Freq: Once | INTRAVENOUS | Status: AC
  Administered 2021-11-29: 1000 mL via INTRAVENOUS

## 2021-11-29 NOTE — ED Notes (Signed)
Patient transported to CT 

## 2021-11-29 NOTE — ED Triage Notes (Addendum)
Pt bib [facility] from the [facility] where pt had a witnessed "seizure" and fell out of the chair, hit the back of his head. Pt was "postictal" upon EMS arrival. Pt arrives AOx4 and VSS.  EMS vitals: 100/60, 90HR, 17R, 98%RA, 97CBG

## 2021-11-29 NOTE — ED Notes (Signed)
Pt alert and oriented x4. Walks to restroom on own ability.

## 2021-11-29 NOTE — ED Notes (Signed)
Pt verbalized understanding of d/c instructions, meds, and followup care. Denies questions. VSS, no distress noted. Steady gait to exit with all belongings. Brought to lobby in wheelchair for ease of transport. Bus pass given.

## 2021-11-29 NOTE — Discharge Instructions (Signed)
Eat and drink as well as you can for the next 48 hours.  Please return for chest pain shortness of breath worsening headache or neck pain.  Follow-up with your family doctor in the office.

## 2021-11-29 NOTE — ED Notes (Signed)
Help get patient undressed on the monitor patient is resting with call bell in reach

## 2021-11-29 NOTE — ED Provider Notes (Signed)
[facility] EMERGENCY DEPARTMENT Provider Note   CSN: [id number] Arrival date & time: 11/29/21  1024     History Chief Complaint  Patient presents with   Seizures    Joshua Cooper is a 45 y.o. male.  45 yo M with a chief complaints of a syncopal event.  The patient was talking with someone earlier today and felt somewhat lightheaded and dizzy and then woke up on the ground with people standing over him.  Complaining of a headache after waking up.  Denied headache neck pain chest pain shortness of breath abdominal pain prior to the event.  He was confused about what happened but otherwise is rapidly back to normal.  The patient denies one-sided numbness or weakness denies difficulty speech or swallowing.  He was just in the hospital for his mental health and was titrated up on some new medications for him.  He feels like he has been eating and drinking normally.  He denies vomiting or diarrhea denies dark stool or blood in the stool.  He denies cough congestion or fever.  Denies chest pain or pressure denies hemoptysis.  The history is provided by the patient.  Seizures Illness Severity:  Moderate Onset quality:  Sudden Duration:  2 minutes Timing:  Rare Progression:  Resolved Chronicity:  New Associated symptoms: no abdominal pain, no chest pain, no congestion, no diarrhea, no fever, no headaches, no myalgias, no rash, no shortness of breath and no vomiting       Past Medical History:  Diagnosis Date   Testicular cancer [facility])     Patient Active Problem List   Diagnosis Date Noted   MDD (major depressive disorder), recurrent severe, without psychosis ([place]) 11/25/2021   Small bowel obstruction ([place]) 10/23/2021   Perforated appendicitis 10/05/2021   Acute appendicitis 10/04/2021    Past Surgical History:  Procedure Laterality Date   CERVICAL FUSION     LAPAROSCOPIC APPENDECTOMY N/A 10/04/2021   Procedure: APPENDECTOMY LAPAROSCOPIC;  Surgeon: Leighton Ruff, MD;  Location: [facility];  Service: General;  Laterality: N/A;   TONSILLECTOMY         No family history on file.  Social History   Tobacco Use   Smoking status: Some Days    Types: Cigarettes   Smokeless tobacco: Never  Substance Use Topics   Alcohol use: Yes    Alcohol/week: 2.0 standard drinks    Types: 2 Cans of beer per week   Drug use: Not Currently    Home Medications Prior to Admission medications   Medication Sig Start Date End Date Taking? Authorizing Provider  albuterol (VENTOLIN HFA) 108 (90 Base) MCG/ACT inhaler Inhale 2 puffs into the lungs every 4 (four) hours as needed for wheezing or shortness of breath. 11/28/21   Ival Bible, MD  gabapentin (NEURONTIN) 300 MG capsule Take 1 capsule (300 mg total) by mouth 2 (two) times daily. 11/28/21   Ival Bible, MD  hydrOXYzine (ATARAX) 25 MG tablet Take 1 tablet (25 mg total) by mouth 3 (three) times daily as needed for anxiety. 11/28/21   Ival Bible, MD  lisinopril (ZESTRIL) 10 MG tablet Take 1 tablet (10 mg total) by mouth daily. 11/29/21   Ival Bible, MD  Multiple Vitamin (MULTIVITAMIN WITH MINERALS) TABS tablet Take 1 tablet by mouth daily. 11/29/21   Ival Bible, MD  nicotine (NICODERM CQ - DOSED IN MG/24 HOURS) 21 mg/24hr patch Place 1 patch (21 mg total) onto the skin daily. 11/29/21  Ival Bible, MD  nicotine polacrilex (NICORETTE) 2 MG gum Take 1 each (2 mg total) by mouth as needed for smoking cessation. 11/28/21   Ival Bible, MD  risperiDONE (RISPERDAL) 0.5 MG tablet Take 1 tablet (0.5 mg total) by mouth daily. 11/29/21   Ival Bible, MD  risperiDONE (RISPERDAL) 1 MG tablet Take 1 tablet (1 mg total) by mouth at bedtime. 11/28/21   Ival Bible, MD  thiamine 100 MG tablet Take 1 tablet (100 mg total) by mouth daily. 11/29/21   Ival Bible, MD  traZODone (DESYREL) 50 MG tablet Take 1 tablet (50 mg total) by mouth at bedtime  as needed for sleep. 11/28/21   Ival Bible, MD  venlafaxine XR (EFFEXOR-XR) 150 MG 24 hr capsule Take 1 capsule (150 mg total) by mouth daily with breakfast. 11/29/21   Ival Bible, MD    Allergies    Morphine and related  Review of Systems   Review of Systems  Constitutional:  Negative for chills and fever.  HENT:  Negative for congestion and facial swelling.   Eyes:  Negative for discharge and visual disturbance.  Respiratory:  Negative for shortness of breath.   Cardiovascular:  Negative for chest pain and palpitations.  Gastrointestinal:  Negative for abdominal pain, diarrhea and vomiting.  Musculoskeletal:  Negative for arthralgias and myalgias.  Skin:  Negative for color change and rash.  Neurological:  Positive for syncope. Negative for tremors, seizures and headaches.  Psychiatric/Behavioral:  Negative for confusion and dysphoric mood.    Physical Exam Updated Vital Signs BP 112/67   Pulse 88   Temp 97.8 F (36.6 C) (Oral)   Resp 18   Ht 5\' 11"  (1.803 m)   Wt 77.1 kg   SpO2 100%   BMI 23.71 kg/m   Physical Exam Vitals and nursing note reviewed.  Constitutional:      Appearance: He is well-developed.  HENT:     Head: Normocephalic and atraumatic.  Eyes:     Pupils: Pupils are equal, round, and reactive to light.  Neck:     Vascular: No JVD.  Cardiovascular:     Rate and Rhythm: Normal rate and regular rhythm.     Heart sounds: No murmur heard.   No friction rub. No gallop.  Pulmonary:     Effort: No respiratory distress.     Breath sounds: No wheezing.  Abdominal:     General: There is no distension.     Tenderness: There is no abdominal tenderness. There is no guarding or rebound.  Musculoskeletal:        General: Normal range of motion.     Cervical back: Normal range of motion and neck supple.  Skin:    Coloration: Skin is not pale.     Findings: No rash.  Neurological:     Mental Status: He is alert and oriented to person,  place, and time.     GCS: GCS eye subscore is 4. GCS verbal subscore is 5. GCS motor subscore is 6.     Cranial Nerves: Cranial nerves 2-12 are intact.     Sensory: Sensation is intact.     Motor: Motor function is intact.     Coordination: Coordination is intact.     Comments: Benign neurologic exam  Psychiatric:        Behavior: Behavior normal.    ED Results / Procedures / Treatments   Labs (all labs ordered are listed, but only abnormal results  are displayed) Labs Reviewed  CBC WITH DIFFERENTIAL/PLATELET - Abnormal; Notable for the following components:      Result Value   RBC 4.15 (*)    All other components within normal limits  COMPREHENSIVE METABOLIC PANEL - Abnormal; Notable for the following components:   Sodium 134 (*)    Glucose, Bld 116 (*)    AST 89 (*)    ALT 45 (*)    All other components within normal limits  CBG MONITORING, ED - Abnormal; Notable for the following components:   Glucose-Capillary 130 (*)    All other components within normal limits  MAGNESIUM  ETHANOL    EKG EKG Interpretation  Date/Time:  Thursday November 29 2021 10:26:06 EST Ventricular Rate:  97 PR Interval:  182 QRS Duration: 84 QT Interval:  386 QTC Calculation: 491 R Axis:   17 Text Interpretation: Sinus or ectopic atrial rhythm Minimal ST depression, inferior leads Minimal ST elevation, anterior leads Borderline prolonged QT interval Baseline wander TECHNICALLY DIFFICULT no wpw, prolonged qt or brugada No significant change since last tracing Confirmed by Deno Etienne [phone]) on 11/29/2021 11:03:46 AM  Radiology CT Head Wo Contrast  Result Date: 11/29/2021 CLINICAL DATA:  45 year old male with headache. EXAM: CT HEAD WITHOUT CONTRAST TECHNIQUE: Contiguous axial images were obtained from the base of the skull through the vertex without intravenous contrast. COMPARISON:  No priors. FINDINGS: Brain: No evidence of acute infarction, hemorrhage, hydrocephalus, extra-axial collection or  mass lesion/mass effect. Vascular: No hyperdense vessel or unexpected calcification. Skull: Normal. Negative for fracture or focal lesion. Sinuses/Orbits: Extensive multifocal mucosal thickening in the paranasal sinuses, most severe in the left maxillary, left ethmoids and frontal sinuses bilaterally. Small air-fluid level in the left maxillary sinus. Other: None. IMPRESSION: 1. No acute intracranial abnormalities. The appearance of the brain is normal. 2. However, there is extensive paranasal sinus disease with both chronic and acute features, most severe in the left maxillary sinus where there is an air-fluid level. Clinical correlation for signs and symptoms of acute sinusitis is recommended. Electronically Signed   By: Vinnie Langton M.D.   On: 11/29/2021 12:16    Procedures Procedures   Medications Ordered in ED Medications  sodium chloride 0.9 % bolus 1,000 mL (0 mLs Intravenous Stopped 11/29/21 1205)  prochlorperazine (COMPAZINE) injection 10 mg (10 mg Intravenous Given 11/29/21 1054)  diphenhydrAMINE (BENADRYL) injection 25 mg (25 mg Intravenous Given 11/29/21 1055)    ED Course  I have reviewed the triage vital signs and the nursing notes.  Pertinent labs & imaging results that were available during my care of the patient were reviewed by me and considered in my medical decision making (see chart for details).    MDM Rules/Calculators/A&P                           45 yo M with a chief complaints of a loss of consciousness.  Thought to be seizure-like activity per EMS.  Based on the patient's description most likely was a syncopal event he had a episode where he felt like he was going to pass out and then did and then was quickly back to baseline.  We will obtain a laboratory evaluation and CT scan of the head for acute onset headache that was after the event will give a bolus of IV fluids and reassess.  Lab work without significant finding.  CT of the head negative.  Patient is  headache significantly proved with a  headache cocktail.  Will discharge home.  PCP follow-up.  7:07 AM:  I have discussed the diagnosis/risks/treatment options with the patient and believe the pt to be eligible for discharge home to follow-up with PCP. We also discussed returning to the ED immediately if new or worsening sx occur. We discussed the sx which are most concerning (e.g., sudden worsening pain, fever, inability to tolerate by mouth) that necessitate immediate return. Medications administered to the patient during their visit and any new prescriptions provided to the patient are listed below.  Medications given during this visit Medications  sodium chloride 0.9 % bolus 1,000 mL (0 mLs Intravenous Stopped 11/29/21 1205)  prochlorperazine (COMPAZINE) injection 10 mg (10 mg Intravenous Given 11/29/21 1054)  diphenhydrAMINE (BENADRYL) injection 25 mg (25 mg Intravenous Given 11/29/21 1055)     The patient appears reasonably screen and/or stabilized for discharge and I doubt any other medical condition or other [facility] requiring further screening, evaluation, or treatment in the ED at this time prior to discharge.   Final Clinical Impression(s) / ED Diagnoses Final diagnoses:  Syncope and collapse    Rx / DC Orders ED Discharge Orders     None        Deno Etienne, DO 11/30/21 0626

## 2021-12-04 ENCOUNTER — Ambulatory Visit (INDEPENDENT_AMBULATORY_CARE_PROVIDER_SITE_OTHER): Payer: No Payment, Other | Admitting: Psychiatry

## 2021-12-04 ENCOUNTER — Other Ambulatory Visit: Payer: Self-pay

## 2021-12-04 ENCOUNTER — Encounter (HOSPITAL_COMMUNITY): Payer: Self-pay | Admitting: Psychiatry

## 2021-12-04 VITALS — BP 143/94 | HR 86 | Temp 98.1°F | Ht 71.0 in | Wt 175.6 lb

## 2021-12-04 DIAGNOSIS — F411 Generalized anxiety disorder: Secondary | ICD-10-CM

## 2021-12-04 DIAGNOSIS — F17200 Nicotine dependence, unspecified, uncomplicated: Secondary | ICD-10-CM

## 2021-12-04 DIAGNOSIS — F332 Major depressive disorder, recurrent severe without psychotic features: Secondary | ICD-10-CM | POA: Diagnosis not present

## 2021-12-04 MED ORDER — NICOTINE 21 MG/24HR TD PT24
21.0000 mg | MEDICATED_PATCH | Freq: Every day | TRANSDERMAL | 3 refills | Status: DC
  Filled 2021-12-04: qty 28, 28d supply, fill #0

## 2021-12-04 MED ORDER — RISPERIDONE 0.5 MG PO TABS
0.5000 mg | ORAL_TABLET | Freq: Every day | ORAL | 3 refills | Status: DC
  Filled 2021-12-04: qty 30, 30d supply, fill #0
  Filled 2022-01-13: qty 30, 30d supply, fill #1
  Filled 2022-01-14: qty 30, 30d supply, fill #0

## 2021-12-04 MED ORDER — GABAPENTIN 300 MG PO CAPS
300.0000 mg | ORAL_CAPSULE | Freq: Three times a day (TID) | ORAL | 3 refills | Status: DC
  Filled 2021-12-04: qty 90, 30d supply, fill #0
  Filled 2022-01-13: qty 90, 30d supply, fill #1
  Filled 2022-01-14: qty 90, 30d supply, fill #0

## 2021-12-04 MED ORDER — GABAPENTIN 300 MG PO CAPS: 300.0000 mg | ORAL_CAPSULE | Freq: Three times a day (TID) | ORAL | 3 refills | Status: DC

## 2021-12-04 MED ORDER — NICOTINE POLACRILEX 2 MG MT GUM
2.0000 mg | CHEWING_GUM | OROMUCOSAL | 3 refills | Status: DC | PRN
  Filled 2021-12-04: qty 100, fill #0
  Filled 2022-07-26: qty 50, 15d supply, fill #0
  Filled 2022-08-05: qty 100, 25d supply, fill #0

## 2021-12-04 MED ORDER — VENLAFAXINE HCL ER 150 MG PO CP24
150.0000 mg | ORAL_CAPSULE | Freq: Every day | ORAL | 3 refills | Status: DC
  Filled 2021-12-04: qty 30, 30d supply, fill #0
  Filled 2022-01-13: qty 30, 30d supply, fill #1
  Filled 2022-01-14: qty 30, 30d supply, fill #0

## 2021-12-04 MED ORDER — TRAZODONE HCL 100 MG PO TABS
100.0000 mg | ORAL_TABLET | Freq: Every evening | ORAL | 3 refills | Status: DC | PRN
  Filled 2021-12-04: qty 30, 30d supply, fill #0
  Filled 2022-01-13: qty 30, 30d supply, fill #1
  Filled 2022-01-14: qty 30, 30d supply, fill #0

## 2021-12-04 MED ORDER — NICOTINE POLACRILEX 2 MG MT GUM: 2.0000 mg | CHEWING_GUM | OROMUCOSAL | 3 refills | Status: DC | PRN

## 2021-12-04 MED ORDER — RISPERIDONE 1 MG PO TABS
1.0000 mg | ORAL_TABLET | Freq: Every day | ORAL | 3 refills | Status: DC
  Filled 2021-12-04: qty 30, 30d supply, fill #0
  Filled 2022-01-13: qty 30, 30d supply, fill #1
  Filled 2022-01-14: qty 30, 30d supply, fill #0
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
## 2021-12-05 ENCOUNTER — Encounter: Payer: Self-pay | Admitting: *Deleted

## 2021-12-05 NOTE — [facility] Nurse Program (Signed)
  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)     Encounter Details:  CNP Questionnaire - 12/05/21 1129       Questionnaire   Do you give verbal consent to treat you today? Yes    Location Patient [place] or Organization    Patient Status Homeless    Insurance Uninsured ([employer]/Self-Pay)    Insurance Referral N/A    Medication N/A   see note   Medical Provider No    Screening Referrals N/A    Medical Referral N/A    Medical Appointment Made N/A    Food N/A    Transportation Provided transportation assistance    Housing/Utilities No permanent housing    Interpersonal Safety N/A    Intervention Support;Counsel    ED Visit Averted N/A    Life-Saving Intervention Made N/A            Client came to nurse's office reporting he went to the [facility] as discussed in prior visit and got all of his medications except one that he needs to pick up. Gave two bus passes to get his medication. Client has appts made for f/u visits at [facility] Offered to assist with transportation closer to appt dates. He continues to look for housing. Local shelters are full tonight per prior calls.  Caetano Oberhaus W RN CN

## 2021-12-07 ENCOUNTER — Other Ambulatory Visit: Payer: Self-pay

## 2021-12-13 ENCOUNTER — Inpatient Hospital Stay: Payer: Medicaid Other | Admitting: Critical Care Medicine

## 2021-12-13 NOTE — Progress Notes (Deleted)
New Patient Office Visit  Subjective:  Patient ID: Joshua Cooper, male    DOB: 1976-02-03  Age: 45 y.o. MRN: [id number]  CC: No chief complaint on file.   HPI Joshua Cooper presents for *** In ED 11/29/21 Joshua Cooper is a 45 y.o. male.   45 yo M with a chief complaints of a syncopal event.  The patient was talking with someone earlier today and felt somewhat lightheaded and dizzy and then woke up on the ground with people standing over him.  Complaining of a headache after waking up.  Denied headache neck pain chest pain shortness of breath abdominal pain prior to the event.  He was confused about what happened but otherwise is rapidly back to normal.  The patient denies one-sided numbness or weakness denies difficulty speech or swallowing.  He was just in the hospital for his mental health and was titrated up on some new medications for him.  He feels like he has been eating and drinking normally.  He denies vomiting or diarrhea denies dark stool or blood in the stool.  He denies cough congestion or fever.  Denies chest pain or pressure denies hemoptysis.   The history is provided by the patient.  Seizures Illness Severity:  Moderate Onset quality:  Sudden Duration:  2 minutes Timing:  Rare Progression:  Resolved Chronicity:  New Associated symptoms: no abdominal pain, no chest pain, no congestion, no diarrhea, no fever, no headaches, no myalgias, no rash, no shortness of breath and no vomiting      45 yo M with a chief complaints of a loss of consciousness.  Thought to be seizure-like activity per EMS.  Based on the patient's description most likely was a syncopal event he had a episode where he felt like he was going to pass out and then did and then was quickly back to baseline.  We will obtain a laboratory evaluation and CT scan of the head for acute onset headache that was after the event will give a bolus of IV fluids and reassess.   Lab work without significant  finding.  CT of the head negative.  Patient is headache significantly proved with a headache cocktail.  Will discharge home.  PCP follow-up.   7:07 AM:  I have discussed the diagnosis/risks/treatment options with the patient and believe the pt to be eligible for discharge home to follow-up with PCP. We also discussed returning to the ED immediately if new or worsening sx occur. We discussed the sx which are most concerning (e.g., sudden worsening pain, fever, inability to tolerate by mouth) that necessitate immediate return. Medications administered to the patient during their visit and any new prescriptions provided to the patient are listed below.   Medications given during this visit Medications  sodium chloride 0.9 % bolus 1,000 mL (0 mLs Intravenous Stopped 11/29/21 1205)  prochlorperazine (COMPAZINE) injection 10 mg (10 mg Intravenous Given 11/29/21 1054)  diphenhydrAMINE (BENADRYL) injection 25 mg (25 mg Intravenous Given 11/29/21 1055)        The patient appears reasonably screen and/or stabilized for discharge and I doubt any other medical condition or other [facility] requiring further screening, evaluation, or treatment in the ED at this time prior to discharge.     Admit date: 10/23/2021 Discharge date: 10/27/2021   Admission Diagnoses:  Perforated appendicitis                         Small bowel obstruction   Discharge  Diagnoses: same Active Problems:   Small bowel obstruction [facility])     Discharged Condition: good   Hospital Course: Patient is a 45 year old male who presented to [facility] with abdominal pain on 10/23/21.  He underwent laparoscopic appendectomy on 79/0/24 which was complicated by perforation and post-op ileus and abscess. He was discharged from the hospital 10/18/21 and had been doing well until the day prior to admission. He was diagnosed with an early SBO.  He was admitted and placed to NG suction.  He also tested positive for COVID.   He began having bowel movements on  hospital day #1, which improved over the last couple of days.  He continues to have some abdominal pain, but is now tolerating a diet with several loose Bm yesterday.       Significant Diagnostic Studies:  CLINICAL DATA:  Abdominal pain.   EXAM: CT ABDOMEN AND PELVIS WITH CONTRAST   TECHNIQUE: Multidetector CT imaging of the abdomen and pelvis was performed using the standard protocol following bolus administration of intravenous contrast.   CONTRAST:  55mL OMNIPAQUE IOHEXOL 350 MG/ML SOLN   COMPARISON:  October 15, 2021   FINDINGS: Lower chest: No acute abnormality.   Hepatobiliary: No focal liver abnormality is seen. No gallstones, gallbladder wall thickening, or biliary dilatation.   Pancreas: Unremarkable. No pancreatic ductal dilatation or surrounding inflammatory changes.   Spleen: Normal in size without focal abnormality.   Adrenals/Urinary Tract: Adrenal glands are unremarkable. Kidneys are normal, without renal calculi or hydronephrosis. A 1.5 cm diameter simple cyst is seen within the lateral aspect of the mid right kidney. Bladder is unremarkable.   Stomach/Bowel: Stomach is within normal limits. The appendix is surgically absent. Multiple dilated small bowel loops are seen within the abdomen (maximum small bowel diameter of approximately 3.1 cm). A transition zone is seen within the anterior aspect of the mid abdomen, posterior to the region of the umbilicus (axial CT images 44 through 48, CT series 2). Mildly thickened small bowel loops are seen distal to this transition zone.   Vascular/Lymphatic: Aortic atherosclerosis. No enlarged abdominal or pelvic lymph nodes.   Reproductive: Prostate is unremarkable.   Other: No abdominal wall hernia or abnormality.   No abdominopelvic ascites. The right-sided pelvic abscess seen on the prior study is no longer visualized.   Musculoskeletal: No acute or significant osseous findings.   IMPRESSION: 1.  Small-bowel obstruction secondary to mild to moderate severity enteritis involving the mid to distal jejunum. 2. Interval resolution of the right-sided pelvic abscess seen on the prior study. 3. Aortic atherosclerosis.   Aortic Atherosclerosis (ICD10-I70.0).     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  Social History   Socioeconomic History   Marital status: Married    Spouse name: Not on file   Number of children: Not on file   Years of education: Not on file   Highest education level: Not on file  Occupational History   Not on file  Tobacco Use   Smoking status: Some Days    Types: Cigarettes   Smokeless tobacco: Never  Substance and Sexual Activity   Alcohol use: Yes  Alcohol/week: 2.0 standard drinks    Types: 2 Cans of beer per week   Drug use: Not Currently   Sexual activity: Not on file  Other Topics Concern   Not on file  Social History Narrative   Not on file   Social Determinants of Health   Financial Resource Strain: Not on file  Food Insecurity: Not on file  Transportation Needs: Not on file  Physical Activity: Not on file  Stress: Not on file  Social Connections: Not on file  Intimate Partner Violence: Not on file    ROS Review of Systems  Objective:   Today's Vitals: There were no vitals taken for this visit.  Physical Exam  Assessment & Plan:   Problem List Items Addressed This Visit   None   Outpatient Encounter Medications as of 12/13/2021  Medication Sig   albuterol (VENTOLIN HFA) 108 (90 Base) MCG/ACT inhaler Inhale 2 puffs into the lungs every 4 (four) hours as needed for wheezing or shortness  of breath.   gabapentin (NEURONTIN) 300 MG capsule Take 1 capsule (300 mg total) by mouth 3 (three) times daily.   hydrOXYzine (ATARAX) 25 MG tablet Take 1 tablet (25 mg total) by mouth 3 (three) times daily as needed for anxiety.   lisinopril (ZESTRIL) 10 MG tablet Take 1 tablet (10 mg total) by mouth daily.   Multiple Vitamin (MULTIVITAMIN WITH MINERALS) TABS tablet Take 1 tablet by mouth daily.   nicotine (NICODERM CQ - DOSED IN MG/24 HOURS) 21 mg/24hr patch Place 1 patch (21 mg total) onto the skin daily.   nicotine polacrilex (NICORETTE) 2 MG gum Take 1 each (2 mg total) by mouth as needed for smoking cessation.   risperiDONE (RISPERDAL) 0.5 MG tablet Take 1 tablet (0.5 mg total) by mouth daily.   risperiDONE (RISPERDAL) 1 MG tablet Take 1 tablet (1 mg total) by mouth at bedtime.   thiamine 100 MG tablet Take 1 tablet (100 mg total) by mouth daily.   traZODone (DESYREL) 100 MG tablet Take 1 tablet (100 mg total) by mouth at bedtime as needed for sleep.   venlafaxine XR (EFFEXOR-XR) 150 MG 24 hr capsule Take 1 capsule (150 mg total) by mouth daily with breakfast.   No facility-administered encounter medications on file as of 12/13/2021.    Follow-up: No follow-ups on file.   Asencion Noble, MD

## 2022-01-14 ENCOUNTER — Other Ambulatory Visit (HOSPITAL_COMMUNITY): Payer: Self-pay | Admitting: Psychiatry

## 2022-01-14 ENCOUNTER — Other Ambulatory Visit: Payer: Self-pay

## 2022-01-16 ENCOUNTER — Encounter (HOSPITAL_COMMUNITY): Payer: Self-pay | Admitting: Psychiatry

## 2022-01-16 ENCOUNTER — Other Ambulatory Visit: Payer: Self-pay

## 2022-01-16 ENCOUNTER — Other Ambulatory Visit (HOSPITAL_COMMUNITY): Payer: Self-pay | Admitting: Psychiatry

## 2022-01-16 ENCOUNTER — Telehealth (HOSPITAL_COMMUNITY): Payer: Self-pay | Admitting: Psychiatry

## 2022-01-16 ENCOUNTER — Telehealth (INDEPENDENT_AMBULATORY_CARE_PROVIDER_SITE_OTHER): Payer: No Payment, Other | Admitting: Psychiatry

## 2022-01-16 DIAGNOSIS — F332 Major depressive disorder, recurrent severe without psychotic features: Secondary | ICD-10-CM | POA: Diagnosis not present

## 2022-01-16 DIAGNOSIS — F17200 Nicotine dependence, unspecified, uncomplicated: Secondary | ICD-10-CM

## 2022-01-16 DIAGNOSIS — F411 Generalized anxiety disorder: Secondary | ICD-10-CM

## 2022-01-16 MED ORDER — TRAZODONE HCL 100 MG PO TABS
100.0000 mg | ORAL_TABLET | Freq: Every evening | ORAL | 3 refills | Status: DC | PRN
  Filled 2022-02-12: qty 30, 30d supply, fill #0
  Filled 2022-03-21: qty 30, 30d supply, fill #1

## 2022-01-16 MED ORDER — VENLAFAXINE HCL ER 75 MG PO CP24
225.0000 mg | ORAL_CAPSULE | Freq: Every day | ORAL | 3 refills | Status: DC
  Filled 2022-01-16 (×2): qty 90, 30d supply, fill #0
  Filled 2022-02-12: qty 90, 30d supply, fill #1

## 2022-01-16 MED ORDER — GABAPENTIN 300 MG PO CAPS
300.0000 mg | ORAL_CAPSULE | Freq: Three times a day (TID) | ORAL | 3 refills | Status: DC
  Filled 2022-02-12: qty 90, 30d supply, fill #0
  Filled 2022-03-21: qty 90, 30d supply, fill #1

## 2022-01-16 MED ORDER — HYDROXYZINE HCL 25 MG PO TABS
25.0000 mg | ORAL_TABLET | Freq: Three times a day (TID) | ORAL | 3 refills | Status: DC | PRN
Start: 2022-01-16 — End: 2022-05-21
  Filled 2022-01-16: qty 90, 30d supply, fill #0

## 2022-01-16 MED ORDER — HYDROXYZINE HCL 25 MG PO TABS: 25.0000 mg | ORAL_TABLET | Freq: Three times a day (TID) | ORAL | 0 refills | Status: DC | PRN

## 2022-01-16 MED ORDER — NICOTINE 21 MG/24HR TD PT24
21.0000 mg | MEDICATED_PATCH | Freq: Every day | TRANSDERMAL | 3 refills | Status: DC
  Filled 2022-01-16: qty 28, 28d supply, fill #0

## 2022-01-16 MED ORDER — RISPERIDONE 0.5 MG PO TABS
0.5000 mg | ORAL_TABLET | Freq: Every day | ORAL | 3 refills | Status: DC
  Filled 2022-02-12: qty 30, 30d supply, fill #0
  Filled 2022-03-21: qty 30, 30d supply, fill #1
  Filled 2022-04-23: qty 30, 30d supply, fill #2

## 2022-01-16 MED ORDER — RISPERIDONE 1 MG PO TABS
1.0000 mg | ORAL_TABLET | Freq: Every day | ORAL | 3 refills | Status: DC
  Filled 2022-02-12: qty 30, 30d supply, fill #0
  Filled 2022-03-21: qty 30, 30d supply, fill #1
  Filled 2022-04-23: qty 30, 30d supply, fill #2

## 2022-01-16 NOTE — Telephone Encounter (Signed)
Note created accidentally.

## 2022-01-16 NOTE — Progress Notes (Signed)
[facility] MD/PA/NP OP Progress Note Virtual Visit via Telephone Note  I connected with Joshua Cooper on 01/16/22 at  8:30 AM EST by telephone and verified that I am speaking with the correct person using two identifiers.  Location: Patient: home Provider: Clinic   I discussed the limitations, risks, security and privacy concerns of performing an evaluation and management service by telephone and the availability of in person appointments. I also discussed with the patient that there may be a patient responsible charge related to this service. The patient expressed understanding and agreed to proceed.   I provided 30 minutes of non-face-to-face time during this encounter.  01/16/2022 9:08 AM Dyke Weible  MRN:  [id number]  Chief Complaint: "Im running out of my medications and I do not know if I will be able to afford them"  HPI: 46 year old male seen today for follow-up psychiatric evaluation.  He has a psychiatric history of SI, anxiety, and depression.  He is currently being managed on  Risperdal 0.5 mg daily, Risperdal 1 mg nightly, trazodone 50 mg nightly as needed, Effexor 150 mg daily, hydroxyzine 25 mg 3 times daily, and gabapentin 300 mg twice daily.  He notes his medications are somewhat effective managing his psychiatric condition.  Today patient was unable to login virtually so assessment was done over the phone.  During exam he was pleasant, cooperative, and engaged in conversation.  He informed Probation officer that he will be running out of his medications today and continues to be homeless and unemployed.  He notes that he does not believe he can afford getting his medication although knows he needs it.  Provider informed patient to go to community health and wellness and asked for patient care assistance.  He endorsed understanding and agreed.  Patient reports that recently he was moved into a pallet home (tiny home design for the homeless).  He reports that he is grateful that he no longer  has to live in [place], outside, or under a bridge.  He informed Probation officer that he is anxious because he was told that he would be getting a roommate.  He reports that he is uncertain how having a roommate would impact his mental health and asked provider to write a letter documenting his mental health.  Provider was agreeable to this.  Patient continues to worry about finances as he is currently unemployed.  Patient notes that the above exacerbates his anxiety depression.  Today provider conducted a GAD-7 and patient scored a 17, at his last visit he scored a 18.  Provider also conducted PHQ-9 he scored 18, at his last visit he scored a 26.  He denies SI/HI/VAH, mania, or paranoia.  Today Effexor 150 mg increased to 225 mg to help manage anxiety and depression.  Patient will continue all other medications as prescribed.  No other concerns at this time. Visit Diagnosis:    ICD-10-CM   1. Generalized anxiety disorder  F41.1 gabapentin (NEURONTIN) 300 MG capsule    risperiDONE (RISPERDAL) 0.5 MG tablet    risperiDONE (RISPERDAL) 1 MG tablet    traZODone (DESYREL) 100 MG tablet    venlafaxine XR (EFFEXOR-XR) 75 MG 24 hr capsule    2. Tobacco dependence  F17.200 nicotine (NICODERM CQ - DOSED IN MG/24 HOURS) 21 mg/24hr patch    3. 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 No pertinent family history.  Social History:  Social History   Socioeconomic History   Marital status: Married    Spouse name: Not on file   Number of children: Not on file   Years of education: Not on file   Highest education level: Not on file  Occupational History   Not on file  Tobacco Use   Smoking status: Some Days    Types: Cigarettes   Smokeless tobacco: Never  Substance and Sexual Activity   Alcohol use: Yes    Alcohol/week: 2.0 standard drinks    Types: 2 Cans of beer per week   Drug use: Not Currently   Sexual activity: Not on file  Other Topics Concern   Not on file  Social History Narrative   Not on file   Social Determinants of Health   Financial Resource Strain: Not on file  Food Insecurity: Not on file  Transportation Needs: Not on file  Physical Activity: Not on file  Stress: Not on file  Social Connections: Not on file    Allergies:  Allergies  Allergen Reactions   Morphine And Related Itching    Metabolic Disorder Labs: Lab Results  Component Value Date   HGBA1C 4.4 (L) 11/24/2021   MPG 80 11/24/2021   No results found for: PROLACTIN Lab Results  Component Value Date   CHOL 149 11/24/2021   TRIG 81 11/24/2021   HDL 79 11/24/2021   CHOLHDL 1.9 11/24/2021   VLDL 16 11/24/2021   LDLCALC 54 11/24/2021   Lab Results  Component Value Date   TSH 0.853 11/24/2021    Therapeutic Level Labs: No results found for: LITHIUM No results found for: VALPROATE No components found for:  CBMZ  Current Medications: Current Outpatient Medications  Medication Sig Dispense Refill   albuterol (VENTOLIN HFA) 108 (90 Base) MCG/ACT inhaler Inhale 2 puffs into the lungs every 4 (four) hours as needed for wheezing or shortness of breath. 9 g 0   gabapentin (NEURONTIN) 300 MG capsule Take 1 capsule (300 mg total) by mouth 3 (three)  times daily. 90 capsule 3   hydrOXYzine (ATARAX) 25 MG tablet Take 1 tablet (25 mg total) by mouth 3 (three) times daily as needed for anxiety. 90 tablet 0   lisinopril (ZESTRIL) 10 MG tablet Take 1 tablet (10 mg total) by mouth daily. 30 tablet 0   Multiple Vitamin (MULTIVITAMIN WITH MINERALS) TABS tablet Take 1 tablet by mouth daily. 30 tablet 0   nicotine (NICODERM CQ - DOSED IN MG/24 HOURS) 21 mg/24hr patch Place 1 patch (21 mg total) onto the skin daily. 28 patch 3   nicotine polacrilex (NICORETTE) 2 MG gum Take 1 each (2 mg total) by mouth as needed for smoking cessation. 100 tablet 3   risperiDONE (RISPERDAL) 0.5 MG tablet Take 1 tablet (0.5 mg total) by mouth daily. 30 tablet 3   risperiDONE (  RISPERDAL) 1 MG tablet Take 1 tablet (1 mg total) by mouth at bedtime. 30 tablet 3   thiamine 100 MG tablet Take 1 tablet (100 mg total) by mouth daily. 30 tablet 0   traZODone (DESYREL) 100 MG tablet Take 1 tablet (100 mg total) by mouth at bedtime as needed for sleep. 30 tablet 3   venlafaxine XR (EFFEXOR-XR) 75 MG 24 hr capsule Take 3 capsules (225 mg total) by mouth daily with breakfast. 30 capsule 3   No current facility-administered medications for this visit.     Musculoskeletal: Strength & Muscle Tone:  Unable to assess due to telephone visit Gait & Station:  Unable to assess due to telephone visit Patient leans: N/A  Psychiatric Specialty Exam: Review of Systems  There were no vitals taken for this visit.There is no height or weight on file to calculate BMI.  General Appearance:  Unable to assess due to telephone visit  Eye Contact:   Unable to assess due to telephone visit  Speech:  Clear and Coherent and Normal Rate  Volume:  Normal  Mood:  Anxious and Depressed  Affect:  Appropriate and Congruent  Thought Process:  Coherent, Goal Directed, and Linear  Orientation:  Full (Time, Place, and Person)  Thought Content: WDL and Logical   Suicidal Thoughts:  No  Homicidal Thoughts:   No  Memory:  Immediate;   Good Recent;   Good Remote;   Good  Judgement:  Good  Insight:  Good  Psychomotor Activity:   Unable to assess due to telephone visit  Concentration:  Concentration: Good and Attention Span: Good  Recall:  Good  Fund of Knowledge: Good  Language: Good  Akathisia:   Unable to assess due to telephone visit  Handed:  Right  AIMS (if indicated): not done  Assets:  Communication Skills Desire for Improvement Housing Leisure Time Physical Health  ADL's:  Intact  Cognition: WNL  Sleep:  Good   Screenings: GAD-7    Flowsheet Row Office Visit from 12/04/2021 in [facility]  Total GAD-7 Score 18      PHQ2-9    [place] Office Visit from 12/04/2021 in [place]  PHQ-2 Total Score 6  PHQ-9 Total Score 26      [place] Office Visit from 12/04/2021 in [facility] ED from 11/29/2021 in [place] ED from 11/24/2021 in [place] Error: Q7 should not be populated when Q6 is No No Risk High Risk        Assessment and Plan: Patient endorses symptoms of anxiety and depression.  Today he is agreeable to increasing Effexor 150 mg to 225 mg to help manage anxiety and depression.  He will continue all other medications as prescribed.  1. Generalized anxiety disorder  Continue- gabapentin (NEURONTIN) 300 MG capsule; Take 1 capsule (300 mg total) by mouth 3 (three) times daily.  Dispense: 90 capsule; Refill: 3 Continue- risperiDONE (RISPERDAL) 0.5 MG tablet; Take 1 tablet (0.5 mg total) by mouth daily.  Dispense: 30 tablet; Refill: 3 Continue- risperiDONE (RISPERDAL) 1 MG tablet; Take 1 tablet (1 mg total) by mouth at bedtime.  Dispense: 30 tablet; Refill: 3 Continue- traZODone (DESYREL) 100 MG tablet; Take 1 tablet (100 mg total) by mouth at bedtime as needed for sleep.   Dispense: 30 tablet; Refill: 3 Increased- venlafaxine XR (EFFEXOR-XR) 75 MG 24 hr capsule; Take 3 capsules (225  mg total) by mouth daily with breakfast.  Dispense: 30 capsule; Refill: 3  2. Tobacco dependence  Continue- nicotine (NICODERM CQ - DOSED IN MG/24 HOURS) 21 mg/24hr patch; Place 1 patch (21 mg total) onto the skin daily.  Dispense: 28 patch; Refill: 3  3. MDD (major depressive disorder), recurrent severe, without psychosis ([place])  Continue- risperiDONE (RISPERDAL) 0.5 MG tablet; Take 1 tablet (0.5 mg total) by mouth daily.  Dispense: 30 tablet; Refill: 3 Continue- risperiDONE (RISPERDAL) 1 MG tablet; Take 1 tablet (1 mg total) by mouth at bedtime.  Dispense: 30 tablet; Refill: 3 Continue- traZODone (DESYREL) 100 MG tablet; Take 1 tablet (100 mg total) by mouth at bedtime as needed for sleep.  Dispense: 30 tablet; Refill: 3 Increase- venlafaxine XR (EFFEXOR-XR) 75 MG 24 hr capsule; Take 3 capsules (225 mg total) by mouth daily with breakfast.  Dispense: 30 capsule; Refill: 3  Follow-up in 78-month  Salley Slaughter, NP 01/16/2022, 9:08 AM

## 2022-01-18 ENCOUNTER — Other Ambulatory Visit: Payer: Self-pay

## 2022-01-22 ENCOUNTER — Other Ambulatory Visit: Payer: Self-pay

## 2022-01-29 ENCOUNTER — Ambulatory Visit (HOSPITAL_COMMUNITY): Payer: No Payment, Other | Admitting: Clinical

## 2022-02-04 ENCOUNTER — Other Ambulatory Visit: Payer: Self-pay

## 2022-02-12 ENCOUNTER — Other Ambulatory Visit: Payer: Self-pay

## 2022-02-12 ENCOUNTER — Other Ambulatory Visit (HOSPITAL_COMMUNITY): Payer: Self-pay | Admitting: Psychiatry

## 2022-02-12 DIAGNOSIS — F411 Generalized anxiety disorder: Secondary | ICD-10-CM

## 2022-02-12 DIAGNOSIS — F332 Major depressive disorder, recurrent severe without psychotic features: Secondary | ICD-10-CM

## 2022-02-12 MED ORDER — VENLAFAXINE HCL ER 75 MG PO CP24
225.0000 mg | ORAL_CAPSULE | Freq: Every day | ORAL | 3 refills | Status: DC
  Filled 2022-02-12: qty 90, 30d supply, fill #0
  Filled 2022-03-21: qty 90, 30d supply, fill #1

## 2022-02-13 ENCOUNTER — Other Ambulatory Visit: Payer: Self-pay

## 2022-02-26 ENCOUNTER — Encounter (HOSPITAL_COMMUNITY): Payer: No Payment, Other | Admitting: Psychiatry

## 2022-03-21 ENCOUNTER — Other Ambulatory Visit: Payer: Self-pay

## 2022-03-21 ENCOUNTER — Other Ambulatory Visit (HOSPITAL_COMMUNITY): Payer: Self-pay | Admitting: Psychiatry

## 2022-03-21 DIAGNOSIS — F411 Generalized anxiety disorder: Secondary | ICD-10-CM

## 2022-03-21 DIAGNOSIS — F332 Major depressive disorder, recurrent severe without psychotic features: Secondary | ICD-10-CM

## 2022-03-22 ENCOUNTER — Other Ambulatory Visit: Payer: Self-pay

## 2022-03-22 MED ORDER — VENLAFAXINE HCL ER 75 MG PO CP24
225.0000 mg | ORAL_CAPSULE | Freq: Every day | ORAL | 3 refills | Status: DC
  Filled 2022-03-22: qty 90, 30d supply, fill #0
  Filled 2022-04-23: qty 90, 30d supply, fill #1
  Filled 2022-04-26: qty 30, 10d supply, fill #1

## 2022-03-28 ENCOUNTER — Encounter (HOSPITAL_COMMUNITY): Payer: No Payment, Other | Admitting: Psychiatry

## 2022-04-03 ENCOUNTER — Other Ambulatory Visit: Payer: Self-pay

## 2022-04-03 MED ORDER — LISINOPRIL 10 MG PO TABS
10.0000 mg | ORAL_TABLET | Freq: Every day | ORAL | 0 refills | Status: DC
  Filled 2022-04-03: qty 30, 30d supply, fill #0

## 2022-04-03 MED ORDER — GABAPENTIN 600 MG PO TABS
ORAL_TABLET | ORAL | 0 refills | Status: DC
  Filled 2022-04-03: qty 90, 30d supply, fill #0

## 2022-04-08 ENCOUNTER — Other Ambulatory Visit: Payer: Self-pay

## 2022-04-23 ENCOUNTER — Other Ambulatory Visit: Payer: Self-pay

## 2022-04-26 ENCOUNTER — Other Ambulatory Visit: Payer: Self-pay

## 2022-05-06 ENCOUNTER — Other Ambulatory Visit (HOSPITAL_COMMUNITY): Payer: Self-pay | Admitting: Psychiatry

## 2022-05-06 DIAGNOSIS — F332 Major depressive disorder, recurrent severe without psychotic features: Secondary | ICD-10-CM

## 2022-05-06 DIAGNOSIS — F411 Generalized anxiety disorder: Secondary | ICD-10-CM

## 2022-05-09 ENCOUNTER — Other Ambulatory Visit (HOSPITAL_COMMUNITY): Payer: Self-pay | Admitting: Psychiatry

## 2022-05-09 ENCOUNTER — Other Ambulatory Visit: Payer: Self-pay

## 2022-05-09 DIAGNOSIS — F332 Major depressive disorder, recurrent severe without psychotic features: Secondary | ICD-10-CM

## 2022-05-09 DIAGNOSIS — F411 Generalized anxiety disorder: Secondary | ICD-10-CM

## 2022-05-10 ENCOUNTER — Other Ambulatory Visit: Payer: Self-pay

## 2022-05-13 ENCOUNTER — Other Ambulatory Visit: Payer: Self-pay

## 2022-05-16 ENCOUNTER — Other Ambulatory Visit: Payer: Self-pay

## 2022-05-16 ENCOUNTER — Telehealth (HOSPITAL_COMMUNITY): Payer: Self-pay | Admitting: *Deleted

## 2022-05-16 NOTE — Telephone Encounter (Signed)
Called needing his venalfaxine. He has not been seen since Jan, but the times he was scheduled and missed were when [facility] NP had her baby and then she was out on leave. He states he has messed up his housing and is homeless again. Without his medicine he said "it messes his head up" and he has negative thoughts. Brittney is planning to return on Tues to work, but in meantime invited [name] to walk in in the am tomorrow to be seen by a provider for his meds

## 2022-05-18 ENCOUNTER — Emergency Department (HOSPITAL_COMMUNITY)
Admission: EM | Admit: 2022-05-18 | Discharge: 2022-05-18 | Disposition: A | Discharge status: Home / Self Care | Payer: Medicaid Other | Attending: Emergency Medicine | Admitting: Emergency Medicine

## 2022-05-18 ENCOUNTER — Encounter (HOSPITAL_COMMUNITY): Payer: Self-pay | Admitting: *Deleted

## 2022-05-18 ENCOUNTER — Other Ambulatory Visit: Payer: Self-pay

## 2022-05-18 DIAGNOSIS — M272 Inflammatory conditions of jaws: Secondary | ICD-10-CM | POA: Insufficient documentation

## 2022-05-18 DIAGNOSIS — Z5321 Procedure and treatment not carried out due to patient leaving prior to being seen by health care provider: Secondary | ICD-10-CM | POA: Insufficient documentation

## 2022-05-18 NOTE — ED Triage Notes (Signed)
Abscess on rt lower jaw. Also small sores on back due to constantly picking on skin due to anxiety

## 2022-05-20 ENCOUNTER — Other Ambulatory Visit: Payer: Self-pay

## 2022-05-21 ENCOUNTER — Telehealth (INDEPENDENT_AMBULATORY_CARE_PROVIDER_SITE_OTHER): Payer: No Payment, Other | Admitting: Psychiatry

## 2022-05-21 ENCOUNTER — Other Ambulatory Visit: Payer: Self-pay

## 2022-05-21 ENCOUNTER — Encounter (HOSPITAL_COMMUNITY): Payer: Self-pay | Admitting: Psychiatry

## 2022-05-21 DIAGNOSIS — F332 Major depressive disorder, recurrent severe without psychotic features: Secondary | ICD-10-CM

## 2022-05-21 DIAGNOSIS — F411 Generalized anxiety disorder: Secondary | ICD-10-CM

## 2022-05-21 DIAGNOSIS — L0291 Cutaneous abscess, unspecified: Secondary | ICD-10-CM | POA: Diagnosis not present

## 2022-05-21 MED ORDER — RISPERIDONE 1 MG PO TABS
1.0000 mg | ORAL_TABLET | Freq: Every day | ORAL | 3 refills | Status: DC
  Filled 2022-05-21: qty 30, 30d supply, fill #0
  Filled 2022-06-21: qty 30, 30d supply, fill #1
  Filled 2022-07-26 – 2022-08-05 (×3): qty 30, 30d supply, fill #2
  Filled 2022-10-14: qty 30, 30d supply, fill #3

## 2022-05-21 MED ORDER — VENLAFAXINE HCL ER 75 MG PO CP24
225.0000 mg | ORAL_CAPSULE | Freq: Every day | ORAL | 3 refills | Status: DC
  Filled 2022-05-21: qty 90, 30d supply, fill #0
  Filled 2022-06-21: qty 30, 10d supply, fill #1

## 2022-05-21 MED ORDER — HYDROXYZINE HCL 25 MG PO TABS
25.0000 mg | ORAL_TABLET | Freq: Three times a day (TID) | ORAL | 3 refills | Status: DC | PRN
  Filled 2022-05-21: qty 90, 30d supply, fill #0

## 2022-05-21 MED ORDER — RISPERIDONE 0.5 MG PO TABS
0.5000 mg | ORAL_TABLET | Freq: Every day | ORAL | 3 refills | Status: DC
  Filled 2022-05-21: qty 30, 30d supply, fill #0
  Filled 2022-06-21: qty 30, 30d supply, fill #1
  Filled 2022-07-26 – 2022-08-05 (×2): qty 30, 30d supply, fill #2

## 2022-05-21 MED ORDER — GABAPENTIN 600 MG PO TABS
ORAL_TABLET | ORAL | 3 refills | Status: DC
  Filled 2022-05-21: qty 90, 30d supply, fill #0
  Filled 2022-06-21: qty 90, 30d supply, fill #1
  Filled 2022-07-26 – 2022-08-05 (×2): qty 90, 30d supply, fill #2
  Filled 2022-10-14: qty 90, 30d supply, fill #3

## 2022-05-21 NOTE — Progress Notes (Signed)
[place] MD/PA/NP OP Progress Note Virtual Visit via Video Note  I connected with Joshua Cooper on 05/21/22 at  1:30 PM EDT by a video enabled telemedicine application and verified that I am speaking with the correct person using two identifiers.  Location: Patient: Home Provider: Clinic   I discussed the limitations of evaluation and management by telemedicine and the availability of in person appointments. The patient expressed understanding and agreed to proceed.  I provided 30 minutes of non-face-to-face time during this encounter.   05/21/2022 1:52 PM Joshua Cooper  MRN:  [id number]  Chief Complaint: "Im running out of my med"  HPI: 46 year old male seen today for follow-up psychiatric evaluation.  He has a psychiatric history of SI, anxiety, and depression.  He is currently being managed on  Risperdal 0.5 mg daily, Risperdal 1 mg nightly, trazodone 50 mg nightly as needed, Effexor 225 mg daily, hydroxyzine 25 mg 3 times daily, and gabapentin 300 mg twice daily (increased to 600 mg TID by provider at [facility]).  He informed Probation officer that he discontinued trazodone due to grogginess and has been out of of Effexor and gabapentin for over a week.    Today he was well-groomed, pleasant, cooperative, engaged in conversation, maintained eye contact.  He informed Probation officer that life stressors are exacerbating his mental conditions.  He informed Probation officer that he continues to be unemployed and notes that he is homeless.  He reports that the pallet home (tiny home design for the homeless) program ended in March.  To cope with his anxiety he informed writer that he has been picking his skin and has abscesses on his head and face.  Provider noticed abscesses on patient's lower chin and cheek.  He notes that he went to the ED to get treated but left due to long wait.  He describes his abscess is painful.  Provider referred patient to community health and wellness for further evaluation.    Patient notes that the  above exacerbates his anxiety depression.  Today provider conducted a GAD-7 and patient scored a 20 , at his last visit he scored a 17.  Provider also conducted PHQ-9 he scored 21, at his last visit he scored a 18.  He endorses passive SI however denies wanting to harm himself today.  Today he denies SI/HI/VAH, mania, or paranoia.  Patient reports that his sleep is adequate however reports that his appetite has decreased.  He notes that he is lost 10 pounds since leaving [employer] and not eating solid meals daily.  Today Effexor and gabapentin restarted.  At this time patient does not want to restart trazodone.  He will continue other medications as prescribed.  Patient referred to community health and wellness for physical examination.  No other concerns at this time.    Visit Diagnosis:    ICD-10-CM   1. Abscess  L02.91 Ambulatory referral to Internal Medicine    2. Generalized anxiety disorder  F41.1 venlafaxine XR (EFFEXOR-XR) 75 MG 24 hr capsule    risperiDONE (RISPERDAL) 1 MG tablet    risperiDONE (RISPERDAL) 0.5 MG tablet    3. 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 Tobacco dependence  F17.200       Past Psychiatric History: Anxiety, depression, SI  Past Medical History:  Past Medical History:  Diagnosis Date  Testicular cancer [facility])     Past Surgical History:  Procedure Laterality Date   ABDOMINAL SURGERY     CERVICAL FUSION     LAPAROSCOPIC APPENDECTOMY N/A 10/04/2021   Procedure: APPENDECTOMY LAPAROSCOPIC;  Surgeon: Leighton Ruff, MD;  Location: [facility] ORS;  Service: General;  Laterality: N/A;   TONSILLECTOMY      Family Psychiatric History: Mother alcohol use, 4 year old son autism, Brother depression  Family History: No family history on file.  Social History:  Social History    Socioeconomic History   Marital status: Married    Spouse name: Not on file   Number of children: Not on file   Years of education: Not on file   Highest education level: Not on file  Occupational History   Not on file  Tobacco Use   Smoking status: Some Days    Types: Cigarettes   Smokeless tobacco: Never  Substance and Sexual Activity   Alcohol use: Yes    Alcohol/week: 2.0 standard drinks    Types: 2 Cans of beer per week   Drug use: Not Currently   Sexual activity: Not on file  Other Topics Concern   Not on file  Social History Narrative   Not on file   Social Determinants of Health   Financial Resource Strain: Not on file  Food Insecurity: Not on file  Transportation Needs: Not on file  Physical Activity: Not on file  Stress: Not on file  Social Connections: Not on file    Allergies:  Allergies  Allergen Reactions   Morphine And Related Itching    Metabolic Disorder Labs: Lab Results  Component Value Date   HGBA1C 4.4 (L) 11/24/2021   MPG 80 11/24/2021   No results found for: PROLACTIN Lab Results  Component Value Date   CHOL 149 11/24/2021   TRIG 81 11/24/2021   HDL 79 11/24/2021   CHOLHDL 1.9 11/24/2021   VLDL 16 11/24/2021   LDLCALC 54 11/24/2021   Lab Results  Component Value Date   TSH 0.853 11/24/2021    Therapeutic Level Labs: No results found for: LITHIUM No results found for: VALPROATE No components found for:  CBMZ  Current Medications: Current Outpatient Medications  Medication Sig Dispense Refill   albuterol (VENTOLIN HFA) 108 (90 Base) MCG/ACT inhaler Inhale 2 puffs into the lungs every 4 (four) hours as needed for wheezing or shortness of breath. 9 g 0   gabapentin (NEURONTIN) 600 MG tablet TAKE 1 TABLET BY MOUTH 3 TIMES DAILY. 90 tablet 3   hydrOXYzine (ATARAX) 25 MG tablet Take 1 tablet (25 mg total) by mouth 3 (three) times daily as needed for anxiety. 90 tablet 3   lisinopril (ZESTRIL) 10 MG tablet Take 1 tablet by  mouth once a day 30 tablet 0   Multiple Vitamin (MULTIVITAMIN WITH MINERALS) TABS tablet Take 1 tablet by mouth daily. 30 tablet 0   nicotine polacrilex (NICORETTE) 2 MG gum Take 1 each (2 mg total) by mouth as needed for smoking cessation. 100 tablet 3   risperiDONE (RISPERDAL) 0.5 MG tablet Take 1 tablet (0.5 mg total) by mouth daily. 30 tablet 3   risperiDONE (RISPERDAL) 1 MG tablet Take 1 tablet (1 mg total) by mouth at bedtime. 30 tablet 3   thiamine 100 MG tablet Take 1 tablet (100 mg total) by mouth daily. 30 tablet 0   venlafaxine XR (EFFEXOR-XR) 75 MG 24 hr capsule Take 3 capsules (225 mg total) by mouth daily with breakfast. 30 capsule  3   No current facility-administered medications for this visit.     Musculoskeletal: Strength & Muscle Tone:  Unable to assess due to teleheath visit Gait & Station:  Unable to assess due to telehealth visit Patient leans: N/A  Psychiatric Specialty Exam: Review of Systems  There were no vitals taken for this visit.There is no height or weight on file to calculate BMI.  General Appearance: Well Groomed  Eye Contact:  Good  Speech:  Clear and Coherent and Normal Rate  Volume:  Normal  Mood:  Anxious and Depressed  Affect:  Appropriate and Congruent  Thought Process:  Coherent, Goal Directed, and Linear  Orientation:  Full (Time, Place, and Person)  Thought Content: WDL and Logical   Suicidal Thoughts:  No  Homicidal Thoughts:  No  Memory:  Immediate;   Good Recent;   Good Remote;   Good  Judgement:  Good  Insight:  Good  Psychomotor Activity:  Normal  Concentration:  Concentration: Good and Attention Span: Good  Recall:  Good  Fund of Knowledge: Good  Language: Good  Akathisia:   Unable to assess due to telephone visit  Handed:  Right  AIMS (if indicated): not done  Assets:  Communication Skills Desire for Improvement Housing Leisure Time Physical Health  ADL's:  Intact  Cognition: WNL  Sleep:  Good   Screenings: GAD-7     Flowsheet Row Video Visit from 05/21/2022 in [facility] Video Visit from 01/16/2022 in [facility] Office Visit from 12/04/2021 in [facility]  Total GAD-7 Score '20 17 18      '$ PHQ2-9    Flowsheet Row Video Visit from 05/21/2022 in [facility] Video Visit from 01/16/2022 in [place] Office Visit from 12/04/2021 in [place]  PHQ-2 Total Score '5 5 6  '$ PHQ-9 Total Score '21 18 26      '$ Flowsheet Row Video Visit from 05/21/2022 in [facility] ED from 05/18/2022 in [place] DEPT Video Visit from 01/16/2022 in [place] Error: Q7 should not be populated when Q6 is No No Risk Error: Question 6 not populated        Assessment and Plan: Patient endorses symptoms of anxiety and depression due to life stressors.  Today he is agreeable to restarting Effexor and gabapentin.  At this time he does not want to restart trazodone.  He will continue all other medications as prescribed.  1. Generalized anxiety disorder  Continue- venlafaxine XR (EFFEXOR-XR) 75 MG 24 hr capsule; Take 3 capsules (225 mg total) by mouth daily with breakfast.  Dispense: 30 capsule; Refill: 3 Continue- risperiDONE (RISPERDAL) 1 MG tablet; Take 1 tablet (1 mg total) by mouth at bedtime.  Dispense: 30 tablet; Refill: 3 Continue- risperiDONE (RISPERDAL) 0.5 MG tablet; Take 1 tablet (0.5 mg total) by mouth daily.  Dispense: 30 tablet; Refill: 3  2. MDD (major depressive disorder), recurrent severe, without psychosis ([place])  Continue- venlafaxine XR (EFFEXOR-XR) 75 MG 24 hr capsule; Take 3 capsules (225 mg total) by mouth daily with breakfast.  Dispense: 30 capsule; Refill: 3 Continue- risperiDONE (RISPERDAL) 1 MG tablet; Take 1 tablet (1 mg total) by  mouth at bedtime.  Dispense: 30 tablet; Refill: 3 Continue- risperiDONE (RISPERDAL) 0.5 MG tablet; Take 1 tablet (0.5 mg total) by mouth daily.  Dispense: 30 tablet; Refill: 3  3. Abscess  -  Ambulatory referral to Internal Medicine  Follow-up in 15-month BSalley Slaughter NP 05/21/2022, 1:52 PM

## 2022-06-21 ENCOUNTER — Other Ambulatory Visit: Payer: Self-pay

## 2022-07-01 ENCOUNTER — Other Ambulatory Visit: Payer: Self-pay

## 2022-07-01 ENCOUNTER — Other Ambulatory Visit (HOSPITAL_COMMUNITY): Payer: Self-pay | Admitting: Psychiatry

## 2022-07-01 DIAGNOSIS — F332 Major depressive disorder, recurrent severe without psychotic features: Secondary | ICD-10-CM

## 2022-07-01 DIAGNOSIS — F411 Generalized anxiety disorder: Secondary | ICD-10-CM

## 2022-07-01 MED ORDER — VENLAFAXINE HCL ER 75 MG PO CP24
225.0000 mg | ORAL_CAPSULE | Freq: Every day | ORAL | 3 refills | Status: DC
  Filled 2022-07-01: qty 30, 10d supply, fill #0
  Filled 2022-07-03: qty 90, 30d supply, fill #0
  Filled 2022-07-26 – 2022-08-05 (×2): qty 30, 10d supply, fill #1

## 2022-07-03 ENCOUNTER — Encounter: Payer: Self-pay | Admitting: *Deleted

## 2022-07-03 ENCOUNTER — Other Ambulatory Visit: Payer: Self-pay

## 2022-07-03 NOTE — Congregational Nurse Program (Signed)
  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 Yes    Location Patient Served  [facility]    Visit Setting Church or Organization;Phone/Text/Email    Patient Status Homeless    Insurance Uninsured ([employer]s/Self-Pay)    Insurance Referral N/A    Medication N/A   see note   Medical Provider No    Screening Referrals N/A    Medical Referral N/A    Medical Appointment Made N/A    Food N/A    Transportation Provided transportation assistance;Need transportation assistance    Housing/Utilities No permanent housing    Interpersonal Safety N/A    Intervention Support    ED Visit Averted N/A    Life-Saving Intervention Made N/A            Client came to nurses office requesting bus tickets to pick up medication. Client receives medication from [employer]. Gave two bus passes. Client also wanted to know when appt was scheduled with Dr. Joya Gaskins. Called CN nurse at [facility]. Client has an appt 09/12/22 at 8:45. Calvary Difranco W RN CN

## 2022-07-29 ENCOUNTER — Other Ambulatory Visit: Payer: Self-pay

## 2022-08-05 ENCOUNTER — Other Ambulatory Visit: Payer: Self-pay

## 2022-08-05 ENCOUNTER — Encounter: Payer: Self-pay | Admitting: *Deleted

## 2022-08-05 NOTE — Congregational Nurse Program (Signed)
  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 Yes    Location Patient [place] or Organization    Patient Status Homeless    Insurance Uninsured ([employer]s/Self-Pay)    Insurance Referral N/A    Medication N/A   see note   Medical Provider No    Screening Referrals N/A    Medical Referral N/A    Medical Appointment Made N/A    Food N/A    Transportation Provided transportation assistance;Need transportation assistance    Housing/Utilities No permanent housing    Interpersonal Safety N/A    Intervention Support    ED Visit Averted N/A    Life-Saving Intervention Made N/A            Client came to [facility] requesting help with transportation to [employer]. Gave two bus passes as requested. Client is in need of insect repellant. Checked warehouse and could not locate any. Reported to [facility] front Hydrograph[employer] surveyor and she is checking. Veleta Yamamoto W RN CN

## 2022-08-13 ENCOUNTER — Other Ambulatory Visit: Payer: Self-pay

## 2022-08-13 ENCOUNTER — Emergency Department (HOSPITAL_COMMUNITY)
Admission: EM | Admit: 2022-08-13 | Discharge: 2022-08-14 | Disposition: A | Discharge status: Home / Self Care | Payer: Self-pay | Attending: Emergency Medicine | Admitting: Emergency Medicine

## 2022-08-13 ENCOUNTER — Encounter (HOSPITAL_COMMUNITY): Payer: Self-pay

## 2022-08-13 ENCOUNTER — Emergency Department (HOSPITAL_COMMUNITY): Payer: Medicaid Other

## 2022-08-13 DIAGNOSIS — J40 Bronchitis, not specified as acute or chronic: Secondary | ICD-10-CM | POA: Insufficient documentation

## 2022-08-13 DIAGNOSIS — Z8547 Personal history of malignant neoplasm of testis: Secondary | ICD-10-CM | POA: Insufficient documentation

## 2022-08-13 DIAGNOSIS — R0789 Other chest pain: Secondary | ICD-10-CM | POA: Insufficient documentation

## 2022-08-13 HISTORY — DX: Bronchitis, not specified as acute or chronic: J40

## 2022-08-13 LAB — CBC
HCT: 44.8 % (ref 39.0–52.0)
Hemoglobin: 14.6 g/dL (ref 13.0–17.0)
MCH: 28 pg (ref 26.0–34.0)
MCHC: 32.6 g/dL (ref 30.0–36.0)
MCV: 86 fL (ref 80.0–100.0)
Platelets: 228 10*3/uL (ref 150–400)
RBC: 5.21 MIL/uL (ref 4.22–5.81)
RDW: 13.9 % (ref 11.5–15.5)
WBC: 12.9 10*3/uL — ABNORMAL HIGH (ref 4.0–10.5)
nRBC: 0 % (ref 0.0–0.2)

## 2022-08-13 LAB — BASIC METABOLIC PANEL
Anion gap: 7 (ref 5–15)
BUN: 10 mg/dL (ref 6–20)
CO2: 25 mmol/L (ref 22–32)
Calcium: 8.5 mg/dL — ABNORMAL LOW (ref 8.9–10.3)
Chloride: 107 mmol/L (ref 98–111)
Creatinine, Ser: 0.75 mg/dL (ref 0.61–1.24)
GFR, Estimated: >60 mL/min (ref >60)
Glucose, Bld: 100 mg/dL — ABNORMAL HIGH (ref 70–99)
Potassium: 2.9 mmol/L — ABNORMAL LOW (ref 3.5–5.1)
Sodium: 139 mmol/L (ref 135–145)

## 2022-08-13 NOTE — ED Notes (Signed)
Urine sample sent to lab with save label.

## 2022-08-13 NOTE — ED Provider Triage Note (Signed)
Emergency Medicine Provider Triage Evaluation Note  Joshua Cooper , a 46 y.o. male  was evaluated in triage.  Pt complains of shortness of breath.  Patient states he has history bronchitis.  Patient reports that earlier tonight he was walking his sheets when he became short of breath all of a sudden.  The patient states he feels as if he has "something in my throat and need to spit up".  Patient denies any feelings of anaphylaxis.  Patient was given 5 mg albuterol, Solu-Medrol and fluid with EMS.  Patient reports feeling better after these are given.  Patient still complains of globus sensation in throat.  Review of Systems  Positive:  Negative:   Physical Exam  BP (!) 140/98 (BP Location: Left Arm)   Pulse 80   Temp 97.9 F (36.6 C)   Resp 15   SpO2 98%  Gen:   Awake, no distress   Resp:  Normal effort  MSK:   Moves extremities without difficulty  Other:    Medical Decision Making  Medically screening exam initiated at 10:34 PM.  Appropriate orders placed.  Alicia Morocho was informed that the remainder of the evaluation will be completed by another provider, this initial triage assessment does not replace that evaluation, and the importance of remaining in the ED until their evaluation is complete.     Azucena Cecil, PA-C 08/13/22 2235

## 2022-08-13 NOTE — ED Triage Notes (Signed)
Patient BIB [facility]. Patient walked to sheetz then felt short of breath. History of anxiety and chronic bronchitis, used his inhaler. Then called EMS.  EMS 5 of albuterol 538m bolus '125mg'$  solumedrol  Patient feels better now.

## 2022-08-14 ENCOUNTER — Other Ambulatory Visit: Payer: Self-pay

## 2022-08-14 ENCOUNTER — Other Ambulatory Visit (HOSPITAL_COMMUNITY): Payer: Self-pay | Admitting: Psychiatry

## 2022-08-14 DIAGNOSIS — F332 Major depressive disorder, recurrent severe without psychotic features: Secondary | ICD-10-CM

## 2022-08-14 DIAGNOSIS — F411 Generalized anxiety disorder: Secondary | ICD-10-CM

## 2022-08-14 LAB — TROPONIN I (HIGH SENSITIVITY)
Troponin I (High Sensitivity): <2 ng/L (ref <18)
Troponin I (High Sensitivity): <2 ng/L (ref <18)

## 2022-08-14 MED ORDER — PREDNISONE 20 MG PO TABS
40.0000 mg | ORAL_TABLET | Freq: Every day | ORAL | 0 refills | Status: DC
  Filled 2022-08-14: qty 8, 4d supply, fill #0

## 2022-08-14 MED ORDER — POTASSIUM CHLORIDE CRYS ER 20 MEQ PO TBCR
40.0000 meq | EXTENDED_RELEASE_TABLET | Freq: Once | ORAL | Status: AC
  Administered 2022-08-14: 40 meq via ORAL
  Filled 2022-08-14: qty 2

## 2022-08-14 MED ORDER — VENLAFAXINE HCL ER 75 MG PO CP24
225.0000 mg | ORAL_CAPSULE | Freq: Every day | ORAL | 3 refills | Status: DC
  Filled 2022-08-14: qty 30, 10d supply, fill #0
  Filled 2022-08-14: qty 90, 30d supply, fill #0

## 2022-08-14 MED ORDER — ALBUTEROL SULFATE HFA 108 (90 BASE) MCG/ACT IN AERS
2.0000 | INHALATION_SPRAY | Freq: Once | RESPIRATORY_TRACT | Status: AC
  Administered 2022-08-14: 2 via RESPIRATORY_TRACT
  Filled 2022-08-14: qty 6.7

## 2022-08-14 MED ORDER — ALBUTEROL SULFATE HFA 108 (90 BASE) MCG/ACT IN AERS
4.0000 | INHALATION_SPRAY | Freq: Once | RESPIRATORY_TRACT | Status: AC
  Administered 2022-08-14: 4 via RESPIRATORY_TRACT

## 2022-08-14 NOTE — ED Notes (Addendum)
Pt was ambulatory to the bathroom pulse oximetry was between 89-92

## 2022-08-14 NOTE — ED Provider Notes (Signed)
[facility] DEPT Provider Note   CSN: [id number] Arrival date & time: 08/13/22  2211     History  Chief Complaint  Patient presents with   Shortness of Breath    Joshua Cooper is a 46 y.o. male.  HPI     This is a 46 year old male with a history of bronchitis and smoking who presents with chest pain and shortness of breath.  Patient reports "I thought I was having a heart attack."  Patient states that he walked approximately 20 minutes to and from a gas station in the heat.  He developed pressure-like chest pain across the chest and shortness of breath.  No recent coughs or fevers.  Symptoms improved with inhaler by EMS.  Patient reports dry mouth and dry mucus in the back of his throat.  Home Medications Prior to Admission medications   Medication Sig Start Date End Date Taking? Authorizing Provider  albuterol (VENTOLIN HFA) 108 (90 Base) MCG/ACT inhaler Inhale 2 puffs into the lungs every 4 (four) hours as needed for wheezing or shortness of breath. 11/28/21  Yes Ival Bible, MD  diphenhydrAMINE HCl (BENADRYL ALLERGY PO) Take 1 tablet by mouth 3 times/day as needed-between meals & bedtime (allergies).   Yes [provider]  gabapentin (NEURONTIN) 600 MG tablet TAKE 1 TABLET BY MOUTH 3 TIMES DAILY. Patient taking differently: Take 600 mg by mouth 3 (three) times daily. 05/21/22  Yes Eulis Canner E, NP  predniSONE (DELTASONE) 20 MG tablet Take 2 tablets (40 mg total) by mouth daily. 08/14/22  Yes Jolan Upchurch, Barbette Hair, MD  risperiDONE (RISPERDAL) 0.5 MG tablet Take 1 tablet (0.5 mg total) by mouth daily. 05/21/22  Yes Eulis Canner E, NP  risperiDONE (RISPERDAL) 1 MG tablet Take 1 tablet (1 mg total) by mouth at bedtime. 05/21/22  Yes Eulis Canner E, NP  venlafaxine XR (EFFEXOR-XR) 75 MG 24 hr capsule Take 3 capsules (225 mg total) by mouth daily with breakfast. 07/01/22  Yes Eulis Canner E, NP  hydrOXYzine (ATARAX) 25 MG  tablet Take 1 tablet (25 mg total) by mouth 3 (three) times daily as needed for anxiety. Patient not taking: Reported on 08/14/2022 05/21/22   Salley Slaughter, NP  lisinopril (ZESTRIL) 10 MG tablet Take 1 tablet by mouth once a day Patient not taking: Reported on 08/14/2022 04/03/22     Multiple Vitamin (MULTIVITAMIN WITH MINERALS) TABS tablet Take 1 tablet by mouth daily. Patient not taking: Reported on 08/14/2022 11/29/21   Ival Bible, MD  nicotine polacrilex (NICORETTE) 2 MG gum Chew 1 (2 mg total) by mouth as needed for smoking cessation. Patient not taking: Reported on 08/14/2022 12/04/21   Salley Slaughter, NP  thiamine 100 MG tablet Take 1 tablet (100 mg total) by mouth daily. Patient not taking: Reported on 08/14/2022 11/29/21   Ival Bible, MD      Allergies    Morphine and related    Review of Systems   Review of Systems  Constitutional:  Negative for fever.  Respiratory:  Positive for shortness of breath.   Cardiovascular:  Positive for chest pain. Negative for leg swelling.  All other systems reviewed and are negative.   Physical Exam Updated Vital Signs BP (!) 135/101   Pulse 78   Temp 97.8 F (36.6 C) (Oral)   Resp 19   SpO2 96%  Physical Exam Vitals and nursing note reviewed.  Constitutional:      Appearance: He is well-developed. He is not  ill-appearing.  HENT:     Head: Normocephalic and atraumatic.  Eyes:     Pupils: Pupils are equal, round, and reactive to light.  Cardiovascular:     Rate and Rhythm: Normal rate and regular rhythm.     Heart sounds: Normal heart sounds. No murmur heard. Pulmonary:     Effort: Pulmonary effort is normal. No respiratory distress.     Breath sounds: Wheezing present.     Comments: Diffuse wheezing in all lung fields, good air movement Abdominal:     General: Bowel sounds are normal.     Palpations: Abdomen is soft.     Tenderness: There is no abdominal tenderness. There is no rebound.  Musculoskeletal:      Cervical back: Neck supple.     Right lower leg: No edema.     Left lower leg: No edema.  Lymphadenopathy:     Cervical: No cervical adenopathy.  Skin:    General: Skin is warm and dry.  Neurological:     Mental Status: He is alert and oriented to person, place, and time.  Psychiatric:        Mood and Affect: Mood normal.     ED Results / Procedures / Treatments   Labs (all labs ordered are listed, but only abnormal results are displayed) Labs Reviewed  CBC - Abnormal; Notable for the following components:      Result Value   WBC 12.9 (*)    All other components within normal limits  BASIC METABOLIC PANEL - Abnormal; Notable for the following components:   Potassium 2.9 (*)    Glucose, Bld 100 (*)    Calcium 8.5 (*)    All other components within normal limits  TROPONIN I (HIGH SENSITIVITY)  TROPONIN I (HIGH SENSITIVITY)    EKG EKG Interpretation  Date/Time:  Tuesday August 13 2022 22:27:42 EDT Ventricular Rate:  86 PR Interval:  215 QRS Duration: 93 QT Interval:  404 QTC Calculation: 484 R Axis:   38 Text Interpretation: Sinus rhythm Prolonged PR interval Probable left atrial enlargement Borderline prolonged QT interval Confirmed by Thayer Jew [phone]) on 08/14/2022 12:27:09 AM  Radiology DG Chest 2 View  Result Date: 08/13/2022 CLINICAL DATA:  Shortness of breath EXAM: CHEST - 2 VIEW COMPARISON:  10/15/2021 FINDINGS: Cardiac shadow is within normal limits. The lungs are well aerated bilaterally. No focal infiltrate or effusion is seen. Postsurgical changes in the cervical spine are noted. Old healing rib fractures are seen on the left. IMPRESSION: No acute abnormality noted. Electronically Signed   By: Inez Catalina M.D.   On: 08/13/2022 22:41    Procedures .Critical Care  Performed by: Merryl Hacker, MD Authorized by: Merryl Hacker, MD   Critical care provider statement:    Critical care time (minutes):  30   Critical care was necessary to  treat or prevent imminent or life-threatening deterioration of the following conditions:  Respiratory failure   Critical care was time spent personally by me on the following activities:  Development of treatment plan with patient or surrogate, discussions with consultants, evaluation of patient's response to treatment, examination of patient, ordering and review of laboratory studies, ordering and review of radiographic studies, ordering and performing treatments and interventions, pulse oximetry, re-evaluation of patient's condition and review of old charts     Medications Ordered in ED Medications  potassium chloride SA (KLOR-CON M) CR tablet 40 mEq (40 mEq Oral Given 08/14/22 0048)  albuterol (VENTOLIN HFA) 108 (90 Base) MCG/ACT  inhaler 2 puff (2 puffs Inhalation Given 08/14/22 0111)  albuterol (VENTOLIN HFA) 108 (90 Base) MCG/ACT inhaler 4 puff (4 puffs Inhalation Given 08/14/22 0324)    ED Course/ Medical Decision Making/ A&P Clinical Course as of 08/14/22 0504  Wed Aug 14, 2022  0504 On multiple rechecks, patient remains clinically stable and improved.  Troponin x2 negative. [CH]    Clinical Course User Index [CH] Shermaine Rivet, Barbette Hair, MD                           Medical Decision Making Amount and/or Complexity of Data Reviewed Radiology: ordered.  Risk Prescription drug management.   This patient presents to the ED for concern of chest pain, shortness of breath, this involves an extensive number of treatment options, and is a complaint that carries with it a high risk of complications and morbidity.  I considered the following differential and admission for this acute, potentially life threatening condition.  The differential diagnosis includes acute bronchitis, pneumonia, ACS, PE  MDM:    This is a 46 year old male who presents with chest pain and shortness of breath.  Much improved after DuoNeb administered by EMS.  He is nontoxic-appearing and vital signs are reassuring.  EKG  shows no evidence of acute ischemia or arrhythmia.  Chest x-ray shows no evidence of pneumonia.  Lab work obtained.  Initial troponin negative.  Potassium slightly low at 2.9.  Highly suspect acute bronchitis versus undiagnosed COPD given patient's acute history.  He remained slightly wheezy.  He was given an inhaler and a DuoNeb with ongoing improvement.  Repeat troponin is negative.  Highly doubt ACS.  Will send home on a burst dose of steroids and an inhaler.  Will provide a cardiology follow-up as an outpatient.  (Labs, imaging, consults)  Labs: I Ordered, and personally interpreted labs.  The pertinent results include: CBC, BMP, troponin x2  Imaging Studies ordered: I ordered imaging studies including chest x-ray I independently visualized and interpreted imaging. I agree with the radiologist interpretation  Additional history obtained from EMS.  External records from outside source obtained and reviewed including prior evaluations  Cardiac Monitoring: The patient was maintained on a cardiac monitor.  I personally viewed and interpreted the cardiac monitored which showed an underlying rhythm of: Sinus rhythm  Reevaluation: After the interventions noted above, I reevaluated the patient and found that they have :improved  Social Determinants of Health: Lives independently  Disposition: Discharge  Co morbidities that complicate the patient evaluation  Past Medical History:  Diagnosis Date   Bronchitis    Testicular cancer ([place])      Medicines Meds ordered this encounter  Medications   potassium chloride SA (KLOR-CON M) CR tablet 40 mEq   albuterol (VENTOLIN HFA) 108 (90 Base) MCG/ACT inhaler 2 puff   albuterol (VENTOLIN HFA) 108 (90 Base) MCG/ACT inhaler 4 puff   predniSONE (DELTASONE) 20 MG tablet    Sig: Take 2 tablets (40 mg total) by mouth daily.    Dispense:  8 tablet    Refill:  0    I have reviewed the patients home medicines and have made adjustments as  needed  Problem List / ED Course: Problem List Items Addressed This Visit   None Visit Diagnoses     Bronchitis    -  Primary   Atypical chest pain       Relevant Orders   Ambulatory referral to Cardiology  Final Clinical Impression(s) / ED Diagnoses Final diagnoses:  Bronchitis  Atypical chest pain    Rx / DC Orders ED Discharge Orders          Ordered    predniSONE (DELTASONE) 20 MG tablet  Daily        08/14/22 0502    Ambulatory referral to Cardiology       Comments: If you have not heard from the Cardiology office within the next 72 hours please call [phone].   08/14/22 0503              Ruari Mudgett, Barbette Hair, MD 08/14/22 [phone]

## 2022-08-14 NOTE — Discharge Instructions (Addendum)
You were seen today for chest pain and shortness of breath.  This is likely related to acute bronchitis.  Use the inhaler and prednisone as needed.  Follow-up with cardiology regarding your chest pain symptoms.  Today your work-up was reassuring.

## 2022-08-20 ENCOUNTER — Encounter (HOSPITAL_COMMUNITY): Payer: No Payment, Other | Admitting: Psychiatry

## 2022-08-20 ENCOUNTER — Encounter (HOSPITAL_COMMUNITY): Payer: Self-pay

## 2022-09-12 ENCOUNTER — Telehealth: Payer: Self-pay

## 2022-09-12 ENCOUNTER — Other Ambulatory Visit: Payer: Self-pay

## 2022-09-12 ENCOUNTER — Ambulatory Visit: Payer: Medicaid Other | Attending: Critical Care Medicine | Admitting: Critical Care Medicine

## 2022-09-12 ENCOUNTER — Encounter: Payer: Self-pay | Admitting: Critical Care Medicine

## 2022-09-12 VITALS — BP 127/84 | HR 67 | Temp 98.1°F | Ht 71.0 in | Wt 169.8 lb

## 2022-09-12 DIAGNOSIS — Z5901 Sheltered homelessness: Secondary | ICD-10-CM

## 2022-09-12 DIAGNOSIS — F17210 Nicotine dependence, cigarettes, uncomplicated: Secondary | ICD-10-CM

## 2022-09-12 DIAGNOSIS — Z23 Encounter for immunization: Secondary | ICD-10-CM

## 2022-09-12 DIAGNOSIS — Z8547 Personal history of malignant neoplasm of testis: Secondary | ICD-10-CM

## 2022-09-12 DIAGNOSIS — Z720 Tobacco use: Secondary | ICD-10-CM | POA: Insufficient documentation

## 2022-09-12 DIAGNOSIS — F411 Generalized anxiety disorder: Secondary | ICD-10-CM

## 2022-09-12 DIAGNOSIS — Z1211 Encounter for screening for malignant neoplasm of colon: Secondary | ICD-10-CM

## 2022-09-12 DIAGNOSIS — F332 Major depressive disorder, recurrent severe without psychotic features: Secondary | ICD-10-CM

## 2022-09-12 DIAGNOSIS — Z139 Encounter for screening, unspecified: Secondary | ICD-10-CM | POA: Insufficient documentation

## 2022-09-12 MED ORDER — ALBUTEROL SULFATE HFA 108 (90 BASE) MCG/ACT IN AERS
2.0000 | INHALATION_SPRAY | RESPIRATORY_TRACT | 0 refills | Status: DC | PRN
  Filled 2022-09-12: qty 6.7, 16d supply, fill #0

## 2022-09-12 MED ORDER — VENLAFAXINE HCL ER 75 MG PO CP24
225.0000 mg | ORAL_CAPSULE | Freq: Every day | ORAL | 1 refills | Status: DC
  Filled 2022-09-12 – 2022-09-23 (×2): qty 90, 30d supply, fill #0
  Filled 2022-10-29: qty 90, 30d supply, fill #1

## 2022-09-12 NOTE — Assessment & Plan Note (Signed)
Have refilled the gabapentin and risperadone

## 2022-09-12 NOTE — Progress Notes (Signed)
Testing for testicular cancer. Loss his insurance and is homeless, so did not have a chance to get testing done sent in from previous provider.

## 2022-09-12 NOTE — Assessment & Plan Note (Signed)
  .   Current smoking consumption amount: 1 pack a day  Dicsussion on advise to quit smoking and smoking impacts: Lung impacts  Patient's willingness to quit: Willing to quit  Methods to quit smoking discussed: Behavioral modification nicotine replacement  Medication management of smoking session drugs discussed: Nicotine replacement  Resources provided:  AVS   Setting quit date not established  Follow-up arranged 4 months   Time spent counseling the patient: 5 minutes   

## 2022-09-12 NOTE — Assessment & Plan Note (Signed)
History of major depression currently under good control with Effexor we will continue same and refills given

## 2022-09-12 NOTE — Assessment & Plan Note (Signed)
History of testicular cancer he has not had any blood work for tumor markers or imaging studies since 2021  Patient does not have insurance  Patient will apply for the orange card [facility] discount once this is achieved we can get an MRI of the abdomen which needs to be done  We will check tumor markers at this visit including hCG and alpha-fetoprotein

## 2022-09-12 NOTE — Telephone Encounter (Signed)
I met with the patient when he was in the clinic today. He explained that he is currently living in a tent.  He had been staying in one of the [place] pallet homes during the winter. He has no income and is uninsured.  He does not believe he would qualify for disability at this time   He explained that he has been following up with behavioral health services for his mental health concerns.  He has been to the [facility].   He said he has worked with the [employer] program regarding housing and he is in  agreement for me to make a referral to [employer]. There is no guarantee that he would be accepted and he understood. He confirmed the best phone number to reach him.   [facility] referral submitted. - referral ID: [id number]

## 2022-09-12 NOTE — Progress Notes (Signed)
New Patient Office Visit  Subjective    Patient ID: Joshua Cooper, male    DOB: [birth year]  Age: 46 y.o. MRN: [id number]  CC:  Chief Complaint  Patient presents with   Establish Care    HPI Joshua Cooper presents to establish care This is a 46 year old male who unfortunately is homeless living in a tent off [address].  He became homeless 15 months ago when his spouse who has underlying schizophrenia developed destabilization they were living in the [place] area.  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  This but financial pressure on he and his family.  He has 3 children and he is now separated from his wife and 3 children and had to go to a tent encampment.  Note this patient is originally from [place] he was born in [place] and moved to [place] in the age of 84.  Patient has a prior history of testicular cancer and had an orchiectomy in the past and was followed at [facility] in [place].  Note on arrival blood pressure is 127/84.  He does agree to receive the flu vaccine.  He does have a history himself of generalized anxiety disorder and depression.  He currently is not suicidal.  He states if anything he is more anxious than anything else.  He occasionally smokes cigarettes.  He does not drink alcohol and does not use illicit drugs.  He has a mental health provider at [facility] and they recently have rewritten medications for him he is on 225 mg of Effexor daily and takes respite all as well.  Note patient had urgent care visit for bronchitis earlier this summer received a course of prednisone and has an inhaler.  He also takes gabapentin as needed for anxiety.  There are no other chronic medical illnesses.  He did have an emergency appendectomy with complications of bowel obstruction afterwards several years ago.  He has not had health screenings or primary  care in any recent times.  He does not have any insurance.  He does occasionally go to [facility] for services.  Outpatient Encounter Medications as of 09/12/2022  Medication Sig   gabapentin (NEURONTIN) 600 MG tablet TAKE 1 TABLET BY MOUTH 3 TIMES DAILY.   risperiDONE (RISPERDAL) 0.5 MG tablet Take 1 tablet (0.5 mg total) by mouth daily.   risperiDONE (RISPERDAL) 1 MG tablet Take 1 tablet (1 mg total) by mouth at bedtime.   [DISCONTINUED] albuterol (VENTOLIN HFA) 108 (90 Base) MCG/ACT inhaler Inhale 2 puffs into the lungs every 4 (four) hours as needed for wheezing or shortness of breath.   [DISCONTINUED] venlafaxine XR (EFFEXOR-XR) 75 MG 24 hr capsule Take 3 capsules (225 mg total) by mouth daily with breakfast.   albuterol (VENTOLIN HFA) 108 (90 Base) MCG/ACT inhaler Inhale 2 puffs into the lungs every 4 (four) hours as needed for wheezing or shortness of breath.   venlafaxine XR (EFFEXOR-XR) 75 MG 24 hr capsule Take 3 capsules (225 mg total) by mouth daily with breakfast.   [DISCONTINUED] diphenhydrAMINE HCl (BENADRYL ALLERGY PO) Take 1 tablet by mouth 3 times/day as needed-between meals & bedtime (allergies). (Patient not taking: Reported on 09/12/2022)   [DISCONTINUED] hydrOXYzine (ATARAX) 25 MG tablet Take 1 tablet (25 mg total) by mouth 3 (three) times daily as needed for anxiety. (Patient not taking: Reported on 08/14/2022)   [DISCONTINUED] lisinopril (ZESTRIL) 10 MG tablet  Take 1 tablet by mouth once a day (Patient not taking: Reported on 08/14/2022)   [DISCONTINUED] Multiple Vitamin (MULTIVITAMIN WITH MINERALS) TABS tablet Take 1 tablet by mouth daily. (Patient not taking: Reported on 08/14/2022)   [DISCONTINUED] nicotine polacrilex (NICORETTE) 2 MG gum Chew 1 (2 mg total) by mouth as needed for smoking cessation. (Patient not taking: Reported on 08/14/2022)   [DISCONTINUED] predniSONE (DELTASONE) 20 MG tablet Take 2 tablets (40 mg total) by mouth daily. (Patient not taking: Reported on 09/12/2022)    [DISCONTINUED] thiamine 100 MG tablet Take 1 tablet (100 mg total) by mouth daily. (Patient not taking: Reported on 08/14/2022)   No facility-administered encounter medications on file as of 09/12/2022.    Past Medical History:  Diagnosis Date   Bronchitis    Perforated appendicitis 10/05/2021   Small bowel obstruction ([place]) 10/23/2021   Testicular cancer [facility])     Past Surgical History:  Procedure Laterality Date   ABDOMINAL SURGERY     CERVICAL FUSION     LAPAROSCOPIC APPENDECTOMY N/A 10/04/2021   Procedure: APPENDECTOMY LAPAROSCOPIC;  Surgeon: Leighton Ruff, MD;  Location: [facility];  Service: General;  Laterality: N/A;   TONSILLECTOMY      Family History  Problem Relation Age of Onset   Heart disease Father     Social History   Socioeconomic History   Marital status: Legally Separated    Spouse name: Not on file   Number of children: 3   Years of education: Not on file   Highest education level: Not on file  Occupational History   Not on file  Tobacco Use   Smoking status: Some Days    Types: Cigarettes   Smokeless tobacco: Never  Substance and Sexual Activity   Alcohol use: Yes    Alcohol/week: 2.0 standard drinks of alcohol    Types: 2 Cans of beer per week   Drug use: Not Currently   Sexual activity: Not on file  Other Topics Concern   Not on file  Social History Narrative   unemployed   Social Determinants of Health   Financial Resource Strain: Not on file  Food Insecurity: Not on file  Transportation Needs: Not on file  Physical Activity: Not on file  Stress: Not on file  Social Connections: Not on file  Intimate Partner Violence: Not on file    Review of Systems  Constitutional:  Negative for chills, diaphoresis, fever, malaise/fatigue and weight loss.  HENT:  Negative for congestion, hearing loss, nosebleeds, sore throat and tinnitus.   Eyes:  Negative for blurred vision, photophobia and redness.  Respiratory:  Negative for cough, hemoptysis,  sputum production, shortness of breath, wheezing and stridor.   Cardiovascular:  Negative for chest pain, palpitations, orthopnea, claudication, leg swelling and PND.  Gastrointestinal:  Negative for abdominal pain, blood in stool, constipation, diarrhea, heartburn, nausea and vomiting.  Genitourinary:  Negative for dysuria, flank pain, frequency, hematuria and urgency.  Musculoskeletal:  Negative for back pain, falls, joint pain, myalgias and neck pain.  Skin:  Negative for itching and rash.  Neurological:  Negative for dizziness, tingling, tremors, sensory change, speech change, focal weakness, seizures, loss of consciousness, weakness and headaches.  Endo/Heme/Allergies:  Negative for environmental allergies and polydipsia. Does not bruise/bleed easily.  Psychiatric/Behavioral:  Positive for depression. Negative for hallucinations, memory loss, substance abuse and suicidal ideas. The patient is nervous/anxious. The patient does not have insomnia.        Panic attack  Objective    BP 127/84   Pulse 67   Temp 98.1 F (36.7 C) (Oral)   Ht '5\' 11"'  (1.803 m)   Wt 169 lb 12.8 oz (77 kg)   BMI 23.68 kg/m   Physical Exam Vitals reviewed.  Constitutional:      Appearance: Normal appearance. He is well-developed and normal weight. He is not diaphoretic.  HENT:     Head: Normocephalic and atraumatic.     Right Ear: Tympanic membrane normal.     Left Ear: Tympanic membrane normal.     Nose: No nasal deformity, septal deviation, mucosal edema or rhinorrhea.     Right Sinus: No maxillary sinus tenderness or frontal sinus tenderness.     Left Sinus: No maxillary sinus tenderness or frontal sinus tenderness.     Mouth/Throat:     Mouth: Mucous membranes are moist.     Pharynx: Oropharynx is clear. No oropharyngeal exudate.  Eyes:     General: No scleral icterus.    Conjunctiva/sclera: Conjunctivae normal.     Pupils: Pupils are equal, round, and reactive to light.  Neck:      Thyroid: No thyromegaly.     Vascular: No carotid bruit or JVD.     Trachea: Trachea normal. No tracheal tenderness or tracheal deviation.  Cardiovascular:     Rate and Rhythm: Normal rate and regular rhythm.     Chest Wall: PMI is not displaced.     Pulses: Normal pulses. No decreased pulses.     Heart sounds: Normal heart sounds, S1 normal and S2 normal. Heart sounds not distant. No murmur heard.    No systolic murmur is present.     No diastolic murmur is present.     No friction rub. No gallop. No S3 or S4 sounds.  Pulmonary:     Effort: No tachypnea, accessory muscle usage or respiratory distress.     Breath sounds: No stridor. No decreased breath sounds, wheezing, rhonchi or rales.  Chest:     Chest wall: No tenderness.  Abdominal:     General: Bowel sounds are normal. There is no distension.     Palpations: Abdomen is soft. Abdomen is not rigid. There is no mass.     Tenderness: There is no abdominal tenderness. There is no right CVA tenderness, left CVA tenderness, guarding or rebound.     Hernia: No hernia is present.  Genitourinary:    Penis: Normal.      Comments: Left testicle is gone there is a implant replacement that is normal Right testicle is normal no nodules felt no pelvic inguinal lymphadenopathy determined Musculoskeletal:        General: Normal range of motion.     Cervical back: Normal range of motion and neck supple. No edema, erythema or rigidity. No muscular tenderness. Normal range of motion.  Lymphadenopathy:     Head:     Right side of head: No submental or submandibular adenopathy.     Left side of head: No submental or submandibular adenopathy.     Cervical: No cervical adenopathy.  Skin:    General: Skin is warm and dry.     Coloration: Skin is not pale.     Findings: No rash.     Nails: There is no clubbing.  Neurological:     Mental Status: He is alert and oriented to person, place, and time.     Sensory: No sensory deficit.  Psychiatric:         Attention  and Perception: Attention and perception normal.        Mood and Affect: Mood and affect normal.        Speech: Speech normal.        Behavior: Behavior normal. Behavior is cooperative.        Thought Content: Thought content is not paranoid or delusional. Thought content does not include homicidal or suicidal ideation. Thought content does not include homicidal or suicidal plan.        Cognition and Memory: Cognition and memory normal.        Judgment: Judgment normal.         Assessment & Plan:   Problem List Items Addressed This Visit       Other   MDD (major depressive disorder), recurrent severe, without psychosis ([address]) - Primary    History of major depression currently under good control with Effexor we will continue same and refills given      Relevant Medications   venlafaxine XR (EFFEXOR-XR) 75 MG 24 hr capsule   Generalized anxiety disorder    Have refilled the gabapentin and risperadone       Relevant Medications   venlafaxine XR (EFFEXOR-XR) 75 MG 24 hr capsule   History of primary testicular cancer    History of testicular cancer he has not had any blood work for tumor markers or imaging studies since 2021  Patient does not have insurance  Patient will apply for the orange card [facility] discount once this is achieved we can get an MRI of the abdomen which needs to be done  We will check tumor markers at this visit including hCG and alpha-fetoprotein      Relevant Orders   AFP tumor marker   HCG, Tumor Marker   Encounter for health-related screening    Plan to obtain health screenings to include hepatitis C HIV metabolic panel blood counts lipid panel screen for diabetes administer flu vaccine and issue fecal occult kit for colon cancer screening      Relevant Orders   Comprehensive metabolic panel   CBC with Differential/Platelet   Hemoglobin A1c   Lipid panel   HCV Ab w Reflex to Quant PCR   Sheltered homelessness tent encampment     Living in a tent encampment we connected patient with clinical social work at this visit      Tobacco use       Current smoking consumption amount: 1 pack a day  Dicsussion on advise to quit smoking and smoking impacts: Lung impacts  Patient's willingness to quit: Willing to quit  Methods to quit smoking discussed: Behavioral modification nicotine replacement  Medication management of smoking session drugs discussed: Nicotine replacement  Resources provided:  AVS   Setting quit date not established  Follow-up arranged 4 months   Time spent counseling the patient: 5 minutes        Other Visit Diagnoses     Colon cancer screening       Relevant Orders   Fecal occult blood, imunochemical   Need for immunization against influenza       Relevant Orders   Flu Vaccine QUAD 67moIM (Fluarix, Fluzone & Alfiuria Quad PF) (Completed)     45 minutes spent extra time needed because of social determinants of health barriers  Return in about 4 months (around 01/12/2023) for chronic conditions.   PAsencion Noble MD

## 2022-09-12 NOTE — Patient Instructions (Signed)
Pick up [place] discount application on the way out  Flu vaccine given  Try to reduce your tobacco intake  Testicular cancer screening and complete health screening labs obtained at this visit  Refills on Effexor sent to our pharmacy along with your albuterol inhaler  Return to Dr. Joya Gaskins 4 months   Please go to the [facility] for the walk-in clinic to reestablish for medication management see below  [facility] [address], [place], [place] [address] [phone] or [phone] Walk-in urgent care 24/7 for anyone  For [facility] ONLY New patient assessments and therapy walk-ins: Monday and Wednesday 8am-11am First and second Friday 1pm-5pm New patient psychiatry and medication management walk-ins: Mondays, Wednesdays, Thursdays, Fridays 8am-11am No psychiatry walk-ins on Tuesday

## 2022-09-12 NOTE — Assessment & Plan Note (Signed)
Plan to obtain health screenings to include hepatitis C HIV metabolic panel blood counts lipid panel screen for diabetes administer flu vaccine and issue fecal occult kit for colon cancer screening

## 2022-09-12 NOTE — Assessment & Plan Note (Signed)
Living in a tent encampment we connected patient with clinical social work at this visit

## 2022-09-13 ENCOUNTER — Telehealth: Payer: Self-pay

## 2022-09-13 LAB — COMPREHENSIVE METABOLIC PANEL
ALT: 16 IU/L (ref 0–44)
AST: 23 IU/L (ref 0–40)
Albumin/Globulin Ratio: 2 (ref 1.2–2.2)
Albumin: 3.8 g/dL — ABNORMAL LOW (ref 4.1–5.1)
Alkaline Phosphatase: 50 IU/L (ref 44–121)
BUN/Creatinine Ratio: 11 (ref 9–20)
BUN: 9 mg/dL (ref 6–24)
Bilirubin Total: 0.6 mg/dL (ref 0.0–1.2)
CO2: 26 mmol/L (ref 20–29)
Calcium: 9 mg/dL (ref 8.7–10.2)
Chloride: 104 mmol/L (ref 96–106)
Creatinine, Ser: 0.83 mg/dL (ref 0.76–1.27)
Globulin, Total: 1.9 g/dL (ref 1.5–4.5)
Glucose: 88 mg/dL (ref 70–99)
Potassium: 4.6 mmol/L (ref 3.5–5.2)
Sodium: 143 mmol/L (ref 134–144)
Total Protein: 5.7 g/dL — ABNORMAL LOW (ref 6.0–8.5)
eGFR: 109 mL/min/{1.73_m2} (ref >59)

## 2022-09-13 LAB — BETA HCG QUANT (REF LAB): hCG Quant: <1 m[IU]/mL (ref 0–3)

## 2022-09-13 LAB — CBC WITH DIFFERENTIAL/PLATELET
Basophils Absolute: 0.1 10*3/uL (ref 0.0–0.2)
Basos: 1 %
EOS (ABSOLUTE): 0.3 10*3/uL (ref 0.0–0.4)
Eos: 4 %
Hematocrit: 50 % (ref 37.5–51.0)
Hemoglobin: 16.6 g/dL (ref 13.0–17.7)
Immature Grans (Abs): 0 10*3/uL (ref 0.0–0.1)
Immature Granulocytes: 0 %
Lymphocytes Absolute: 2.9 10*3/uL (ref 0.7–3.1)
Lymphs: 32 %
MCH: 28.3 pg (ref 26.6–33.0)
MCHC: 33.2 g/dL (ref 31.5–35.7)
MCV: 85 fL (ref 79–97)
Monocytes Absolute: 0.5 10*3/uL (ref 0.1–0.9)
Monocytes: 6 %
Neutrophils Absolute: 5.2 10*3/uL (ref 1.4–7.0)
Neutrophils: 57 %
Platelets: 258 10*3/uL (ref 150–450)
RBC: 5.87 x10E6/uL — ABNORMAL HIGH (ref 4.14–5.80)
RDW: 13.2 % (ref 11.6–15.4)
WBC: 9 10*3/uL (ref 3.4–10.8)

## 2022-09-13 LAB — LIPID PANEL
Chol/HDL Ratio: 3.2 ratio (ref 0.0–5.0)
Cholesterol, Total: 165 mg/dL (ref 100–199)
HDL: 51 mg/dL (ref >39)
LDL Chol Calc (NIH): 94 mg/dL (ref 0–99)
Triglycerides: 109 mg/dL (ref 0–149)
VLDL Cholesterol Cal: 20 mg/dL (ref 5–40)

## 2022-09-13 LAB — HCV AB W REFLEX TO QUANT PCR: HCV Ab: NONREACTIVE

## 2022-09-13 LAB — HEMOGLOBIN A1C
Est. average glucose Bld gHb Est-mCnc: 103 mg/dL
Hgb A1c MFr Bld: 5.2 % (ref 4.8–5.6)

## 2022-09-13 LAB — HCV INTERPRETATION

## 2022-09-13 LAB — AFP TUMOR MARKER: AFP, Serum, Tumor Marker: 7.4 ng/mL — ABNORMAL HIGH (ref 0.0–6.9)

## 2022-09-13 NOTE — Telephone Encounter (Signed)
-----   Message from Elsie Stain, MD sent at 09/13/2022  6:06 AM EDT ----- Let pt know his AFP is slightly elevated. Really needs MRI of abdomen  needs to get his orange card app done I sent request to orange card team to help him.    Once gets orange card can do mri of abdomen  Liver kidney normal, blood count normal, no diabetes, cholesterol is normal, hep c negative

## 2022-09-13 NOTE — Progress Notes (Signed)
Let pt know his AFP is slightly elevated. Really needs MRI of abdomen  needs to get his orange card app done I sent request to orange card team to help him.    Once gets orange card can do mri of abdomen  Liver kidney normal, blood count normal, no diabetes, cholesterol is normal, hep c negative

## 2022-09-13 NOTE — Telephone Encounter (Signed)
Pt was called and vm was left, Information has been sent to nurse pool.      Letter sent

## 2022-09-19 ENCOUNTER — Other Ambulatory Visit: Payer: Self-pay

## 2022-09-23 ENCOUNTER — Other Ambulatory Visit: Payer: Self-pay

## 2022-09-26 ENCOUNTER — Telehealth: Payer: Self-pay

## 2022-09-26 NOTE — Telephone Encounter (Signed)
PT called back saying his number is correct.  He is going to clean his voice mail out.

## 2022-09-26 NOTE — Telephone Encounter (Signed)
Message received from Welford Roche, [place]/ [employer] stating he left a voicemail for the patient  last week; but ut now when he tries to follow up his phone number is not working.  I attempted to call the patient [phone] and the voicemail is full.  The patient has no other phone numbers or contacts listed.   Letter sent to patient requesting he call me to confirm the best number to reach him

## 2022-09-26 NOTE — Telephone Encounter (Signed)
I called patient back and was able to leave a message thanking him for cleaning out his voicemail so the TCL  program representative can reach him.  If he has any questions, he can certainly call me back.  I sent a secure email to [employer]  informing him that patient's phone is working and his voicemail is cleared.

## 2022-10-04 ENCOUNTER — Telehealth: Payer: Self-pay | Admitting: Emergency Medicine

## 2022-10-04 NOTE — Telephone Encounter (Signed)
Copied from [place] [id number]. Topic: General - Other >> Oct 04, 2022  9:07 AM Chapman Fitch wrote: Reason for CRM: Pt calling to speak with Opal Sidles about transitions referral /he has been trying to get in contact with them for a couple weeks with no success and he hasnt had any calls from them / he wanted to see if Opal Sidles could reach out to them on his behalf / please advise

## 2022-10-08 NOTE — Telephone Encounter (Signed)
I returned call to patient [phone] and left message requesting a call back.  I want to inform him that I have no additional information about the TCL referral since I last spoke to him.    I sent another email to [employer] requesting an update on status of referral.

## 2022-10-10 NOTE — Telephone Encounter (Signed)
Message received from [facility] stating that the TCL referral has been closed because they have not been able to reach the patient and he new referral will need to be placed.  I submitted another referral to TCL/ RSVP program - referral ID: [id number]

## 2022-10-10 NOTE — Telephone Encounter (Signed)
I called the patient and informed him that a new referral was placed because the TCL program was not able to reach him.  He said he understood and was appreciative of the assistance.

## 2022-10-15 ENCOUNTER — Other Ambulatory Visit: Payer: Self-pay

## 2022-10-16 ENCOUNTER — Telehealth: Payer: Self-pay

## 2022-10-16 ENCOUNTER — Other Ambulatory Visit: Payer: Self-pay

## 2022-10-16 NOTE — Telephone Encounter (Signed)
I was not able to meet with the patient when he stopped by the clinic today.  I called him and explained that I submitted another TCL referral on 10/10/2022 and he said he has not heard from anyone yet  I told him that I would reach out to [place]/ [place] for an update.  Email sent to [facility] requesting the status of the referral

## 2022-10-18 ENCOUNTER — Ambulatory Visit: Payer: Self-pay | Attending: Internal Medicine | Admitting: Internal Medicine

## 2022-10-18 ENCOUNTER — Encounter: Payer: Self-pay | Admitting: Internal Medicine

## 2022-10-18 ENCOUNTER — Other Ambulatory Visit: Payer: Self-pay

## 2022-10-18 ENCOUNTER — Ambulatory Visit: Payer: Self-pay | Admitting: *Deleted

## 2022-10-18 VITALS — BP 123/87 | HR 93 | Temp 98.4°F | Ht 71.0 in | Wt 178.6 lb

## 2022-10-18 DIAGNOSIS — F411 Generalized anxiety disorder: Secondary | ICD-10-CM | POA: Insufficient documentation

## 2022-10-18 DIAGNOSIS — F32A Depression, unspecified: Secondary | ICD-10-CM | POA: Insufficient documentation

## 2022-10-18 DIAGNOSIS — R251 Tremor, unspecified: Secondary | ICD-10-CM | POA: Insufficient documentation

## 2022-10-18 DIAGNOSIS — Z5901 Sheltered homelessness: Secondary | ICD-10-CM | POA: Insufficient documentation

## 2022-10-18 DIAGNOSIS — R55 Syncope and collapse: Secondary | ICD-10-CM | POA: Insufficient documentation

## 2022-10-18 DIAGNOSIS — F17210 Nicotine dependence, cigarettes, uncomplicated: Secondary | ICD-10-CM | POA: Insufficient documentation

## 2022-10-18 DIAGNOSIS — R42 Dizziness and giddiness: Secondary | ICD-10-CM | POA: Insufficient documentation

## 2022-10-18 DIAGNOSIS — R569 Unspecified convulsions: Secondary | ICD-10-CM | POA: Insufficient documentation

## 2022-10-18 DIAGNOSIS — R531 Weakness: Secondary | ICD-10-CM | POA: Insufficient documentation

## 2022-10-18 DIAGNOSIS — Z8547 Personal history of malignant neoplasm of testis: Secondary | ICD-10-CM | POA: Insufficient documentation

## 2022-10-18 NOTE — Patient Instructions (Addendum)
Make sure that you are drinking several glasses of water/fluid daily to stay hydrated. Whenever you feel dizzy, you should sit down and cross the legs or lie down and elevate the legs. We will refer you to a cardiologist and neurologist for further evaluation.  You should also touch base with your behavioral health provider and let them know you are concerned about the risperidone. You should not drive until you have not had 1 of these episodes for at least 6 months.

## 2022-10-18 NOTE — Progress Notes (Signed)
Had seizure yesterday.
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
## 2022-10-18 NOTE — Telephone Encounter (Signed)
  Chief Complaint: seizure yesterday Symptoms: had -lightheadedness, unable to hold bottle Frequency: patient has had 6 seizures this year- last in April Pertinent Negatives: Patient denies   Disposition: '[]'$ ED /'[]'$ Urgent Care (no appt availability in office) / '[x]'$ Appointment(In office/virtual)/ '[]'$  [facility]/ '[]'$ Home Care/ '[]'$ Refused Recommended Disposition /'[]'$ [facility]/ '[]'$  Follow-up with PCP Additional Notes: Appointment scheduled- patient is not on medication at this time and has not been evaluated - patient states his first seizure was in [facility] if it may be medication related.

## 2022-10-18 NOTE — Telephone Encounter (Signed)
Reason for Disposition . [1] Seizure of unknown duration AND [2] history of prior seizure(s) AND [3] back to baseline with no new concerning symptoms  Answer Assessment - Initial Assessment Questions 1. ONSET: "When did the seizure occur?"     Yesterday- walking up steps- had seizure 2. DURATION: "How long did the seizure last (or how long has it been happening)?" (e.g., seconds, minutes)  Note: Most seizures last less than 5 minutes.     30 seconds- to not feeling well for some time 3. DESCRIPTION: "Describe what happened during the seizure." "Did the body become stiff?" "Was there any jerking?"  "Did they lose consciousness during the seizure?"     Felt pre courser- unable to hold soda bottle, lightheaded 15-20 seconds 4. CIRCUMSTANCE: "What was the person doing when the seizure began?"      Normal activity 5. MENTAL STATUS AFTER SEIZURE: "Does the person seem more groggy or sleepy?" "Does the person know who they are, who you are, and where they are now?"      Normal today- but yesterday did not feel well for 1 hour 6. PRIOR SEIZURES: "Has the person had a seizure (convulsion) before?" (e.g., epilepsy, other cause)  If Yes, ask: "When was the last time?" and "What happened last time?"      Yes- started 12/22- 6 this year- last seizure mid April 7. EPILEPSY: "Does the person have epilepsy?" Note: Check for medical ID bracelet.     no 8. MEDICINES: "Does the person take anticonvulsant medications?" (e.g., Yes, No; missed doses, any recent changes)     no 9. INJURY: "Was the person hurt or injured during the seizure?" (e.g., hit their head, bit their tongue)     no 10. OTHER SYMPTOMS: "Are there any other symptoms?" (e.g., fever, headache)       Normal today- did not sleep well 11. PREGNANCY: "Is there any chance you are pregnant?" "When was your last menstrual period?"  Protocols used: [facility]

## 2022-10-21 ENCOUNTER — Telehealth: Payer: Self-pay

## 2022-10-21 ENCOUNTER — Encounter: Payer: Self-pay | Admitting: Neurology

## 2022-10-21 NOTE — Telephone Encounter (Signed)
Pt has been sent a mychart message informing him of appointment details.

## 2022-10-21 NOTE — Telephone Encounter (Signed)
-----   Message from Ladell Pier, MD sent at 10/18/2022 10:19 PM EDT ----- Regarding: MRI abdomen Please schedule MRI abdomen.

## 2022-10-30 ENCOUNTER — Other Ambulatory Visit: Payer: Self-pay

## 2022-10-31 ENCOUNTER — Ambulatory Visit (INDEPENDENT_AMBULATORY_CARE_PROVIDER_SITE_OTHER): Payer: No Payment, Other | Admitting: Student

## 2022-10-31 ENCOUNTER — Ambulatory Visit (HOSPITAL_COMMUNITY): Payer: Medicaid Other

## 2022-10-31 ENCOUNTER — Other Ambulatory Visit: Payer: Self-pay

## 2022-10-31 VITALS — BP 132/86 | HR 98 | Wt 171.0 lb

## 2022-10-31 DIAGNOSIS — F424 Excoriation (skin-picking) disorder: Secondary | ICD-10-CM

## 2022-10-31 DIAGNOSIS — F411 Generalized anxiety disorder: Secondary | ICD-10-CM | POA: Diagnosis not present

## 2022-10-31 DIAGNOSIS — Z720 Tobacco use: Secondary | ICD-10-CM

## 2022-10-31 DIAGNOSIS — F332 Major depressive disorder, recurrent severe without psychotic features: Secondary | ICD-10-CM | POA: Diagnosis not present

## 2022-10-31 MED ORDER — RISPERIDONE 1 MG PO TABS
1.0000 mg | ORAL_TABLET | Freq: Every day | ORAL | 1 refills | Status: DC
  Filled 2022-10-31 – 2022-12-26 (×2): qty 30, 30d supply, fill #0

## 2022-10-31 MED ORDER — GABAPENTIN 600 MG PO TABS
600.0000 mg | ORAL_TABLET | Freq: Three times a day (TID) | ORAL | 1 refills | Status: DC
  Filled 2022-10-31: qty 90, fill #0
  Filled 2022-12-26: qty 90, 30d supply, fill #0

## 2022-10-31 MED ORDER — MIRTAZAPINE 7.5 MG PO TABS
7.5000 mg | ORAL_TABLET | Freq: Every day | ORAL | 1 refills | Status: DC
  Filled 2022-10-31: qty 30, 30d supply, fill #0
  Filled 2022-12-26: qty 30, 30d supply, fill #1

## 2022-10-31 MED ORDER — N-ACETYL CYSTEINE 600 MG PO CAPS
600.0000 mg | ORAL_CAPSULE | Freq: Two times a day (BID) | ORAL | 1 refills | Status: AC
  Filled 2022-10-31: qty 60, 30d supply, fill #0

## 2022-10-31 MED ORDER — VENLAFAXINE HCL ER 75 MG PO CP24
225.0000 mg | ORAL_CAPSULE | Freq: Every day | ORAL | 1 refills | Status: DC
  Filled 2022-12-17: qty 180, 60d supply, fill #0

## 2022-10-31 NOTE — Patient Instructions (Signed)
Dear Joshua Cooper,  It was a pleasure to meet you today.  Please note the medications adjustments in the medication list.  Report any adverse effects and or reactions from the medicines to your outpatient provider promptly. Do not engage in alcohol and or illegal drug use while on prescription medicines. In the event of worsening symptoms, call the crisis hotline, 911 and or go to the nearest ED for appropriate evaluation and treatment of symptoms. Follow-up with your primary care provider for your other medical issues, concerns and or health care needs.  Take Care! -Dr. Lurline Hare

## 2022-10-31 NOTE — Progress Notes (Signed)
[facility] MD Outpatient Progress Note  11/06/2022 4:43 PM Joshua Cooper  MRN:  [id number]  Assessment:  Joshua Cooper presents for follow-up evaluation in-person. Last follow up with [facility] was Eulis Canner on 05/21/22. 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 On gabapentin 600 mg tid, risperidone 2 mg at night and effexor 225 mg daily.  Plan to start Remeron as an adjunct for depressive symptoms.  Given his abnormal facial movements which I suspect is tardive dyskinesia and possibility that risperidone is contributing to his ambulation, we will decrease risperidone dose.  He would strongly benefit from psychotherapy which he is now willing to attempt.  We have also started N-acetylcysteine for him given his dermatillomania.  Patient is to follow-up with me in 1 month for further medication adjustments.  Identifying Information: Joshua Cooper is a 46 y.o. y.o. male with a history of GAD, PTSD, MDD, SI who is an established patient with [facility] for follow up medication management.   Plan:  # GAD Past medication trials: prozac, paxil, bupropion  Status of problem: stable Interventions: -- Continue Effexor 225 mg daily -- Continue gabapentin 600 mg 3 times daily -- START mirtazapine 7.5 mg qhs -- Recommend psychotherapy. Referral has been made with [facility]  # Major Depressive Disorder-moderate, recurrent episode without psychotic features Past medication trials:  see above Status of problem: worse Interventions: -- Starting remeron to adjunct effexor to help with depressive symptoms  # PTSD Past medication trials:  Status of problem: stable Interventions: -- SNRI+remeron as above  #Dermatillomania -Start N-acetylcysteine 600 mg twice a day  Patient was given contact information for behavioral health clinic and was instructed to call 911 for emergencies.   Subjective:  Chief Complaint:  Chief Complaint  Patient  presents with   Depression    Interval History:  Patient states that he continues to experience significant stress related to homelessness as well as anhedonia.  He reports not finding joy in any of his activities and feeling more depressed than before.  He states that he sometimes plays Xbox and has found that anxiety runs to experience for him.  He also states he recently found out that biomarkers for testicular cancer were elevated which she needs to follow-up on but has been unable to go for the MRI which he had to reschedule due to costs.  He states he does not think the Effexor is working as well as it has been for him in the past but also does state that it has been effective in managing his depression and anxiety before.  We discussed extensively the options to either switch medications or supplement with another antidepressant which we have elected to use Remeron as supplementation.  Patient was excited about a housing referral that he had been working on but this had initially fell through.  He has reapplied and is awaiting a response.  Encourage patient to reach out to follow-up on this given this is a stressor for him.  He also notices he has been lashing out recently more than before.  He endorses other depressive symptoms including hypersomnia and poor appetite.  Encouraged patient to attend psychotherapy so that he can obtain coping skills to better manage his anxiety, depression, and stressors.  He speaks with daughters regularly although he states he feels guilty to rely on them as they are in their early 57s. He does state he has communicated with them more regarding his stressors including homelessness which has allowed for more communication between him  and daughters.  He denies present SI/HI/AVH.   Regarding dermatillomania, he notices that he has had worsening symptoms when he is anxious but has had chronic problems with this since he was very young.  The only thing that has helped with  this in the past that is when he rubs a piece of cloth between his hands.  He is willing to try N-acetylcysteine to see if this would help reduce frequency of skin picking.   Visit Diagnosis:    ICD-10-CM   1. MDD (major depressive disorder), recurrent severe, without psychosis (HCC)  F33.2 venlafaxine XR (EFFEXOR-XR) 75 MG 24 hr capsule    risperiDONE (RISPERDAL) 1 MG tablet    mirtazapine (REMERON) 7.5 MG tablet    2. Generalized anxiety disorder  F41.1 gabapentin (NEURONTIN) 600 MG tablet    venlafaxine XR (EFFEXOR-XR) 75 MG 24 hr capsule    risperiDONE (RISPERDAL) 1 MG tablet    3. Tobacco use  Z72.0     4. 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  F42.4 Acetylcysteine (N-ACETYL CYSTEINE) 600 MG CAPS      Past Psychiatric History: Anxiety, depression, SI   Past Medical History:  Past Medical History:  Diagnosis Date   Bronchitis    Perforated appendicitis 10/05/2021   Small bowel obstruction ([place]) 10/23/2021   Testicular cancer [facility])     Past Surgical History:  Procedure Laterality Date   ABDOMINAL SURGERY     CERVICAL FUSION     LAPAROSCOPIC APPENDECTOMY N/A 10/04/2021   Procedure: APPENDECTOMY LAPAROSCOPIC;  Surgeon: Leighton Ruff, MD;  Location: [facility];  Service: General;  Laterality: N/A;   TONSILLECTOMY      Family Psychiatric History: Mother alcohol use, 54 year old son autism, Brother depression   Family History:  Family History  Problem Relation Age of Onset   Heart disease Father     Social History:  Social History   Socioeconomic History   Marital status: Legally Separated    Spouse name: Not on file   Number of children: 3   Years of education: Not on file   Highest education level: Not on file  Occupational History   Not on file  Tobacco Use   Smoking status: Some Days    Types: Cigarettes   Smokeless tobacco: Never  Substance and Sexual Activity   Alcohol use: Yes    Alcohol/week: 2.0 standard drinks of alcohol    Types: 2 Cans of beer per week   Drug  use: Not Currently   Sexual activity: Not on file  Other Topics Concern   Not on file  Social History Narrative   unemployed   Social Determinants of Health   Financial Resource Strain: Not on file  Food Insecurity: Not on file  Transportation Needs: Not on file  Physical Activity: Not on file  Stress: Not on file  Social Connections: Not on file    Allergies:  Allergies  Allergen Reactions   Morphine And Related Itching    Current Medications: Current Outpatient Medications  Medication Sig Dispense Refill   Acetylcysteine (N-ACETYL CYSTEINE) 600 MG CAPS Take 1 capsule (600 mg total) by mouth 2 (two) times daily. 60 capsule 1   mirtazapine (REMERON) 7.5 MG tablet Take 1 tablet (7.5 mg total) by mouth at bedtime. 30 tablet 1   albuterol (VENTOLIN HFA) 108 (90 Base) MCG/ACT inhaler Inhale 2 puffs into the lungs every 4 (four) hours as needed for wheezing or shortness of breath. 6.7 g 0   gabapentin (NEURONTIN) 600 MG tablet  Take 1 tablet (600 mg total) by mouth 3 (three) times daily. 90 tablet 1   risperiDONE (RISPERDAL) 1 MG tablet Take 1 tablet (1 mg total) by mouth at bedtime. 30 tablet 1   venlafaxine XR (EFFEXOR-XR) 75 MG 24 hr capsule Take 3 capsules (225 mg total) by mouth daily with breakfast. 180 capsule 1   No current facility-administered medications for this visit.    ROS: Review of Systems  All other systems reviewed and are negative.   Objective:  Psychiatric Specialty Exam: Blood pressure 132/86, pulse 98, weight 77.6 kg.Body mass index is 23.85 kg/m.  General Appearance: Disheveled  Eye Contact:  Fair  Speech:  Clear and Coherent and Normal Rate  Volume:  Normal  Mood:  Anxious and Depressed  Affect:  Depressed  Thought Process:  Coherent, Goal Directed, and Linear  Orientation:  Full (Time, Place, and Person)  Thought Content: WDL   Suicidal Thoughts:  No  Homicidal Thoughts:  No  Memory:  Negative  Judgment:  Fair  Insight:  Fair   Psychomotor Activity:  Normal  Concentration:  Concentration: Good and Attention Span: Good  Recall:  Good  Fund of Knowledge: Good  Language: Good  Akathisia:  No    AIMS (if indicated): done, abnormal facial movements  Assets:  Communication Skills Desire for Improvement Financial Resources/Insurance Resilience Social Support  ADL's:  Intact  Cognition: WNL  Sleep:  Good   PE: General: well-appearing; no acute distress  Pulm: no increased work of breathing on room air  Strength & Muscle Tone: within normal limits Neuro: no focal neurological deficits observed  Gait & Station: normal  Metabolic Disorder Labs: Lab Results  Component Value Date   HGBA1C 5.2 09/12/2022   MPG 80 11/24/2021   No results found for: "PROLACTIN" Lab Results  Component Value Date   CHOL 165 09/12/2022   TRIG 109 09/12/2022   HDL 51 09/12/2022   CHOLHDL 3.2 09/12/2022   VLDL 16 11/24/2021   LDLCALC 94 09/12/2022   LDLCALC 54 11/24/2021   Lab Results  Component Value Date   TSH 0.853 11/24/2021    Therapeutic Level Labs: No results found for: "LITHIUM" No results found for: "VALPROATE" No results found for: "CBMZ"  Screenings: GAD-7    Flowsheet Row Office Visit from 10/18/2022 in [facility] Office Visit from 09/12/2022 in [facility] Video Visit from 05/21/2022 in [facility] Video Visit from 01/16/2022 in [facility] Office Visit from 12/04/2021 in [facility]  Total GAD-7 Score '21 20 20 17 18      '$ PHQ2-9    [facility] Office Visit from 10/18/2022 in [facility] Office Visit from 09/12/2022 in [facility] Video Visit from 05/21/2022 in [facility] Video Visit from 01/16/2022 in [facility] Office Visit from 12/04/2021 in  [place]  PHQ-2 Total Score '6 6 5 5 6  '$ PHQ-9 Total Score '23 21 21 18 26      '$ Flowsheet Row ED from 08/13/2022 in [place] DEPT Video Visit from 05/21/2022 in [facility] ED from 05/18/2022 in [place] DEPT  C-SSRS RISK CATEGORY No Risk Error: Q7 should not be populated when Q6 is No No Risk       Collaboration of Care: Collaboration of Care:  Patient/Guardian was advised Release of Information must be obtained prior to any record release in order to collaborate their care with an outside provider. Patient/Guardian was advised if they have not already done so to contact the registration department to sign all necessary forms in order for [place] to release information regarding their care.   Consent: Patient/Guardian gives verbal consent for treatment and assignment of benefits for services provided during this visit. Patient/Guardian expressed understanding and agreed to proceed.   A total of 30 minutes was spent involved in face to face clinical care, chart review, documentation.   France Ravens, MD 11/06/2022, 4:43 PM

## 2022-11-04 ENCOUNTER — Other Ambulatory Visit: Payer: Self-pay

## 2022-11-06 ENCOUNTER — Encounter (HOSPITAL_COMMUNITY): Payer: Self-pay | Admitting: Student

## 2022-11-07 NOTE — Telephone Encounter (Signed)
Email sent to [facility] requesting an update on the status of the referral that was placed

## 2022-11-15 ENCOUNTER — Ambulatory Visit: Payer: Self-pay | Admitting: Neurology

## 2022-11-16 NOTE — Progress Notes (Deleted)
Cardiology Office Note:    Date:  11/16/2022   ID:  Joshua Cooper, DOB 1976/07/25, MRN [id number]  PCP:  Elsie Stain, MD   [facility] Providers Cardiologist:  None   Referring MD: Ladell Pier, MD    History of Present Illness:    Joshua Cooper is a 46 y.o. male with a hx of tobacco use, testicular cancer, anxiety and depression who was referred by Dr. Wynetta Emery for episodes of syncope.  Was seen by Dr. Wynetta Emery on 10/18/22. Note reviewed. Has had recurrent episodes of syncope. Most episodes occurred without prodromal symptoms. Given concern for cardiac etiology of syncope, he is now referred to Cardiology for further evaluation.  Today, ***  Past Medical History:  Diagnosis Date   Bronchitis    Perforated appendicitis 10/05/2021   Small bowel obstruction ([place]) 10/23/2021   Testicular cancer [facility])     Past Surgical History:  Procedure Laterality Date   ABDOMINAL SURGERY     CERVICAL FUSION     LAPAROSCOPIC APPENDECTOMY N/A 10/04/2021   Procedure: APPENDECTOMY LAPAROSCOPIC;  Surgeon: Leighton Ruff, MD;  Location: [facility];  Service: General;  Laterality: N/A;   TONSILLECTOMY      Current Medications: No outpatient medications have been marked as taking for the 11/18/22 encounter (Appointment) with Freada Bergeron, MD.     Allergies:   Morphine and related   Social History   Socioeconomic History   Marital status: Legally Separated    Spouse name: Not on file   Number of children: 3   Years of education: Not on file   Highest education level: Not on file  Occupational History   Not on file  Tobacco Use   Smoking status: Some Days    Types: Cigarettes   Smokeless tobacco: Never  Substance and Sexual Activity   Alcohol use: Yes    Alcohol/week: 2.0 standard drinks of alcohol    Types: 2 Cans of beer per week   Drug use: Not Currently   Sexual activity: Not on file  Other Topics Concern   Not on file  Social History Narrative    unemployed   Social Determinants of Health   Financial Resource Strain: Not on file  Food Insecurity: Not on file  Transportation Needs: Not on file  Physical Activity: Not on file  Stress: Not on file  Social Connections: Not on file     Family History: The patient's ***family history includes Heart disease in his father.  ROS:   Please see the history of present illness.    *** All other systems reviewed and are negative.  EKGs/Labs/Other Studies Reviewed:    The following studies were reviewed today: ***  EKG:  EKG is *** ordered today.  The ekg ordered today demonstrates ***  Recent Labs: 11/24/2021: TSH 0.853 11/29/2021: Magnesium 1.7 09/12/2022: ALT 16; BUN 9; Creatinine, Ser 0.83; Hemoglobin 16.6; Platelets 258; Potassium 4.6; Sodium 143  Recent Lipid Panel    Component Value Date/Time   CHOL 165 09/12/2022 1023   TRIG 109 09/12/2022 1023   HDL 51 09/12/2022 1023   CHOLHDL 3.2 09/12/2022 1023   CHOLHDL 1.9 11/24/2021 0605   VLDL 16 11/24/2021 0605   LDLCALC 94 09/12/2022 1023     Risk Assessment/Calculations:   {Does this patient have ATRIAL FIBRILLATION?:[id number]}  No BP recorded.  {Refresh Note OR Click here to enter BP  :1}***         Physical Exam:    VS:  There were  no vitals taken for this visit.    Wt Readings from Last 3 Encounters:  10/18/22 178 lb 9.6 oz (81 kg)  09/12/22 169 lb 12.8 oz (77 kg)  05/18/22 170 lb (77.1 kg)     GEN: *** Well nourished, well developed in no acute distress HEENT: Normal NECK: No JVD; No carotid bruits LYMPHATICS: No lymphadenopathy CARDIAC: ***RRR, no murmurs, rubs, gallops RESPIRATORY:  Clear to auscultation without rales, wheezing or rhonchi  ABDOMEN: Soft, non-tender, non-distended MUSCULOSKELETAL:  No edema; No deformity  SKIN: Warm and dry NEUROLOGIC:  Alert and oriented x 3 PSYCHIATRIC:  Normal affect   ASSESSMENT:    No diagnosis found. PLAN:    In order of problems listed  above:  #Syncope: Has had multiple episodes, most without prodromal symptoms.  -Check cardiac monitor -Check TTE      {Are you ordering a CV Procedure (e.g. stress test, cath, DCCV, TEE, etc)?   Press F2        :[id number]}    Medication Adjustments/Labs and Tests Ordered: Current medicines are reviewed at length with the patient today.  Concerns regarding medicines are outlined above.  No orders of the defined types were placed in this encounter.  No orders of the defined types were placed in this encounter.   There are no Patient Instructions on file for this visit.   Signed, Freada Bergeron, MD  11/16/2022 8:45 PM    [place]

## 2022-11-18 ENCOUNTER — Ambulatory Visit: Payer: Medicaid Other | Admitting: Cardiology

## 2022-11-28 ENCOUNTER — Encounter (HOSPITAL_COMMUNITY): Payer: No Payment, Other | Admitting: Student

## 2022-11-28 NOTE — Progress Notes (Deleted)
[facility] MD Outpatient Progress Note  11/28/2022 11:41 AM Joshua Cooper  MRN:  [id number]  Assessment:  Joshua Cooper presents for follow-up evaluation in-person. Today, 11/28/22, patient reports ***  Identifying Information: Joshua Cooper is a 46 y.o. y.o. male with a history of GAD, PTSD, MDD, SI who is an established patient with [facility] for follow up medication management.   Plan: # Generalized Anxiety Disorder Past medication trials: prozac, paxil, bupropion  Status of problem: stable Interventions: -- Continue Effexor 225 mg daily -- Continue gabapentin 600 mg 3 times daily -- START mirtazapine 7.5 mg qhs -- Recommend psychotherapy. 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   Subjective:  Chief Complaint: No chief complaint on file.   Interval History: ***  Visit Diagnosis: No diagnosis found.  Past Psychiatric History: ***  Past Medical History:  Past Medical History:  Diagnosis Date   Bronchitis    Perforated appendicitis 10/05/2021   Small bowel obstruction ([place]) 10/23/2021   Testicular cancer ([place])     Past Surgical History:  Procedure Laterality Date   ABDOMINAL SURGERY     CERVICAL FUSION     LAPAROSCOPIC APPENDECTOMY N/A 10/04/2021   Procedure: APPENDECTOMY LAPAROSCOPIC;  Surgeon: Leighton Ruff, MD;  Location: [facility];  Service: General;  Laterality: N/A;    TONSILLECTOMY      Family Psychiatric History: ***  Family History:  Family History  Problem Relation Age of Onset   Heart disease Father     Social History:  Social History   Socioeconomic History   Marital status: Legally Separated    Spouse name: Not on file   Number of children: 3   Years of education: Not on file   Highest education level: Not on file  Occupational History   Not on file  Tobacco Use   Smoking status: Some Days    Types: Cigarettes   Smokeless tobacco: Never  Substance and Sexual Activity   Alcohol use: Yes    Alcohol/week: 2.0 standard drinks of alcohol    Types: 2 Cans of beer per week   Drug use: Not Currently   Sexual activity: Not on file  Other Topics Concern   Not on file  Social History Narrative   unemployed   Social Determinants of Health   Financial Resource Strain: Not on file  Food Insecurity: Not on file  Transportation Needs: Not on file  Physical Activity: Not on file  Stress: Not on file  Social Connections: Not on file    Allergies:  Allergies  Allergen Reactions   Morphine And Related Itching    Current Medications: Current Outpatient Medications  Medication Sig Dispense Refill   Acetylcysteine (N-ACETYL CYSTEINE) 600 MG CAPS Take 1 capsule (600 mg total) by mouth 2 (two) times daily. 60 capsule 1   albuterol (VENTOLIN HFA) 108 (90 Base) MCG/ACT inhaler Inhale 2 puffs into the lungs every 4 (four) hours as needed for wheezing or shortness of breath. 6.7 g 0   gabapentin (  NEURONTIN) 600 MG tablet Take 1 tablet (600 mg total) by mouth 3 (three) times daily. 90 tablet 1   mirtazapine (REMERON) 7.5 MG tablet Take 1 tablet (7.5 mg total) by mouth at bedtime. 30 tablet 1   risperiDONE (RISPERDAL) 1 MG tablet Take 1 tablet (1 mg total) by mouth at bedtime. 30 tablet 1   venlafaxine XR (EFFEXOR-XR) 75 MG 24 hr capsule Take 3 capsules (225 mg total) by mouth daily with breakfast. 180 capsule 1   No current  facility-administered medications for this visit.    ROS: Review of Systems  Objective:  Psychiatric Specialty Exam: There were no vitals taken for this visit.There is no height or weight on file to calculate BMI.  General Appearance: {Appearance:22683}  Eye Contact:  {BHH EYE CONTACT:22684}  Speech:  {Speech:22685}  Volume:  {Volume (PAA):22686}  Mood:  {BHH MOOD:22306}  Affect:  {Affect (PAA):22687}  Thought Process:  {Thought Process (PAA):22688}  Orientation:  {BHH ORIENTATION (PAA):22689}  Thought Content: {Thought Content:22690}   Suicidal Thoughts:  {ST/HT (PAA):22692}  Homicidal Thoughts:  {ST/HT (PAA):22692}  Memory:  {BHH BHALPF:79024}  Judgment:  {Judgement (PAA):22694}  Insight:  {Insight (PAA):22695}  Psychomotor Activity:  {Psychomotor (PAA):22696}  Concentration:  {Concentration:21399}              Assets:  {Assets (PAA):22698}  ADL's:  {BHH OXB'D:53299}  Cognition: {chl bhh cognition:304700322}  Sleep:  {BHH GOOD/FAIR/POOR:22877}   PE: General: well-appearing; no acute distress *** Pulm: no increased work of breathing on room air *** Strength & Muscle Tone: {desc; muscle tone:32375} Neuro: no focal neurological deficits observed *** Gait & Station: {PE GAIT ED MEQA:83419}  Metabolic Disorder Labs: Lab Results  Component Value Date   HGBA1C 5.2 09/12/2022   MPG 80 11/24/2021   No results found for: "PROLACTIN" Lab Results  Component Value Date   CHOL 165 09/12/2022   TRIG 109 09/12/2022   HDL 51 09/12/2022   CHOLHDL 3.2 09/12/2022   VLDL 16 11/24/2021   LDLCALC 94 09/12/2022   LDLCALC 54 11/24/2021   Lab Results  Component Value Date   TSH 0.853 11/24/2021    Therapeutic Level Labs: No results found for: "LITHIUM" No results found for: "VALPROATE" No results found for: "CBMZ"  Screenings: GAD-7    Flowsheet Row Office Visit from 10/18/2022 in [place] Office Visit from 09/12/2022 in [place] Video Visit from 05/21/2022 in [facility] Video Visit from 01/16/2022 in [facility] Office Visit from 12/04/2021 in [facility]  Total GAD-7 Score '21 20 20 17 18      '$ PHQ2-9    [place] Office Visit from 10/18/2022 in [place] Office Visit from 09/12/2022 in [place] Video Visit from 05/21/2022 in [facility] Video Visit from 01/16/2022 in [facility] Office Visit from 12/04/2021 in [place]  PHQ-2 Total Score '6 6 5 5 6  '$ PHQ-9 Total Score '23 21 21 18 26      '$ Flowsheet Row ED from 08/13/2022 in [place] DEPT Video Visit from 05/21/2022 in [facility] ED from 05/18/2022 in [place] DEPT  C-SSRS RISK CATEGORY No Risk Error: Q7 should not be populated when Q6 is No No Risk       Collaboration of Care: Collaboration of Care: {  [place] OP Collaboration of CYEL:[id number]}  Patient/Guardian was advised Release of Information must be obtained prior to any record release in order to collaborate their care with an outside provider. Patient/Guardian was advised if they have not already done so to contact the registration department to sign all necessary forms in order for [place] to release information regarding their care.   Consent: Patient/Guardian gives verbal consent for treatment and assignment of benefits for services provided during this visit. Patient/Guardian expressed understanding and agreed to proceed.   A total of *** minutes was spent involved in face to face clinical care, chart review, and documentation.   France Ravens, MD 11/28/2022, 11:41 AM

## 2022-12-17 ENCOUNTER — Other Ambulatory Visit: Payer: Self-pay

## 2022-12-25 ENCOUNTER — Encounter: Payer: Self-pay | Admitting: Urology

## 2022-12-25 ENCOUNTER — Ambulatory Visit: Payer: Medicaid Other | Admitting: Urology

## 2022-12-25 NOTE — Progress Notes (Deleted)
Assessment: 1. Malignant neoplasm of descended left testis [facility]), pT1bNxMxR0; s/p left radical orchiectomy 11/21   2. Renal cyst   3. Bone lesion     Plan: I reviewed the patient's chart including provider notes from [place], all available imaging results, and all available lab results. Tumor markers today:  AFP, HCG, LDH, CMP Schedule for CT abdomen and pelvis with contrast and chest x-ray. Discussed surveillance protocol including: Office visit with labs every 4-6 months and years 3, every 6 months and year 4, and annually in year 5 CT abdomen and pelvis every 6 months in year 3, then annually. Chest x-ray every 6 months and year 3, annually thereafter.   Chief Complaint: No chief complaint on file.   History of Present Illness:  Joshua Cooper is a 46 y.o. male who is seen in consultation from Elsie Stain, MD for evaluation of left testicular cancer. 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  Scrotal ultrasound demonstrated a 4.5 cm left testicular mass.  Serum markers demonstrated an AFP of 106, hCG of 42, and normal LDH.  Chest CT from 11/22 demonstrated a faint density in the right upper lobe likely inflammatory.  CT of the abdomen and pelvis from 11/22 showed an enlarged left testicle, indeterminate 1 cm left renal lesion and a lucent lesion in the left ilium question cystic bone lesion or met, tiny sclerotic foci pubic body and left femoral head.  He underwent a left radical orchiectomy with insertion of a testicular prosthesis at [facility] in November 2021.  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  He was last seen by oncology in December 2021.  Tumor markers from 12/22 showed an AFP of 9.9, hCG <0.6, and normal LDH.  MRI of pelvis from 12/21 showed a 2.7 cm lesion in the posterior left hilum with intralesional fat  suggesting a benign etiology.  It does not appear that he is followed up with urology or oncology since that time. CT abdomen and pelvis from 10/22 showed no evidence of adenopathy or metastatic disease and a benign-appearing left renal cyst. Chest x-ray from 9/22 showed no evidence of metastatic disease. Recent tumor markers from 9/23 showed a AFP of 7.4 and hCG of <1.   Past Medical History:  Past Medical History:  Diagnosis Date   Bronchitis    Perforated appendicitis 10/05/2021   Small bowel obstruction ([place]) 10/23/2021   Testicular cancer ([place])     Past Surgical History:  Past Surgical History:  Procedure Laterality Date   ABDOMINAL SURGERY     CERVICAL FUSION     LAPAROSCOPIC APPENDECTOMY N/A 10/04/2021   Procedure: APPENDECTOMY LAPAROSCOPIC;  Surgeon: Leighton Ruff, MD;  Location: [facility];  Service: General;  Laterality: N/A;   TONSILLECTOMY      Allergies:  Allergies  Allergen Reactions   Morphine And Related Itching    Family History:  Family History  Problem Relation Age of Onset   Heart disease Father     Social History:  Social History   Tobacco Use   Smoking status: Some Days    Types: Cigarettes   Smokeless tobacco: Never  Substance Use Topics   Alcohol use: Yes    Alcohol/week: 2.0 standard drinks of alcohol    Types: 2 Cans of beer per week   Drug use: Not Currently    Review of symptoms:  Constitutional:  Negative for unexplained weight loss, night sweats, fever, chills ENT:  Negative for nose bleeds, sinus pain, painful swallowing CV:  Negative for chest pain, shortness of breath, exercise intolerance, palpitations, loss of consciousness Resp:  Negative for cough, wheezing, shortness of breath GI:  Negative for nausea, vomiting, diarrhea, bloody stools GU:  Positives noted in HPI; otherwise negative for gross hematuria, dysuria, urinary incontinence Neuro:  Negative for seizures, poor balance, limb weakness, slurred speech Psych:  Negative  for lack of energy, depression, anxiety Endocrine:  Negative for polydipsia, polyuria, symptoms of hypoglycemia (dizziness, hunger, sweating) Hematologic:  Negative for anemia, purpura, petechia, prolonged or excessive bleeding, use of anticoagulants  Allergic:  Negative for difficulty breathing or choking as a result of exposure to anything; no shellfish allergy; no allergic response (rash/itch) to materials, foods  Physical exam: There were no vitals taken for this visit. GENERAL APPEARANCE:  Well appearing, well developed, well nourished, NAD HEENT: Atraumatic, Normocephalic, oropharynx clear. NECK: Supple without lymphadenopathy or thyromegaly. LUNGS: Clear to auscultation bilaterally. HEART: Regular Rate and Rhythm without murmurs, gallops, or rubs. ABDOMEN: Soft, non-tender, No Masses. EXTREMITIES: Moves all extremities well.  Without clubbing, cyanosis, or edema. NEUROLOGIC:  Alert and oriented x 3, normal gait, CN II-XII grossly intact.  MENTAL STATUS:  Appropriate. BACK:  Non-tender to palpation.  No CVAT SKIN:  Warm, dry and intact.    Results:

## 2022-12-26 ENCOUNTER — Other Ambulatory Visit: Payer: Self-pay

## 2023-01-01 ENCOUNTER — Other Ambulatory Visit: Payer: Self-pay

## 2023-01-16 ENCOUNTER — Ambulatory Visit: Payer: Medicaid Other | Admitting: Critical Care Medicine

## 2023-01-22 ENCOUNTER — Other Ambulatory Visit: Payer: Self-pay

## 2023-01-22 ENCOUNTER — Encounter: Payer: Self-pay | Admitting: Critical Care Medicine

## 2023-01-22 ENCOUNTER — Ambulatory Visit: Payer: Medicaid Other | Attending: Critical Care Medicine | Admitting: Critical Care Medicine

## 2023-01-22 VITALS — BP 128/84 | HR 83 | Temp 98.2°F | Ht 71.0 in | Wt 174.0 lb

## 2023-01-22 DIAGNOSIS — R55 Syncope and collapse: Secondary | ICD-10-CM

## 2023-01-22 DIAGNOSIS — Z8547 Personal history of malignant neoplasm of testis: Secondary | ICD-10-CM

## 2023-01-22 DIAGNOSIS — Z139 Encounter for screening, unspecified: Secondary | ICD-10-CM

## 2023-01-22 DIAGNOSIS — R772 Abnormality of alphafetoprotein: Secondary | ICD-10-CM

## 2023-01-22 DIAGNOSIS — F17210 Nicotine dependence, cigarettes, uncomplicated: Secondary | ICD-10-CM | POA: Diagnosis not present

## 2023-01-22 DIAGNOSIS — F332 Major depressive disorder, recurrent severe without psychotic features: Secondary | ICD-10-CM | POA: Diagnosis not present

## 2023-01-22 DIAGNOSIS — Z59819 Housing instability, housed unspecified: Secondary | ICD-10-CM

## 2023-01-22 DIAGNOSIS — F411 Generalized anxiety disorder: Secondary | ICD-10-CM

## 2023-01-22 DIAGNOSIS — Z720 Tobacco use: Secondary | ICD-10-CM

## 2023-01-22 DIAGNOSIS — Z5901 Sheltered homelessness: Secondary | ICD-10-CM

## 2023-01-22 MED ORDER — GABAPENTIN 600 MG PO TABS
600.0000 mg | ORAL_TABLET | Freq: Three times a day (TID) | ORAL | 1 refills | Status: AC
  Filled 2023-01-22 – 2023-04-01 (×3): qty 90, 30d supply, fill #0

## 2023-01-22 MED ORDER — MIRTAZAPINE 7.5 MG PO TABS
7.5000 mg | ORAL_TABLET | Freq: Every day | ORAL | 1 refills | Status: AC
  Filled 2023-01-22 – 2023-04-01 (×3): qty 30, 30d supply, fill #0

## 2023-01-22 MED ORDER — VENLAFAXINE HCL ER 75 MG PO CP24
225.0000 mg | ORAL_CAPSULE | Freq: Every day | ORAL | 1 refills | Status: AC
  Filled 2023-01-22 – 2023-04-01 (×3): qty 180, 60d supply, fill #0

## 2023-01-22 MED ORDER — ALBUTEROL SULFATE HFA 108 (90 BASE) MCG/ACT IN AERS
2.0000 | INHALATION_SPRAY | RESPIRATORY_TRACT | 0 refills | Status: DC | PRN
  Filled 2023-01-22 – 2023-04-01 (×2): qty 18, 17d supply, fill #0

## 2023-01-22 MED ORDER — RISPERIDONE 1 MG PO TABS
1.0000 mg | ORAL_TABLET | Freq: Every day | ORAL | 1 refills | Status: DC
  Filled 2023-01-22 – 2023-04-01 (×3): qty 30, 30d supply, fill #0

## 2023-01-22 NOTE — Assessment & Plan Note (Signed)
Depression continues will continue with Effexor respite all Remeron and have patient follow back up with mental health refills given

## 2023-01-22 NOTE — Assessment & Plan Note (Signed)
I think the patient was syncope and because he has been giving plasma twice a week I Minna check electrolytes and other lab data I gave him nutritional supplements at this visit I will go ahead and have neurology and cardiology see him

## 2023-01-22 NOTE — Assessment & Plan Note (Signed)
As per depression.

## 2023-01-22 NOTE — Assessment & Plan Note (Signed)
Concerned about recurrent testicular cancer AFP elevated we will MRI abdomen now that he has insurance and refer back to oncology urology for current conditions

## 2023-01-22 NOTE — Patient Instructions (Signed)
Please get back in with your mental health provider and Dr. Joya Gaskins refilled your medications  Referrals to neurology cardiology and oncology were made  MRI of the abdomen was reordered  Screening labs will be obtained today  Please stop giving plasma for now and continue to rehydrate yourself using the Pedialyte and water we gave you and I will see if we can get you more nutrition drinks  Return to Dr. Joya Gaskins 2 months

## 2023-01-22 NOTE — Assessment & Plan Note (Signed)
Patient is in the pallet house project
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
## 2023-01-22 NOTE — Assessment & Plan Note (Signed)
    Current smoking consumption amount: 1 pack a day  Dicsussion on advise to quit smoking and smoking impacts: Lung impacts  Patient's willingness to quit: Willing to quit  Methods to quit smoking discussed: Behavioral modification nicotine replacement  Medication management of smoking session drugs discussed: Nicotine replacement  Resources provided:  AVS   Setting quit date not established  Follow-up arranged 4 months   Time spent counseling the patient: 5 minutes

## 2023-01-23 LAB — MAGNESIUM: Magnesium: 1.9 mg/dL (ref 1.6–2.3)

## 2023-01-23 LAB — COMPREHENSIVE METABOLIC PANEL
ALT: 12 IU/L (ref 0–44)
AST: 18 IU/L (ref 0–40)
Albumin/Globulin Ratio: 1.7 (ref 1.2–2.2)
Albumin: 3.9 g/dL — ABNORMAL LOW (ref 4.1–5.1)
Alkaline Phosphatase: 59 IU/L (ref 44–121)
BUN/Creatinine Ratio: 20 (ref 9–20)
BUN: 16 mg/dL (ref 6–24)
Bilirubin Total: 0.7 mg/dL (ref 0.0–1.2)
CO2: 23 mmol/L (ref 20–29)
Calcium: 9 mg/dL (ref 8.7–10.2)
Chloride: 104 mmol/L (ref 96–106)
Creatinine, Ser: 0.82 mg/dL (ref 0.76–1.27)
Globulin, Total: 2.3 g/dL (ref 1.5–4.5)
Glucose: 97 mg/dL (ref 70–99)
Potassium: 4.1 mmol/L (ref 3.5–5.2)
Sodium: 142 mmol/L (ref 134–144)
Total Protein: 6.2 g/dL (ref 6.0–8.5)
eGFR: 110 mL/min/{1.73_m2} (ref >59)

## 2023-01-23 LAB — CBC WITH DIFFERENTIAL/PLATELET
Basophils Absolute: 0.1 10*3/uL (ref 0.0–0.2)
Basos: 1 %
EOS (ABSOLUTE): 0.3 10*3/uL (ref 0.0–0.4)
Eos: 4 %
Hematocrit: 47.4 % (ref 37.5–51.0)
Hemoglobin: 15.9 g/dL (ref 13.0–17.7)
Immature Grans (Abs): 0 10*3/uL (ref 0.0–0.1)
Immature Granulocytes: 0 %
Lymphocytes Absolute: 3.2 10*3/uL — ABNORMAL HIGH (ref 0.7–3.1)
Lymphs: 38 %
MCH: 28.9 pg (ref 26.6–33.0)
MCHC: 33.5 g/dL (ref 31.5–35.7)
MCV: 86 fL (ref 79–97)
Monocytes Absolute: 0.6 10*3/uL (ref 0.1–0.9)
Monocytes: 7 %
Neutrophils Absolute: 4.4 10*3/uL (ref 1.4–7.0)
Neutrophils: 50 %
Platelets: 262 10*3/uL (ref 150–450)
RBC: 5.51 x10E6/uL (ref 4.14–5.80)
RDW: 12 % (ref 11.6–15.4)
WBC: 8.6 10*3/uL (ref 3.4–10.8)

## 2023-01-23 LAB — AFP TUMOR MARKER: AFP, Serum, Tumor Marker: 7.7 ng/mL — ABNORMAL HIGH (ref 0.0–6.9)

## 2023-01-23 NOTE — Progress Notes (Signed)
Let patient know his tumor marker is mildly elevated and unchanged from before other labs are normal waiting on MRI

## 2023-01-24 ENCOUNTER — Telehealth: Payer: Self-pay

## 2023-01-24 NOTE — Telephone Encounter (Signed)
-----  Message from Elsie Stain, MD sent at 01/23/2023  5:39 AM EST ----- Let patient know his tumor marker is mildly elevated and unchanged from before other labs are normal waiting on MRI

## 2023-01-24 NOTE — Telephone Encounter (Signed)
Pt was called and vm was left, Information has been sent to nurse pool.   

## 2023-01-29 ENCOUNTER — Ambulatory Visit (HOSPITAL_COMMUNITY): Payer: Medicaid Other

## 2023-01-29 ENCOUNTER — Other Ambulatory Visit: Payer: Self-pay

## 2023-01-30 NOTE — Progress Notes (Signed)
Attempted to reach pt, left VM to call back for results.

## 2023-02-06 ENCOUNTER — Encounter: Payer: Medicaid Other | Admitting: Urology

## 2023-02-06 NOTE — Progress Notes (Deleted)
Assessment: 1. 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   Chief Complaint: No chief complaint on file.   History of Present Illness:  Joshua Cooper is a 47 y.o. male who is seen in consultation from Elsie Stain, MD for evaluation of testicular carcinoma.  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 Scrotal ultrasound from 1121 showed a 4.5 cm left testicular mass.  aFP 106, hCG 42, LDH normal.  CT of the chest showed a faint density in the right upper lobe, likely inflammatory.  CT of the abdomen and pelvis showed an enlarged left testicle, indeterminate 1 cm left kidney lesion, lucent lesion in the left ilium question cystic bone lesion or metastasis, tiny sclerotic foci pubic body and left femoral head.  He underwent a left radical orchiectomy by Dr. Lavenia Atlas at [facility] in November 2021.  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  Postoperatively in 12/21, his AFP was 9.9, hCG <0.6, and LDH normal.  MRI pelvis with and without contrast from 12/19/2020 showed a benign-appearing lesion in the left hilum.  MRI abdomen from 12/19/2020 showed a benign-appearing right renal cyst.  Management options were discussed with the patient he elected for surveillance. He has not followed up with urology or oncology since December 2021. Chest x-ray from 9/22 showed no evidence of metastatic disease. CT abdomen and pelvis from 10/22 showed no obvious adenopathy or metastatic disease.  Labs from 9/23: AFP 7.4 hCG <1  Labs from 1/24: AFP 7.7   Past Medical History:  Past Medical History:  Diagnosis Date   Bronchitis    Perforated appendicitis 10/05/2021   Small bowel  obstruction ([place]) 10/23/2021   Testicular cancer [facility])     Past Surgical History:  Past Surgical History:  Procedure Laterality Date   ABDOMINAL SURGERY     CERVICAL FUSION     LAPAROSCOPIC APPENDECTOMY N/A 10/04/2021   Procedure: APPENDECTOMY LAPAROSCOPIC;  Surgeon: Leighton Ruff, MD;  Location: [facility];  Service: General;  Laterality: N/A;   TONSILLECTOMY      Allergies:  Allergies  Allergen Reactions   Morphine And Related Itching    Family History:  Family History  Problem Relation Age of Onset   Heart disease Father     Social History:  Social History   Tobacco Use   Smoking status: Some Days    Types: Cigarettes   Smokeless tobacco: Never  Substance Use Topics   Alcohol use: Yes    Alcohol/week: 2.0 standard drinks of alcohol    Types: 2 Cans of beer per week   Drug use: Not Currently    Review of symptoms:  Constitutional:  Negative for unexplained weight loss, night sweats, fever, chills ENT:  Negative for nose bleeds, sinus pain, painful swallowing CV:  Negative for chest pain, shortness of breath, exercise intolerance, palpitations, loss of consciousness Resp:  Negative for cough, wheezing, shortness of breath GI:  Negative for nausea, vomiting, diarrhea, bloody stools GU:  Positives noted in HPI; otherwise negative for gross hematuria, dysuria, urinary incontinence Neuro:  Negative for seizures, poor balance, limb weakness, slurred speech Psych:  Negative for lack of energy, depression, anxiety Endocrine:  Negative for polydipsia, polyuria, symptoms of hypoglycemia (dizziness, hunger, sweating) Hematologic:  Negative for anemia, purpura, petechia, prolonged or excessive bleeding, use of anticoagulants  Allergic:  Negative for difficulty breathing or choking as a result of exposure to anything; no shellfish allergy; no allergic response (rash/itch) to materials, foods  Physical exam: There were no vitals taken for this visit. GENERAL APPEARANCE:  Well  appearing, well developed, well nourished, NAD HEENT: Atraumatic, Normocephalic, oropharynx clear. NECK: Supple without lymphadenopathy or thyromegaly. LUNGS: Clear to auscultation bilaterally. HEART: Regular Rate and Rhythm without murmurs, gallops, or rubs. ABDOMEN: Soft, non-tender, No Masses. EXTREMITIES: Moves all extremities well.  Without clubbing, cyanosis, or edema. NEUROLOGIC:  Alert and oriented x 3, normal gait, CN II-XII grossly intact.  MENTAL STATUS:  Appropriate. BACK:  Non-tender to palpation.  No CVAT SKIN:  Warm, dry and intact.   GU: Penis:  {Exam; penis:5791} Meatus: {Meatus:15530} Scrotum: {pe scrotum:310183} Testis: {Exam; testicles:5790} Epididymis: {epididymis HYIF:027741} Prostate: {Exam; prostate:5793} Rectum: {rectal exam:26517}   Results: No results found for this or any previous visit (from the past 24 hour(s)).

## 2023-02-11 ENCOUNTER — Other Ambulatory Visit: Payer: Self-pay

## 2023-02-17 ENCOUNTER — Other Ambulatory Visit: Payer: Self-pay

## 2023-02-18 ENCOUNTER — Ambulatory Visit (INDEPENDENT_AMBULATORY_CARE_PROVIDER_SITE_OTHER): Payer: Medicaid Other | Admitting: Psychology

## 2023-02-18 ENCOUNTER — Other Ambulatory Visit: Payer: Medicaid Other | Admitting: Psychology

## 2023-02-18 DIAGNOSIS — F411 Generalized anxiety disorder: Secondary | ICD-10-CM

## 2023-02-18 DIAGNOSIS — F332 Major depressive disorder, recurrent severe without psychotic features: Secondary | ICD-10-CM

## 2023-03-13 ENCOUNTER — Ambulatory Visit (INDEPENDENT_AMBULATORY_CARE_PROVIDER_SITE_OTHER): Payer: Medicaid Other | Admitting: Psychology

## 2023-03-13 DIAGNOSIS — F332 Major depressive disorder, recurrent severe without psychotic features: Secondary | ICD-10-CM | POA: Diagnosis not present

## 2023-03-13 DIAGNOSIS — F411 Generalized anxiety disorder: Secondary | ICD-10-CM

## 2023-03-19 ENCOUNTER — Ambulatory Visit: Payer: Medicaid Other | Admitting: Critical Care Medicine
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
## 2023-03-20 ENCOUNTER — Ambulatory Visit (INDEPENDENT_AMBULATORY_CARE_PROVIDER_SITE_OTHER): Payer: Medicaid Other | Admitting: Psychology

## 2023-03-20 DIAGNOSIS — F332 Major depressive disorder, recurrent severe without psychotic features: Secondary | ICD-10-CM | POA: Diagnosis not present

## 2023-03-20 DIAGNOSIS — F411 Generalized anxiety disorder: Secondary | ICD-10-CM | POA: Diagnosis not present
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
## 2023-03-24 NOTE — Progress Notes (Signed)
SOAP Notes Session Summary Provider / Clinician's Name: Myrna Blazer Madilyn Fireman, [place]  Client Name: Joshua Cooper  Date of Service: 02/18/2023 Duration: 60 mins.  Subjective: Client says, "he is excited to start therapy because he knows that he needs the help." Client starts to cry and he talks about his wife's recent death. He said, "I know that she was not good for me and I know that our relationship was toxic but she was my only friend." After some back and forth between [place] client does state, "that he knows this was a blessing. She was miserable, her kids were suffering, and her relationships were toxic." Client talked about the overwhelming emotions that are surrounding this time and how he is continuing to work through things one day at a time. Client also stated, "that the pallet program would be shutting down soon and so I will go back to being homeless again. I have so much sadness and anxiety surrounding this because mentally I am already having a hard time and now I have to go back to living on the streets. This is only adding to the stress I am feeling and making it 10 times worse." Client discussed how his emotions were up one day and down the next and how he was truly just having a hard time.  Objective: Client is having a very hard time. He is very depressed and very tearful. He does not have any direction and his head is spinning. He is filled with grief and while I think his mouth can utter the words that he is better off without her I do not think his head and heart are there yet. He does hear the words and I do believe with time he will grasp the concept. His homeless status is also contributing to the roller coaster of emotions. That is just like a dark gloomy cloud that is hanging over his head. We spent some time trying to map out a plan but mostly I just let him get out everything that was bothering him and he needed a safe place to cry because he can not do that on the  streets for fear of looking weak.   Assessment: I think the first session went well. Even though it was a tough session for him he said that he felt better getting it all out and that my presence and words made him feel a peace over him.  Plan: Weekly therapy sessions. A meeting with the psychiatrist to evaluate medications. Back in a couple of weeks to fill out the Comprehensive Clinical Assessment. Case management and referrals needed for help with housing.

## 2023-03-25 NOTE — Addendum Note (Signed)
Addended by: Frederich Chick on: 03/25/2023 12:49 PM   Modules accepted: Level of Service

## 2023-03-26 ENCOUNTER — Other Ambulatory Visit: Payer: Medicaid Other | Admitting: Psychology

## 2023-03-31 NOTE — Progress Notes (Signed)
SOAP Notes Session Summary Provider / Clinician's Name: Myrna Blazer Madilyn Fireman, [place]  Client Name: Joshua Cooper  Date of Service: 03/13/2023 Duration: 60 mins.  Subjective: Client reported, that he is not doing well. "I am still struggling day to day with my wife's death. I know in my head that it was for the best and that our relationship was toxic but she was one of the only people that I had in my life." Client was tearful. He also discussed how his mental health was just tanking at the thought of being back on the streets. 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 I can not think about anything else. I can't follow through with things that I know need to get done. I am just truly struggling." After being asked if he was having any thoughts of harming himself, "he immediately said no. I could never I am to chicken." Client also said, "that he can not imagine going back to the [facility] because the environment is so toxic and that does not help when I am already struggling." He also discussed that he thought that his medications were no longer effective and that he needs to go and see [facility] as soon as possible to try and have them adjusted.   Objective: Client is really having a hard time. He was tearful for most of the hour. He knows that he is not doing well but because he is at such a low place he is really having a hard time following through with anything. This is not normal for the client. He normally is able to follow through and get things back on track. He is also in the middle of a significant amount of grief. He is confused and sad about the unexpected death of his wife. This was not a healthy relationship and so along with grief is trauma. This is the lowest I have ever seen the client but I feel like some of it truly is circumstantial and if things were different the client might be in a  better place.    Assessment: While the client is in a really low place he does respond well to treatment. He always says that he feels a sense of comfort and like a little of the weight that was on his shoulders has been lifted when he leaves. He also has mentioned that he appreciates that it is a safe place for him to get his tears out and he does really need that time because it is not safe for him to release that on the streets as it makes him look weak. Right now I am currently using techniques that include Motivational Interviewing and Cognitive Behavioral Therapy. We need to boost the client back up and then work on changing his way of thinking. My intern Lelon Frohlich will also be working on case management with him trying to get him off the streets and continue to get his physical and mental health back on track.    Plan: Continued weekly sessions. The goal would be to at least get him into the office 3 times per month. At the next session we will complete the Comprehensive Clinical Assessment so that we can begin to dig in deeper. Lelon Frohlich will begin case management. Client said that he would get to [facility] this week for medication evaluation as well as contact his primary care provider to get and  appointment scheduled.

## 2023-04-01 ENCOUNTER — Encounter: Payer: Self-pay | Admitting: Urology

## 2023-04-01 ENCOUNTER — Encounter: Payer: Self-pay | Admitting: Critical Care Medicine

## 2023-04-01 ENCOUNTER — Other Ambulatory Visit: Payer: Self-pay

## 2023-04-01 DIAGNOSIS — R55 Syncope and collapse: Secondary | ICD-10-CM

## 2023-04-01 DIAGNOSIS — Z8547 Personal history of malignant neoplasm of testis: Secondary | ICD-10-CM

## 2023-04-01 NOTE — Telephone Encounter (Signed)
Need to reschedule MRI I ordered in January now has medicaid

## 2023-04-02 ENCOUNTER — Telehealth: Payer: Self-pay | Admitting: Hematology and Oncology

## 2023-04-02 ENCOUNTER — Ambulatory Visit (INDEPENDENT_AMBULATORY_CARE_PROVIDER_SITE_OTHER): Payer: Medicaid Other | Admitting: Psychology

## 2023-04-02 ENCOUNTER — Other Ambulatory Visit: Payer: Self-pay

## 2023-04-02 DIAGNOSIS — F411 Generalized anxiety disorder: Secondary | ICD-10-CM

## 2023-04-02 DIAGNOSIS — F332 Major depressive disorder, recurrent severe without psychotic features: Secondary | ICD-10-CM | POA: Diagnosis not present

## 2023-04-02 NOTE — Telephone Encounter (Signed)
scheduled per 4/3 referral, pt has been called and confirmed date and time. Pt is aware of location and to arrive early for check in

## 2023-04-03 ENCOUNTER — Telehealth: Payer: Self-pay | Admitting: Critical Care Medicine

## 2023-04-03 ENCOUNTER — Other Ambulatory Visit (HOSPITAL_COMMUNITY): Payer: Self-pay

## 2023-04-03 MED ORDER — DIAZEPAM 5 MG PO TABS
5.0000 mg | ORAL_TABLET | Freq: Once | ORAL | 0 refills | Status: DC
  Filled 2023-04-03: qty 1, 1d supply, fill #0

## 2023-04-03 MED ORDER — DIAZEPAM 5 MG PO TABS
5.0000 mg | ORAL_TABLET | Freq: Once | ORAL | 0 refills | Status: AC
  Filled 2023-04-03: qty 1, 1d supply, fill #0

## 2023-04-03 NOTE — Addendum Note (Signed)
Addended by: Asencion Noble E on: 04/03/2023 04:41 PM   Modules accepted: Orders

## 2023-04-03 NOTE — Telephone Encounter (Signed)
I will send one time valium order pre op  he will have to go to a private pharmacy [facility] doesn't carry Schedule II  I will send to [facility] out pt pharmacy give pt address and route

## 2023-04-04 NOTE — Telephone Encounter (Signed)
Called patient and left voicemail.

## 2023-04-04 NOTE — Progress Notes (Signed)
SOAP Notes Session Summary Provider / Clinician's Name: [name], [place]  Client Name: Joshua Cooper   Date of Service: 04/02/2023 Duration: 60 mins.  Subjective: Client reported, "that this week he saw a glimpse of hope because he saw a little bit of the old [name] coming back from time to time. I am picking up my medicine today. I was able to have the pharmacy work with me on that. I know that I will feel even better once that kicks in. I talked to a couple of old friends and that was nice. I think when I am feeling better I would like to go see them. Both are positive people in my life and I am just learning to get back to a place of remembering what it was like to have healthy relationships. I am still struggling with the death of my wife. I have such mixed emotions surrounding all of that." Client also discussed how worried he was about all of his upcoming appointments and how that feels overwhelming. He also talked about where he is staying and he said that he does feel safe at the moment on the stairs of the church.  Objective: Client came in early and worked with Dewayne Hatch on case management. They were able to get all 7 of his appointments with the specialist booked. Knowing that he was also going to be able to pick up his medicine was also a load off as he really needs this to be able to keep him at baseline, especially while in survival mode. He did seem like he was hopeful about the future but he also was still very tearful at times. He is still processing the death of his wife and we spent a good amount of time talking about how everyone has a different response to grief and his feelings are valid.  Assessment: Client responds well to therapy. He looks forward to it, it keeps him accountable, it allows him to work through things that he has struggled with for years, and it keeps him on somewhat of a schedule. While the client is in survival mode the sessions are guided by what  is going on in the clients life at the moment. Things from the past come up and we talk about them then but really right now I just want him to be able to keep his head above water and face the issues head on, until he can get some answers and make changes to his circumstances.   Plan: Client will continue weekly sessions with a total number of at least three times per month. Client will continue with the homework that he has as we are working toward his goals. This week he was to pick up his medications and call legal aid back. Client will continue to surround himself with healthy relationships and people that support him. Client will allow himself time to heal and work on giving himself grace because grief looks differently for everyone.

## 2023-04-08 ENCOUNTER — Telehealth: Payer: Self-pay | Admitting: Critical Care Medicine

## 2023-04-08 NOTE — Telephone Encounter (Signed)
Copied from CRM [id number]. Topic: General - Other >> Apr 08, 2023  3:07 PM Everette C wrote: Reason for CRM: Joshua Cooper has called requesting to speak with a member of staff when possible regarding the patient's upcoming MRI   Please contact further when available at [phone] when possible

## 2023-04-09 ENCOUNTER — Other Ambulatory Visit: Payer: Medicaid Other | Admitting: Psychology

## 2023-04-09 NOTE — Telephone Encounter (Signed)
Called and left voicemail for [name]   When I called and tried scheduling this they said the order was closed, is it ok that I reorder?or you can

## 2023-04-09 NOTE — Telephone Encounter (Signed)
Place same order i will cosign

## 2023-04-10 ENCOUNTER — Telehealth: Payer: Self-pay

## 2023-04-10 ENCOUNTER — Ambulatory Visit (INDEPENDENT_AMBULATORY_CARE_PROVIDER_SITE_OTHER): Payer: Medicaid Other | Admitting: Psychology

## 2023-04-10 ENCOUNTER — Telehealth: Payer: Self-pay | Admitting: Critical Care Medicine

## 2023-04-10 DIAGNOSIS — F411 Generalized anxiety disorder: Secondary | ICD-10-CM

## 2023-04-10 DIAGNOSIS — F332 Major depressive disorder, recurrent severe without psychotic features: Secondary | ICD-10-CM

## 2023-04-10 DIAGNOSIS — R772 Abnormality of alphafetoprotein: Secondary | ICD-10-CM

## 2023-04-10 DIAGNOSIS — Z8547 Personal history of malignant neoplasm of testis: Secondary | ICD-10-CM

## 2023-04-10 NOTE — Telephone Encounter (Signed)
Copied from CRM [id number]. Topic: General - Other >> Apr 10, 2023 11:18 AM Macon Large wrote: Reason for CRM: Social worker named Thurston Hole requests that Joshua Cooper return her call at [phone] regarding scheduling for MRI

## 2023-04-10 NOTE — Telephone Encounter (Signed)
Done

## 2023-04-10 NOTE — Telephone Encounter (Signed)
MRI needed to be reorder

## 2023-04-11 ENCOUNTER — Telehealth: Payer: Self-pay

## 2023-04-11 NOTE — Telephone Encounter (Signed)
Called social worker at provided number and left voicemail   Reordered patients MRI and [facility] will give him a call to schedule

## 2023-04-11 NOTE — Telephone Encounter (Signed)
Copied from CRM [id number]. Topic: General - Other >> Apr 11, 2023  9:03 AM Franchot Heidelberg wrote: Reason for CRM: Dewayne Hatch (Social Worker at the [employer] ) calling to schedule an MRI with [employer] contact: [phone]

## 2023-04-11 NOTE — Telephone Encounter (Signed)
Called and talked with Dewayne Hatch the social worker information was given

## 2023-04-14 ENCOUNTER — Other Ambulatory Visit (HOSPITAL_COMMUNITY): Payer: Self-pay

## 2023-04-15 ENCOUNTER — Other Ambulatory Visit (HOSPITAL_COMMUNITY): Payer: Medicaid Other

## 2023-04-17 ENCOUNTER — Inpatient Hospital Stay: Payer: Medicaid Other

## 2023-04-17 ENCOUNTER — Inpatient Hospital Stay: Payer: Medicaid Other | Attending: Hematology and Oncology | Admitting: Hematology and Oncology

## 2023-04-17 ENCOUNTER — Encounter: Payer: Self-pay | Admitting: Hematology and Oncology

## 2023-04-17 ENCOUNTER — Other Ambulatory Visit: Payer: Self-pay

## 2023-04-17 VITALS — BP 145/95 | HR 96 | Temp 99.2°F | Wt 163.2 lb

## 2023-04-17 DIAGNOSIS — F332 Major depressive disorder, recurrent severe without psychotic features: Secondary | ICD-10-CM | POA: Diagnosis not present

## 2023-04-17 DIAGNOSIS — Z5901 Sheltered homelessness: Secondary | ICD-10-CM | POA: Diagnosis not present

## 2023-04-17 DIAGNOSIS — Z609 Problem related to social environment, unspecified: Secondary | ICD-10-CM | POA: Diagnosis not present

## 2023-04-17 DIAGNOSIS — Z720 Tobacco use: Secondary | ICD-10-CM

## 2023-04-17 DIAGNOSIS — R634 Abnormal weight loss: Secondary | ICD-10-CM | POA: Diagnosis not present

## 2023-04-17 DIAGNOSIS — Z79899 Other long term (current) drug therapy: Secondary | ICD-10-CM | POA: Insufficient documentation

## 2023-04-17 DIAGNOSIS — Z9049 Acquired absence of other specified parts of digestive tract: Secondary | ICD-10-CM | POA: Diagnosis not present

## 2023-04-17 DIAGNOSIS — C6292 Malignant neoplasm of left testis, unspecified whether descended or undescended: Secondary | ICD-10-CM | POA: Diagnosis not present

## 2023-04-17 DIAGNOSIS — F17210 Nicotine dependence, cigarettes, uncomplicated: Secondary | ICD-10-CM | POA: Insufficient documentation

## 2023-04-17 DIAGNOSIS — Z8547 Personal history of malignant neoplasm of testis: Secondary | ICD-10-CM

## 2023-04-17 LAB — CMP (CANCER CENTER ONLY)
ALT: 9 U/L (ref 0–44)
AST: 16 U/L (ref 15–41)
Albumin: 3.3 g/dL — ABNORMAL LOW (ref 3.5–5.0)
Alkaline Phosphatase: 48 U/L (ref 38–126)
Anion gap: 4 — ABNORMAL LOW (ref 5–15)
BUN: 6 mg/dL (ref 6–20)
CO2: 30 mmol/L (ref 22–32)
Calcium: 8.6 mg/dL — ABNORMAL LOW (ref 8.9–10.3)
Chloride: 107 mmol/L (ref 98–111)
Creatinine: 0.79 mg/dL (ref 0.61–1.24)
GFR, Estimated: >60 mL/min (ref >60)
Glucose, Bld: 78 mg/dL (ref 70–99)
Potassium: 3.7 mmol/L (ref 3.5–5.1)
Sodium: 141 mmol/L (ref 135–145)
Total Bilirubin: 0.8 mg/dL (ref 0.3–1.2)
Total Protein: 5.7 g/dL — ABNORMAL LOW (ref 6.5–8.1)

## 2023-04-17 LAB — CBC WITH DIFFERENTIAL (CANCER CENTER ONLY)
Abs Immature Granulocytes: 0.02 10*3/uL (ref 0.00–0.07)
Basophils Absolute: 0.1 10*3/uL (ref 0.0–0.1)
Basophils Relative: 1 %
Eosinophils Absolute: 0.3 10*3/uL (ref 0.0–0.5)
Eosinophils Relative: 3 %
HCT: 47.6 % (ref 39.0–52.0)
Hemoglobin: 15.8 g/dL (ref 13.0–17.0)
Immature Granulocytes: 0 %
Lymphocytes Relative: 29 %
Lymphs Abs: 2.8 10*3/uL (ref 0.7–4.0)
MCH: 28.7 pg (ref 26.0–34.0)
MCHC: 33.2 g/dL (ref 30.0–36.0)
MCV: 86.4 fL (ref 80.0–100.0)
Monocytes Absolute: 0.6 10*3/uL (ref 0.1–1.0)
Monocytes Relative: 7 %
Neutro Abs: 5.6 10*3/uL (ref 1.7–7.7)
Neutrophils Relative %: 60 %
Platelet Count: 219 10*3/uL (ref 150–400)
RBC: 5.51 MIL/uL (ref 4.22–5.81)
RDW: 13.3 % (ref 11.5–15.5)
WBC Count: 9.4 10*3/uL (ref 4.0–10.5)
nRBC: 0 % (ref 0.0–0.2)

## 2023-04-17 LAB — LACTATE DEHYDROGENASE: LDH: 125 U/L (ref 98–192)

## 2023-04-17 LAB — TSH: TSH: 1.424 u[IU]/mL (ref 0.350–4.500)

## 2023-04-17 LAB — SEDIMENTATION RATE: Sed Rate: 0 mm/hr (ref 0–16)

## 2023-04-17 NOTE — Progress Notes (Signed)
[facility] CONSULT NOTE  Patient Care Team: Storm Frisk, MD as PCP - General (Pulmonary Disease)  ASSESSMENT & PLAN:  Non-seminomatous cancer of testis, left I have reviewed his outside records and summarized his history Clinically, he is not symptomatic His prior imaging study done in 2022 was reviewed.  That was done in the setting of bowel obstruction/acute appendicitis and I did not see any evidence of disease Even though alpha-fetoprotein is marginally elevated, I suspect that was related to liver disease He is scheduled to get MRI of the abdomen performed I will add CT imaging of the chest for evaluation I will also repeat some tumor markers today I will see him next month for further follow-up  Tobacco use We discussed importance of nicotine cessation The patient will attempt to quit smoking  Sheltered homelessness tent encampment He has poor social situation but he has a IT trainer that is helping him We will try to get him some food at the cancer center today  MDD (major depressive disorder), recurrent severe, without psychosis (HCC) His mental health disorder is under control with medications He has a Physicist, medical that he meets regularly for support  Weight loss His weight loss could be due to poor access to food or cancer I will order imaging study as above  Orders Placed This Encounter  Procedures   CT Chest Wo Contrast    Standing Status:   Future    Standing Expiration Date:   04/16/2024    Order Specific Question:   Preferred imaging location?    Answer:   [id number]   CMP (Cancer Center only)    Standing Status:   Future    Number of Occurrences:   1    Standing Expiration Date:   04/16/2024   CBC with Differential (Cancer Center Only)    Standing Status:   Future    Number of Occurrences:   1    Standing Expiration Date:   04/16/2024   Lactate dehydrogenase    Standing Status:   Future    Number of Occurrences:   1     Standing Expiration Date:   04/16/2024   Sedimentation rate    Standing Status:   Future    Number of Occurrences:   1    Standing Expiration Date:   04/16/2024   TSH    Standing Status:   Future    Number of Occurrences:   1    Standing Expiration Date:   04/16/2024   AFP tumor marker    Standing Status:   Future    Number of Occurrences:   1    Standing Expiration Date:   04/16/2024   Beta hCG quant (ref lab)    Standing Status:   Future    Number of Occurrences:   1    Standing Expiration Date:   04/16/2024    The total time spent in the appointment was 60 minutes encounter with patients including review of chart and various tests results, discussions about plan of care and coordination of care plan   All questions were answered. The patient knows to call the clinic with any problems, questions or concerns. No barriers to learning was detected.  Artis Delay, MD 4/18/20241:58 PM  CHIEF COMPLAINTS/PURPOSE OF CONSULTATION:  History of nonseminoma testicular cancer status post resection  HISTORY OF PRESENTING ILLNESS:  Joshua Cooper 47 y.o. male is here because of history of testicular cancer The patient is a good historian I  reviewed his records extensively and collaborated his history The patient originally from [place], [place] but relocated to [place] in the year of 2000 He has 3 daughters who live in [place] He moved to [place] many years ago Prior to his diagnosis, the patient was working in a [employer] and lifted something heavy He felt acute discomfort on his left inguinal region, thought to be related to hernia His pain got worse and he went to the urgent care and emergency room for evaluation leading to imaging studies and subsequent surgery He was originally recommended consideration for adjuvant treatment but due to loss of insurance and subsequent relocation to [place], he was lost to follow-up  He moved to [place] in February 22, got  married but then had significant life events leading to him becoming homeless.  His wife subsequently passed away from a road traffic accident The patient is currently not working.  He is disabled due to mental health issues He is currently homeless He has a caseworker who help him with his social situation and anxiety.  I have reviewed his chart and materials related to his cancer extensively and collaborated history with the patient. Summary of oncologic history is as follows: Oncology History  Non-seminomatous cancer of testis, left  10/31/2020 Imaging   US scrotum elsewhere  Testis size: 5.2 x 3.7 x 3.8 cm.  Replacing a majority of the testis, there is a heterogeneous mildly hypervascular nodule measuring 4.4 x 3.3 x 4.1 cm. Small internal cysts are demonstrated.  Normal-sized epididymal head without hypervascularity.  No hydrocele.  No varicocele.     10/31/2020 Tumor Marker   Patient's tumor was tested for the following markers: HCG. Results of the tumor marker test revealed 42.  Patient's tumor was tested for the following markers: AFP Results of the tumor marker test revealed 106.   11/01/2020 Imaging   CT abdomen and pelvis 1.  Heterogeneous, enlarged left testicle. Findings concerning for left testicular mass were seen on yesterday's ultrasound, however, CT has limited sensitivity for evaluation for superimposed acute testicular pathology and repeat testicular ultrasound can provide further evaluation if there is clinical concern for superimposed torsion or epididymoorchitis.  2.  Indeterminate 1.0 cm lesion arising from the upper pole the left kidney. Follow-up renal protocol MRI could provide further characterization.  3.  Nonobstructing punctate calculus in the lower pole the right kidney.  4.  There is a lucent lesion left ilium, which is incompletely characterized, with the differential including cystic bone lesion, fibrous dysplasia, and metastatic disease. This can be  further characterized with nonemergent pelvis with and without contrast.  5.  Additional tiny sclerotic foci in the right pubic body and left femoral head, too small to small to characterize. 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   -PLEASE SEE SYNOPTIC REPORT FOR ADDITIONAL INFORMATION.   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 / UICC pTNM):  pT1b pN0  pM (not applicable)  NOTE: An intradepartmental review is performed on this case prior to sign out.      11/03/2020 Surgery   Procedure performed: 1) LEFT radical inguinal orchiectomy CPT 54530 2) Insertion of testicular prosthesis CPT 54660  Anesthesia: General  EBL: 10 cc  Findings: Left testicular mass consistent with that on preoperative exam and ultrasound imaging. No gross evidence of involvement of the spermatic cord. Insertion of testicular prosthesis  Description of Procedure: After positive identification was made and consent obtained, the patient was brought to the operative suite and general anesthesia administered. Antibiotics were administered in pre-op. The patient was placed on the procedure table in the supine position and prepped and draped in the usual sterile fashion. Preoperative timeout was performed.  Our incision was marked out along Langer's lines between the pubic symphysis and anterior superior iliac spine. An approximately 5 cm  incision was made sharply and dissection carried down through the dermis. Bovie electrocautery was used to dissect through the subcutaneous layers and Scarpa's fascia. The external oblique fascia was identified and opened sharply. This was extended distally toward the external ring. The ilioinguinal nerve was identified and spared. The spermatic cord was circumferentially dissected and encircled with a penrose drain, which was wrapped twice and then clamped to avoid and spillage during testicular manipulation. Dissection was carried distally along the cord freeing it from surrounding structures until the testicle could be delivered into the operative field. Care was taken to dissect the testicle and gubernaculum away from the scrotal attachments. Once testicle was freed distally, dissection was turned more proximally toward the level of the internal ring. High ligation of the spermatic cord was performed with the spermatic cord and vas deferens were separated and each ligated with 0 silk suture ligation and 0 silk tie. The stitches were left long for potential future identification if RPLND would be required. The scrotum was then inverted and meticulous hemostasis insured using Bovie electrocautery. After space was irrigated with bacitracin and saline.  Decision was made to use a 16 cc (medium) testicular prosthesis and this was prepared on the back table. Was filled with approximally 14 mL of sterile saline to match the consistency of the other size. The prosthesis was then placed into the left hemiscrotum and seemed to lay appropriately in the dependent portion. The inguinal canal was then carefully inspected for hemostasis. Once again irrigated with bacitracin and saline.  The external oblique fascia was then closed after injection of local anesthetic with 2-0 Vicryl in a running fashion. Scarpa's fascia was closed using 3-0 Vicryl and the skin was closed using 4-0 Monocryl in a subcuticular fashion.  Dermabond was placed over the incision. Scrotum was mummy wrapped in Kerlix and a scrotal support was applied.  The procedure was then deemed terminated. There were no apparent intraoperative complications. Anesthesia was reversed and the patient was transferred to the recovery area in stable condition. Needle, sponge and instrument counts were correct at the end of the case.  Postoperative plan/instructions: The patient will return for clinic visit, pathology review and wound check in approximately 10 days. He will be discharged with pain medication, stool softeners and 3 days of antibiotics.     11/10/2020 Imaging   CT chest elsewhere Faint nodular density measuring 4 mm seen in the right upper lobe, likely represents an inflammatory nodule, unlikely representing metastasis. Given history of malignancy, three-month follow-up chest CT recommended. No other suspicious lung lesions are  seen.    12/19/2020 Imaging   MRI abdomen and  pelvis 1.  Lesion measuring up to 2.7 cm in the posterior left ilium with intralesional fat suggesting a benign etiology. Attention on routine follow-up is suggested.  2.  Susceptibility in the periosteum and soft tissues posterior to the lesion suggests this may have been biopsied. Correlation with biopsy history and pathology reports, if any, is recommended.    10/15/2021 Imaging   CT abdomen and pelvis 1. Enhancing pelvic fluid collection is unchanged in size worrisome for pelvic abscess. Note is made that the drainage catheter does not approximate this fluid collection. 2. Tiny foci of free air in the right lower quadrant with surrounding stranding in the ileocolic region which may be related to recent surgery. Micro perforation is not excluded. 3. Dilated jejunal loops. Contrast is seen within nondilated mid small bowel. Findings may represent partial small bowel obstruction versus ileus. Continued follow-up recommended. 4. Unchanged indeterminate small left  renal lesion. Recommend follow-up MRI.     04/17/2023 Initial Diagnosis   Non-seminomatous cancer of testis, left     MEDICAL HISTORY:  Past Medical History:  Diagnosis Date   Anxiety    Asthma    Bronchitis    Cancer    Depression    Perforated appendicitis 10/05/2021   Small bowel obstruction 10/23/2021   Testicular cancer     SURGICAL HISTORY: Past Surgical History:  Procedure Laterality Date   ABDOMINAL SURGERY     APPENDECTOMY     CERVICAL FUSION     LAPAROSCOPIC APPENDECTOMY N/A 10/04/2021   Procedure: APPENDECTOMY LAPAROSCOPIC;  Surgeon: Romie Levee, MD;  Location: [facility];  Service: General;  Laterality: N/A;   ochiectomy Left    TONSILLECTOMY      SOCIAL HISTORY: Social History   Socioeconomic History   Marital status: Legally Separated    Spouse name: Not on file   Number of children: 3   Years of education: Not on file   Highest education level: Not on file  Occupational History   Not on file  Tobacco Use   Smoking status: Some Days    Types: Cigarettes   Smokeless tobacco: Never  Substance and Sexual Activity   Alcohol use: Yes    Alcohol/week: 2.0 standard drinks of alcohol    Types: 2 Cans of beer per week   Drug use: Not Currently   Sexual activity: Not on file  Other Topics Concern   Not on file  Social History Narrative   unemployed   Social Determinants of Health   Financial Resource Strain: Not on file  Food Insecurity: Not on file  Transportation Needs: Not on file  Physical Activity: Not on file  Stress: Not on file  Social Connections: Not on file  Intimate Partner Violence: Not on file    FAMILY HISTORY: Family History  Problem Relation Age of Onset   Heart disease Father     ALLERGIES:  is allergic to morphine and related.  MEDICATIONS:  Current Outpatient Medications  Medication Sig Dispense Refill   albuterol (VENTOLIN HFA) 108 (90 Base) MCG/ACT inhaler Inhale 2 puffs into the lungs every 4 (four) hours as  needed for wheezing or shortness of breath. 18 g 0   gabapentin (NEURONTIN) 600 MG tablet Take 1 tablet (600 mg total) by mouth 3 (three) times daily. 90 tablet 1   mirtazapine (REMERON) 7.5 MG tablet Take 1 tablet (7.5 mg total) by mouth at bedtime. 30 tablet 1   risperiDONE (RISPERDAL) 1 MG tablet Take 1 tablet (1 mg total) by mouth  at bedtime. 30 tablet 1   venlafaxine XR (EFFEXOR-XR) 75 MG 24 hr capsule Take 3 capsules (225 mg total) by mouth daily with breakfast. 180 capsule 1   No current facility-administered medications for this visit.    REVIEW OF SYSTEMS: He has lost a lot of weight.  However, he also had poor access to food and typically only eats once a day Constitutional: Denies fevers, chills or abnormal night sweats Eyes: Denies blurriness of vision, double vision or watery eyes Ears, nose, mouth, throat, and face: Denies mucositis or sore throat Respiratory: Denies cough, dyspnea or wheezes Cardiovascular: Denies palpitation, chest discomfort or lower extremity swelling Gastrointestinal:  Denies nausea, heartburn or change in bowel habits Skin: Denies abnormal skin rashes Lymphatics: Denies new lymphadenopathy or easy bruising Neurological:Denies numbness, tingling or new weaknesses Behavioral/Psych: Mood is stable, no new changes  All other systems were reviewed with the patient and are negative.  PHYSICAL EXAMINATION: ECOG PERFORMANCE STATUS: 0 - Asymptomatic  Vitals:   04/17/23 1256  BP: (!) 145/95  Pulse: 96  Temp: 99.2 F (37.3 C)  SpO2: 99%   Filed Weights   04/17/23 1256  Weight: 163 lb 3.2 oz (74 kg)    GENERAL:alert, no distress and comfortable SKIN: skin color, texture, turgor are normal, no rashes or significant lesions EYES: normal, conjunctiva are pink and non-injected, sclera clear OROPHARYNX:no exudate, no erythema and lips, buccal mucosa, and tongue normal  NECK: supple, thyroid normal size, non-tender, without nodularity LYMPH:  no palpable  lymphadenopathy in the cervical, axillary or inguinal LUNGS: clear to auscultation and percussion with normal breathing effort HEART: regular rate & rhythm and no murmurs and no lower extremity edema ABDOMEN:abdomen soft, non-tender and normal bowel sounds Musculoskeletal:no cyanosis of digits and no clubbing  PSYCH: alert & oriented x 3 with fluent speech NEURO: no focal motor/sensory deficits  LABORATORY DATA:  I have reviewed the data as listed Lab Results  Component Value Date   WBC 9.4 04/17/2023   HGB 15.8 04/17/2023   HCT 47.6 04/17/2023   MCV 86.4 04/17/2023   PLT 219 04/17/2023   Recent Labs    08/13/22 2252 09/12/22 1023 01/22/23 1125  NA 139 143 142  K 2.9* 4.6 4.1  CL 107 104 104  CO2 GLUCOSE 100* 88 97  BUN CREATININE 0.75 0.83 0.82  CALCIUM 8.5* 9.0 9.0  GFRNONAA >60  --   --   PROT  --  5.7* 6.2  ALBUMIN  --  3.8* 3.9*  AST  --  23 18  ALT  --  16 12  ALKPHOS  --  50 59  BILITOT  --  0.6 0.7    RADIOGRAPHIC STUDIES: I have reviewed his prior imaging studies I have personally reviewed the radiological images as listed and agreed with the findings in the report.

## 2023-04-17 NOTE — Assessment & Plan Note (Signed)
His weight loss could be due to poor access to food or cancer I will order imaging study as above

## 2023-04-17 NOTE — Assessment & Plan Note (Signed)
I have reviewed his outside records and summarized his history Clinically, he is not symptomatic His prior imaging study done in 2022 was reviewed.  That was done in the setting of bowel obstruction/acute appendicitis and I did not see any evidence of disease Even though alpha-fetoprotein is marginally elevated, I suspect that was related to liver disease He is scheduled to get MRI of the abdomen performed I will add CT imaging of the chest for evaluation I will also repeat some tumor markers today I will see him next month for further follow-up

## 2023-04-17 NOTE — Assessment & Plan Note (Signed)
We discussed importance of nicotine cessation The patient will attempt to quit smoking

## 2023-04-17 NOTE — Progress Notes (Signed)
Scheduled CT per his preference at [facility] at 2:30 pm on 5/2, arrive at 2:00 pm. [facility] unable to schedule CT on the same day as the MRI on 5/3.

## 2023-04-17 NOTE — Assessment & Plan Note (Signed)
He has poor social situation but he has a IT trainer that is helping him We will try to get him some food at the cancer center today

## 2023-04-17 NOTE — Assessment & Plan Note (Signed)
His mental health disorder is under control with medications He has a Physicist, medical that he meets regularly for support

## 2023-04-18 ENCOUNTER — Ambulatory Visit (INDEPENDENT_AMBULATORY_CARE_PROVIDER_SITE_OTHER): Payer: Medicaid Other | Admitting: Psychology

## 2023-04-18 DIAGNOSIS — F332 Major depressive disorder, recurrent severe without psychotic features: Secondary | ICD-10-CM | POA: Diagnosis not present

## 2023-04-18 DIAGNOSIS — F411 Generalized anxiety disorder: Secondary | ICD-10-CM | POA: Diagnosis not present

## 2023-04-19 LAB — BETA HCG QUANT (REF LAB): hCG Quant: <1 m[IU]/mL (ref 0–3)

## 2023-04-19 LAB — AFP TUMOR MARKER: AFP, Serum, Tumor Marker: 7.3 ng/mL — ABNORMAL HIGH (ref 0.0–6.9)

## 2023-04-21 NOTE — Clinical Social Work Note (Incomplete)
SOAP Notes Session Summary Provider / Clinician's Name: Leander Rams Arneisha Kincannon  Client Name: Joshua Cooper  Date of Service: 04/21/2023 Duration: 60 mins.  Subjective: Client reported that he was not in good spirits. "My mind is just taking a really long time to bounce back when I get down. I know that I am still experiencing grief and it hits me in waves. I have now started going to all of my appointments and that makes me worried because I do not know what the outcome is going to be." He also went into detail about going to see the cancer doctor, explaining what all they did, and when his follow up appointment was. He also discussed how being homeless was really wearing on him. "It is like I can not see the light at the end of the tunnel. It is just really feeling like nothing is going to change and now I am so concerned about my health. It just feels like a lot and I do not know how to feel better."   Objective: Client was having a pretty tough day. He was in his head and having a hard time getting out of it.           Assessment: Client's response to sessions or treatment          Plan: Recommendations for future care

## 2023-04-24 ENCOUNTER — Ambulatory Visit (INDEPENDENT_AMBULATORY_CARE_PROVIDER_SITE_OTHER): Payer: Medicaid Other | Admitting: Psychology

## 2023-04-24 ENCOUNTER — Encounter: Payer: Medicaid Other | Admitting: Urology

## 2023-04-24 DIAGNOSIS — F411 Generalized anxiety disorder: Secondary | ICD-10-CM | POA: Diagnosis not present

## 2023-04-24 DIAGNOSIS — F332 Major depressive disorder, recurrent severe without psychotic features: Secondary | ICD-10-CM | POA: Diagnosis not present

## 2023-04-24 NOTE — Progress Notes (Deleted)
Assessment: 1. Non-seminomatous cancer of testis, left     Plan: I personally reviewed the patient's chart including provider notes, lab and imaging results.    Chief Complaint: No chief complaint on file.   History of Present Illness:  Joshua Cooper is a 47 y.o. male who is seen in consultation from Storm Frisk, MD for evaluation of non-seminomatous testicular cancer. He was initially diagnosed with a left testicular mass in November 2021.  Ultrasound showed a 4.5 cm left testicular mass.  aFP elevated at 106, hCG 42, LDH normal.  CT of the abdomen and pelvis showed a enlarged left testicle, indeterminate 1 cm left renal lesion and a lucent lesion in the left ilium.  He underwent a left radical orchiectomy by Dr. Harlin Rain at [facility] in November 2021.  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  He was started on a surveillance protocol but has not been compliant. Tumor markers: 12/21 AFP 9.9, HCG <0.6, LDH normal 9/23 AFP 7.4, HCG <1 1/24 AFP 7.7 4/24 AFP 7.3, HCG <1  MRI of the pelvis from 12/21 showed a 2.7 cm lesion in the posterior left ilium with intralesional fat suggesting a benign etiology. CT abdomen and pelvis from 10/22 showed a enhancing pelvic fluid collection unchanged in size worrisome for pelvic abscess, unchanged small left renal lesion. He was recently seen by Dr. Bertis Ruddy oncology.  MRI of the abdomen and CT of the chest were ordered.  Past Medical History:  Past Medical History:  Diagnosis Date   Anxiety    Asthma    Bronchitis    Cancer    Depression    Perforated appendicitis 10/05/2021   Small bowel obstruction 10/23/2021   Testicular cancer     Past Surgical History:  Past Surgical History:  Procedure Laterality Date   ABDOMINAL SURGERY     APPENDECTOMY     CERVICAL FUSION     LAPAROSCOPIC APPENDECTOMY N/A 10/04/2021    Procedure: APPENDECTOMY LAPAROSCOPIC;  Surgeon: Romie Levee, MD;  Location: [facility];  Service: General;  Laterality: N/A;   ochiectomy Left    TONSILLECTOMY      Allergies:  Allergies  Allergen Reactions   Morphine And Related Itching    Family History:  Family History  Problem Relation Age of Onset   Heart disease Father     Social History:  Social History   Tobacco Use   Smoking status: Some Days    Types: Cigarettes   Smokeless tobacco: Never  Substance Use Topics   Alcohol use: Yes    Alcohol/week: 2.0 standard drinks of alcohol    Types: 2 Cans of beer per week   Drug use: Not Currently    Review of symptoms:  Constitutional:  Negative for unexplained weight loss, night sweats, fever, chills ENT:  Negative for nose bleeds, sinus pain, painful swallowing CV:  Negative for chest pain, shortness of breath, exercise intolerance, palpitations, loss of consciousness Resp:  Negative for cough, wheezing, shortness of breath GI:  Negative for nausea, vomiting, diarrhea, bloody stools GU:  Positives noted in HPI; otherwise negative for gross hematuria, dysuria, urinary incontinence Neuro:  Negative for seizures, poor balance, limb weakness, slurred speech Psych:  Negative for lack of energy, depression, anxiety Endocrine:  Negative for polydipsia, polyuria, symptoms of hypoglycemia (dizziness, hunger, sweating) Hematologic:  Negative for anemia, purpura, petechia, prolonged or excessive bleeding, use of anticoagulants  Allergic:  Negative for difficulty breathing or choking as a result of exposure to anything; no shellfish allergy; no allergic response (rash/itch) to materials, foods  Physical exam: There were no vitals taken for this visit. GENERAL APPEARANCE:  Well appearing, well developed, well nourished, NAD HEENT: Atraumatic, Normocephalic, oropharynx clear. NECK: Supple without lymphadenopathy or thyromegaly. LUNGS: Clear to auscultation bilaterally. HEART:  Regular Rate and Rhythm without murmurs, gallops, or rubs. ABDOMEN: Soft, non-tender, No Masses. EXTREMITIES: Moves all extremities well.  Without clubbing, cyanosis, or edema. NEUROLOGIC:  Alert and oriented x 3, normal gait, CN II-XII grossly intact.  MENTAL STATUS:  Appropriate. BACK:  Non-tender to palpation.  No CVAT SKIN:  Warm, dry and intact.   GU: Penis:  {Exam; penis:5791} Meatus: {Meatus:15530} Scrotum: {pe scrotum:310183} Testis: {Exam; testicles:5790} Epididymis: {epididymis RUEA:540981} Prostate: {Exam; prostate:5793} Rectum: {rectal exam:26517}   Results: No results found for this or any previous visit (from the past 24 hour(s)).

## 2023-04-24 NOTE — Progress Notes (Signed)
SOAP Notes Session Summary Provider / Clinician's Name: Leander Rams Raeanna Soberanes  Client Name: Joshua Cooper  Date of Service: 04/24/2023 Duration: 60 mins   Subjective: Client reported that he was not doing so well today. He said, "he had not gotten any sleep because he stays in the [facility] area and with everyone finishing classes there was a lot of parties going on late into the night. This has gone on for the majority of the week and I am simply exhausted." Client nodded off as he was talking. He was also very apologetic throughout the session. He talked a little about his ex-wife and said that this was still hitting him randomly and sometimes it was making him angry and other times sad. He did say, "I never think of it in a positive light though." He went on to talk about the urology appointment and he was worried about that because he felt like this one would be more telling of his cancer and so he was extremely nervous.   Objective: Client was not well today. He was so sleepy and kept nodding off during our session. I asked him if he was on any drugs or alcohol and he said no multiple times. He said he was just so tired because of lack of sleep. He does sleep outside and [place] the [place] so it could be that.I got the client some water and we did take a brief break so that he could go outside and get some fresh air which did seem to help him some. He also said that he had not eaten so he was going to [employer] after to get some food. We talked about him getting protein to help with his energy. We also talked about him getting some extra rest when he could. I have seen the client like this before and he gets like this when he begins to slip into a depression so we talked about eating, drinking water, and sleeping to help improve his overall mood. Client is also picking at his skin again and has put a mask over his face as to try and stop him from doing it. He said it was worse  because he shaved.  Assessment: Normally therapy seems to be a positive outlet for the client but specifically today the client was so tired that I do not think that he could even make sense of his thoughts much less process them. I am not sure that he heard much of what I said either. The clients circumstances do not help what is constantly going on. Each day he never knows what to expect and he is just trying to survive. Pair that with past trauma and sometimes it can be to much to bear.   Plan: Continued weekly sessions, with the goal of attending at least 3 per month. The client will focus on getting proper sleep, a shower, water, food, and any other basic daily routines that will help improve his quality of life while he is in this circumstance, with the goal of small changes for improvement. Client will try to give himself some grace knowing that he is trying his best given his circumstances. Client will try to attend the rest of the appointments he has this week, with the goal of attending all of them.

## 2023-04-30 ENCOUNTER — Encounter: Payer: Self-pay | Admitting: Critical Care Medicine

## 2023-04-30 NOTE — Progress Notes (Signed)
SOAP Notes Session Summary Provider / Clinician's Name: Leander Rams Dioselina Brumbaugh  Client Name: Jonathon Totzke  Date of Service: 04/10/2023 Duration: 60 mins.  Subjective: Client stated, "I am not doing so good. The appointments and the thoughts of the outcomes are starting to overwhelm me. There are just so many outcomes and that makes me so nervous." Client is picking at his face and moving around a lot. Client then talked a little about his family in [place]. "My relationship with them is improving so long as we keep the conversation short and we do not talk about my circumstances. I would rather have them in my life instead of not. I do wish I could talk to them about more but for now I will take what I can get. My relationship with my daughter is also improving. She is doing well and her and I chat all the time. I am happy about that." Client began to get tearful. "I am still trying to decide what I am supposed to do about this lawsuit and my ex-wife. I just do not think that I would be able to go through with it and that it would just cause more harm than good in the long run. I just wish that I was not struggling with this as much as I am. I have my ups and downs with the situation."  He also discussed where he is staying at the moment. He did speak on the fact that he feels safer where he is staying so that is a big deal since he sleeps outside. "I am just continuing to take one day at a time and try to be kind to myself when I feel overwhelmed."  Objective: Client was very nervous and anxious. He picked at his face the majority of the time. His hands were constantly moving and he had pressured speech. He was restless and stressed. He does have some serious appointments coming up and I think that it has just caught up to him. Sometimes it is hard to tell what is circumstantial and what is actually happening. I do think he feels more comfortable at the church but he is still sleeping outside. I  am also very happy that he is talking to his family but at the same time it is very surface level. He can not truly tell them what is going on and so he feels alone a lot. This is time where he needs support more than ever. He is trying to cope and be kind to himself but he was caught up in his feelings and emotions big time today.   Assessment: Even though he was having a hard day, I still know that our therapy sessions help him so much. He always has a little calmer demeanor by the time our session is over. He is very open and very honest and he digs deep. He needs that support now. He needs a safe environment where he can get out everything. We talked about how he needs to take one day at a time. We discussed some deep breathing techniques and practicing some moments of mindfulness when it feels like to much.   Plan: Continued weekly therapy sessions with a goal of attending at least 3 in a month. Practice tips for breathing and mindfulness to clear head and calm down anxiety by 2. Continue to write any and everything down when feeling overwhelmed. This is a safe place to get feelings out and remember what we need to discuss  as well at next session.

## 2023-05-01 ENCOUNTER — Other Ambulatory Visit: Payer: Medicaid Other | Admitting: Psychology

## 2023-05-01 ENCOUNTER — Ambulatory Visit (HOSPITAL_COMMUNITY)
Admission: RE | Admit: 2023-05-01 | Discharge: 2023-05-01 | Disposition: A | Discharge status: Home / Self Care | Payer: Medicaid Other | Source: Ambulatory Visit | Attending: Hematology and Oncology | Admitting: Hematology and Oncology

## 2023-05-01 DIAGNOSIS — Z720 Tobacco use: Secondary | ICD-10-CM | POA: Diagnosis present

## 2023-05-01 DIAGNOSIS — Z8547 Personal history of malignant neoplasm of testis: Secondary | ICD-10-CM | POA: Insufficient documentation

## 2023-05-01 DIAGNOSIS — R634 Abnormal weight loss: Secondary | ICD-10-CM | POA: Diagnosis present

## 2023-05-02 ENCOUNTER — Ambulatory Visit
Admission: RE | Admit: 2023-05-02 | Discharge: 2023-05-02 | Disposition: A | Discharge status: Home / Self Care | Payer: Medicaid Other | Source: Ambulatory Visit | Attending: Critical Care Medicine | Admitting: Critical Care Medicine

## 2023-05-02 DIAGNOSIS — Z8547 Personal history of malignant neoplasm of testis: Secondary | ICD-10-CM

## 2023-05-02 DIAGNOSIS — R772 Abnormality of alphafetoprotein: Secondary | ICD-10-CM

## 2023-05-02 MED ORDER — GADOPICLENOL 0.5 MMOL/ML IV SOLN
8.0000 mL | Freq: Once | INTRAVENOUS | Status: AC | PRN
  Administered 2023-05-02: 8 mL via INTRAVENOUS

## 2023-05-04 ENCOUNTER — Encounter (INDEPENDENT_AMBULATORY_CARE_PROVIDER_SITE_OTHER): Payer: Self-pay

## 2023-05-05 ENCOUNTER — Other Ambulatory Visit: Payer: Medicaid Other | Admitting: Psychology

## 2023-05-06 ENCOUNTER — Ambulatory Visit: Payer: Medicaid Other | Admitting: Psychology

## 2023-05-06 ENCOUNTER — Telehealth: Payer: Self-pay

## 2023-05-06 DIAGNOSIS — F3181 Bipolar II disorder: Secondary | ICD-10-CM | POA: Diagnosis not present

## 2023-05-06 DIAGNOSIS — F4310 Post-traumatic stress disorder, unspecified: Secondary | ICD-10-CM

## 2023-05-06 DIAGNOSIS — F411 Generalized anxiety disorder: Secondary | ICD-10-CM | POA: Diagnosis not present

## 2023-05-06 NOTE — Progress Notes (Signed)
Please call patient notify him his MRI was normal no evidence of recurrence of his testicular cancer and I am sure that oncology will be back in touch with him

## 2023-05-06 NOTE — Telephone Encounter (Signed)
-----   Message from Storm Frisk, MD sent at 05/06/2023  8:47 AM EDT ----- Please call patient notify him his MRI was normal no evidence of recurrence of his testicular cancer and I am sure that oncology will be back in touch with him

## 2023-05-06 NOTE — Telephone Encounter (Signed)
Pt was called and is aware of results, DOB was confirmed.  ?

## 2023-05-07 ENCOUNTER — Ambulatory Visit: Payer: Medicaid Other | Admitting: Neurology

## 2023-05-08 ENCOUNTER — Telehealth: Payer: Self-pay | Admitting: Hematology and Oncology

## 2023-05-08 ENCOUNTER — Inpatient Hospital Stay: Payer: Medicaid Other | Admitting: Hematology and Oncology

## 2023-05-08 ENCOUNTER — Encounter: Payer: Medicaid Other | Admitting: Urology

## 2023-05-12 ENCOUNTER — Encounter: Payer: Self-pay | Admitting: Critical Care Medicine

## 2023-05-13 ENCOUNTER — Encounter: Payer: Self-pay | Admitting: Critical Care Medicine

## 2023-05-15 ENCOUNTER — Ambulatory Visit (INDEPENDENT_AMBULATORY_CARE_PROVIDER_SITE_OTHER): Payer: Medicaid Other | Admitting: Psychology

## 2023-05-15 DIAGNOSIS — F3181 Bipolar II disorder: Secondary | ICD-10-CM | POA: Diagnosis not present

## 2023-05-15 DIAGNOSIS — F411 Generalized anxiety disorder: Secondary | ICD-10-CM | POA: Diagnosis not present

## 2023-05-15 DIAGNOSIS — F4310 Post-traumatic stress disorder, unspecified: Secondary | ICD-10-CM

## 2023-05-19 ENCOUNTER — Other Ambulatory Visit: Payer: Self-pay

## 2023-05-20 ENCOUNTER — Inpatient Hospital Stay: Payer: Medicaid Other | Attending: Hematology and Oncology | Admitting: Hematology and Oncology

## 2023-05-20 ENCOUNTER — Telehealth: Payer: Self-pay

## 2023-05-20 NOTE — Telephone Encounter (Signed)
Called and left a message asking him to call the office regarding missed appt today.

## 2023-05-21 ENCOUNTER — Ambulatory Visit: Payer: Medicaid Other | Attending: Cardiology | Admitting: Cardiology

## 2023-05-21 VITALS — BP 119/84 | HR 78 | Ht 71.0 in | Wt 162.8 lb

## 2023-05-21 DIAGNOSIS — Z8249 Family history of ischemic heart disease and other diseases of the circulatory system: Secondary | ICD-10-CM

## 2023-05-21 DIAGNOSIS — Z720 Tobacco use: Secondary | ICD-10-CM

## 2023-05-21 DIAGNOSIS — R55 Syncope and collapse: Secondary | ICD-10-CM | POA: Diagnosis not present

## 2023-05-21 DIAGNOSIS — Z5901 Sheltered homelessness: Secondary | ICD-10-CM

## 2023-05-21 NOTE — Progress Notes (Signed)
Primary Care Provider: Storm Frisk, MD [facility] Cardiologist: None Electrophysiologist: None  Clinic Note: Chief Complaint  Patient presents with   New Patient (Initial Visit)    Evaluate recurrent syncope and tachycardia    ===================================  ASSESSMENT/PLAN   Problem List Items Addressed This Visit     Tobacco use (Chronic)    Counseled against use.      Syncope - Primary    I agree that his syncopal episodes are probably related to dehydration and malnutrition.  This would correlate with him noticing tachycardia, however exclude arrhythmias.  Also does not fit heart disease.  Plan: Exclude arrhythmias and structural heart disease 7-day Zio patch monitor, 2D echo Encourage adequate hydration, avoid sodas especially caffeinated sodas and alcohol.  At least 80 ounces of water a day.  May need to use Gatorade or other rehydration drinks. Try to figure out a way to encourage adequate nutrition.  He is trying to get Medicaid assistance with [employer]       Relevant Orders   EKG 12-Lead (Completed)   ECHOCARDIOGRAM COMPLETE   CARDIAC EVENT MONITOR   Sheltered homelessness tent encampment (Chronic)    Unfortunately, he is homeless and unemployed with no income other than his giving plasma which is not helping his syncopal episodes.  Will need to be very careful and judicious with the evaluation and treatment options.       Family history of valvular heart disease   Relevant Orders   EKG 12-Lead (Completed)   ECHOCARDIOGRAM COMPLETE   CARDIAC EVENT MONITOR   ===================================  HPI:    Deacon Nicolosi is a 47 y.o. male with history of Testicular Cancer and COPD/Chronic Bronchitis who is being seen today for the evaluation of RECURRENT SYNCOPE, TACHYCARDIA at the request of Storm Frisk, MD.  Kendry Ferrand was seen by [name] in January 2024 with chief complaint of loss of consciousness-2 episodes of  fainting in the last 4 months.  He noted that he gets very dizzy and lightheaded when he stands up.  Indicates that he gives plasma twice a week.  He describes having a couple episodes.  Is also been questionable history of seizures dating back to 2022.  He described an episode where he was walking up to [place] and while walking up the steps he must of collapse because he is being held up by several people.  He felt weak all over.  When he stood up he felt lightheaded dizzy and was shaking.  He is not even able to hold up soda bottle.  No incontinence.  There is a couple of episodes last April when he was talking to a friend outside smoking a cigarette and he fell onto the friend.  No mention of any convulsion.  Has second episode when he was walking down the [place] and just fell in the grass.  He had been on BP meds up until the episode last April.  He drinks plenty of water. .=> Due to concern for either syncope versus seizures he was referred to both neurology and cardiology.  The thought was that he was syncopized due to giving plasma and not keeping up with hydration and nutrition.  Recent Hospitalizations: No recent hospitalizations.  Reviewed  CV studies:    The following studies were reviewed today: (if available, images/films reviewed: From Epic Chart or Care Everywhere) None:  Interval History:   Gaelen Rayas is a very interesting gentleman who presents here today for evaluation several episodes  of passing out.  The first time he noticed it was in December on the 22nd he was walking up to his room and walk approximately put him on a couch.  80 felt like he may have had some increased heart rate but really felt more dizzy and woozy.  He says drinks plain water trying to at least but also drinks soda.  Rarely drinks beer. He does not always get enough nutrition and is no longer on BP meds.  Some mild end of day swelling but he walks around all day patient denies any chest pain or  pressure with rest or exertion.  No.  No PND or orthopnea.  He does typically feel his heart rate going fast when he feels lightheaded and flushed before passing out.  REVIEWED OF SYSTEMS   Review of Systems  Constitutional:  Positive for weight loss (15 pounds lighter than last October). Negative for malaise/fatigue.  HENT:  Negative for congestion.   Respiratory:  Positive for cough and shortness of breath (Only if he overdoes it).   Gastrointestinal:  Negative for abdominal pain, blood in stool, diarrhea and melena.  Genitourinary:  Negative for dysuria, frequency and hematuria.  Musculoskeletal:  Positive for falls (Only with syncopal episodes.). Negative for joint pain.  Neurological:  Positive for dizziness, tingling (Feels tingling and lightheaded when he has syncope episodes.), seizures (Per report-due to see neurology) and loss of consciousness (Per HPI).  Psychiatric/Behavioral:  Positive for depression. Negative for hallucinations, memory loss and suicidal ideas. The patient is nervous/anxious and has insomnia.     I have reviewed and (if needed) personally updated the patient's problem list, medications, allergies, past medical and surgical history, social and family history.   PAST MEDICAL HISTORY   Past Medical History:  Diagnosis Date   Anxiety    Asthma    Bronchitis    Cancer (HCC)    Depression    Perforated appendicitis 10/05/2021   Recurrent syncope    Small bowel obstruction (HCC) 10/23/2021   Testicular cancer (HCC)     PAST SURGICAL HISTORY   Past Surgical History:  Procedure Laterality Date   ABDOMINAL SURGERY     APPENDECTOMY     CERVICAL FUSION     LAPAROSCOPIC APPENDECTOMY N/A 10/04/2021   Procedure: APPENDECTOMY LAPAROSCOPIC;  Surgeon: Romie Levee, MD;  Location: [facility];  Service: General;  Laterality: N/A;   ochiectomy Left    TONSILLECTOMY      MEDICATIONS/ALLERGIES   Current Meds  Medication Sig   albuterol (VENTOLIN HFA) 108 (90  Base) MCG/ACT inhaler Inhale 2 puffs into the lungs every 4 (four) hours as needed for wheezing or shortness of breath.   gabapentin (NEURONTIN) 600 MG tablet Take 1 tablet (600 mg total) by mouth 3 (three) times daily.   mirtazapine (REMERON) 7.5 MG tablet Take 1 tablet (7.5 mg total) by mouth at bedtime.   venlafaxine XR (EFFEXOR-XR) 75 MG 24 hr capsule Take 3 capsules (225 mg total) by mouth daily with breakfast.    Allergies  Allergen Reactions   Morphine And Codeine Itching    SOCIAL HISTORY/FAMILY HISTORY   Reviewed in Epic:   Social History   Tobacco Use   Smoking status: Some Days    Types: Cigarettes   Smokeless tobacco: Never  Substance Use Topics   Alcohol use: Yes    Alcohol/week: 2.0 standard drinks of alcohol    Types: 2 Cans of beer per week   Drug use: Not Currently   Social  History   Social History Narrative   He is currently unemployed, and homeless.  He lives in a shelter ([facility]      He is a native of [place] but has been in the [place] for quite a long time now.   He donates plasma 2 times a week get some money.  Also tries to jobs here and there.   Family History  Problem Relation Age of Onset   Heart disease Father        Had valve surgery of some type.    OBJCTIVE -PE, EKG, labs   Wt Readings from Last 3 Encounters:  05/21/23 162 lb 12.8 oz (73.8 kg)  04/17/23 163 lb 3.2 oz (74 kg)  01/22/23 174 lb (78.9 kg)    Physical Exam: BP 119/84   Pulse 78   Ht 5\' 11"  (1.803 m)   Wt 162 lb 12.8 oz (73.8 kg)   SpO2 96%   BMI 22.71 kg/m  Physical Exam Constitutional:      General: He is not in acute distress.    Appearance: He is ill-appearing (Mild chronic ill appearance.  Probably more because of lack of nutrition). He is not toxic-appearing.     Comments: Remarkably well-groomed.  Somewhat thin and frail.  HENT:     Head: Normocephalic and atraumatic.  Cardiovascular:     Rate and Rhythm: Normal rate and regular rhythm.     Pulses: Normal  pulses.     Heart sounds: Normal heart sounds. No murmur heard.    No friction rub. No gallop.  Pulmonary:     Effort: Pulmonary effort is normal. No respiratory distress.     Breath sounds: Rhonchi present. No wheezing.  Chest:     Chest wall: Tenderness present.  Abdominal:     General: Abdomen is flat. Bowel sounds are normal. There is no distension.     Palpations: Abdomen is soft.     Tenderness: There is no abdominal tenderness. There is no guarding or rebound.  Musculoskeletal:        General: No swelling. Normal range of motion.  Skin:    General: Skin is warm and dry.  Neurological:     General: No focal deficit present.     Mental Status: He is alert and oriented to person, place, and time.     Gait: Gait normal.  Psychiatric:        Mood and Affect: Mood normal.     Comments: Somewhat rapid and tangential speech.  Seems to be somewhat hyper.      Adult ECG Report  Rate: 78;  Rhythm: normal sinus rhythm and normal axis, intervals durations. ;   Narrative Interpretation: Stable  Recent Labs: Reviewed. Lab Results  Component Value Date   CHOL 165 09/12/2022   HDL 51 09/12/2022   LDLCALC 94 09/12/2022   TRIG 109 09/12/2022   CHOLHDL 3.2 09/12/2022   Lab Results  Component Value Date   CREATININE 0.79 04/17/2023   BUN 6 04/17/2023   NA 141 04/17/2023   K 3.7 04/17/2023   CL 107 04/17/2023   CO2 30 04/17/2023      Latest Ref Rng & Units 04/17/2023    1:35 PM 01/22/2023   11:25 AM 09/12/2022   10:23 AM  CBC  WBC 4.0 - 10.5 K/uL 9.4  8.6  9.0   Hemoglobin 13.0 - 17.0 g/dL 33.2  95.1  88.4   Hematocrit 39.0 - 52.0 % 47.6  47.4  50.0  Platelets 150 - 400 K/uL 219  262  258     Lab Results  Component Value Date   HGBA1C 5.2 09/12/2022   Lab Results  Component Value Date   TSH 1.424 04/17/2023    ================================================== I spent a total of 26 minutes with the patient spent in direct patient consultation.  Additional time  spent with chart review  / charting (studies, outside notes, etc): 25 min Total Time: 51 min  Current medicines are reviewed at length with the patient today.  (+/- concerns) N/A  Notice: This dictation was prepared with Dragon dictation along with smart phrase technology. Any transcriptional errors that result from this process are unintentional and may not be corrected upon review.   Studies Ordered:  Orders Placed This Encounter  Procedures   CARDIAC EVENT MONITOR   EKG 12-Lead   ECHOCARDIOGRAM COMPLETE   No orders of the defined types were placed in this encounter.   Patient Instructions / Medication Changes & Studies & Tests Ordered   Patient Instructions  Medication Instructions:  No changes   *If you need a refill on your cardiac medications before your next appointment, please call your pharmacy*   Lab Work: Not needed    Testing/Procedures: Will be mailed to your home in 3 to 7 days Your physician has recommended that you wear an event monitor 30 days. Event monitors are medical devices that record the heart's electrical activity. Doctors most often [place] these monitors to diagnose arrhythmias. Arrhythmias are problems with the speed or rhythm of the heartbeat. The monitor is a small, portable device. You can wear one while you do your normal daily activities. This is usually used to diagnose what is causing palpitations/syncope (passing out).   And   Will be schedule at [facility] [address] Your physician has requested that you have an echocardiogram. Echocardiography is a painless test that uses sound waves to create images of your heart. It provides your doctor with information about the size and shape of your heart and how well your heart's chambers and valves are working. This procedure takes approximately one hour. There are no restrictions for this procedure. Please do NOT wear cologne, perfume, aftershave, or lotions (deodorant is allowed). Please  arrive 15 minutes prior to your appointment time.   Follow-Up: At [facility], you and your health needs are our priority.  As part of our continuing mission to provide you with exceptional heart care, we have created designated Provider Care Teams.  These Care Teams include your primary Cardiologist (physician) and Advanced Practice Providers (APPs -  Physician Assistants and Nurse Practitioners) who all work together to provide you with the care you need, when you need it.     Your next appointment:   2 month(s)  The format for your next appointment:   In Person  Provider:   Dr Herbie Baltimore  or Bernadene Person NP, Carlos Levering NP, Joni Reining NP    Other Instructions  [employer] Cardiac Event Monitor Instructions    Marykay Lex, MD, MS Bryan Lemma, M.D., M.S. Interventional Cardiologist  [facility]  Pager # [phone] Phone # [phone] [address] [place], [place] [address]   Thank you for choosing [facility] at [place]!!

## 2023-05-21 NOTE — Patient Instructions (Signed)
Medication Instructions:  No changes   *If you need a refill on your cardiac medications before your next appointment, please call your pharmacy*   Lab Work: Not needed    Testing/Procedures: Will be mailed to your home in 3 to 7 days Your physician has recommended that you wear an event monitor 30 days. Event monitors are medical devices that record the heart's electrical activity. Doctors most often [place] these monitors to diagnose arrhythmias. Arrhythmias are problems with the speed or rhythm of the heartbeat. The monitor is a small, portable device. You can wear one while you do your normal daily activities. This is usually used to diagnose what is causing palpitations/syncope (passing out).   And   Will be schedule at [facility] [address] Your physician has requested that you have an echocardiogram. Echocardiography is a painless test that uses sound waves to create images of your heart. It provides your doctor with information about the size and shape of your heart and how well your heart's chambers and valves are working. This procedure takes approximately one hour. There are no restrictions for this procedure. Please do NOT wear cologne, perfume, aftershave, or lotions (deodorant is allowed). Please arrive 15 minutes prior to your appointment time.   Follow-Up: At [facility], you and your health needs are our priority.  As part of our continuing mission to provide you with exceptional heart care, we have created designated Provider Care Teams.  These Care Teams include your primary Cardiologist (physician) and Advanced Practice Providers (APPs -  Physician Assistants and Nurse Practitioners) who all work together to provide you with the care you need, when you need it.     Your next appointment:   2 month(s)  The format for your next appointment:   In Person  Provider:   Dr Herbie Baltimore  or Bernadene Person NP, Carlos Levering NP, Joni Reining NP    Other  Instructions  [employer] Cardiac Event Monitor Instructions  Your physician has requested you wear your cardiac event monitor for _____ days, (1-30). [employer] may call or text to confirm a shipping address. The monitor will be sent to a land address via UPS. [employer] will not ship a monitor to a PO BOX. It typically takes 3-5 days to receive your monitor after it has been enrolled. [employer] will assist with USPS tracking if your package is delayed. The telephone number for [employer] is [phone]. Once you have received your monitor, please review the enclosed instructions. Instruction tutorials can also be viewed under help and settings on the enclosed cell phone. Your monitor has already been registered assigning a specific monitor serial # to you.  Billing and Self Pay Discount Information  [employer] has been provided the insurance information we had on file for you.  If your insurance has been updated, please call [employer] at [phone] to provide them with your updated insurance information.   [employer] offers a discounted Self Pay option for patients who have insurance that does not cover their cardiac event monitor or patients without insurance.  The discounted cost of a Self Pay Cardiac Event Monitor would be $225.00 , if the patient contacts [employer] at [phone] within 7 days of applying the monitor to make payment arrangements.  If the patient does not contact [employer] within 7 days of applying the monitor, the cost of the cardiac event monitor will be $350.00.  Applying the monitor  Remove cell phone from case and turn it on. The cell phone works as  your transmitter and needs to be within 10 feet of you at all times. The cell phone will need to be charged on a daily basis. We recommend you plug the cell phone into the enclosed charger at your bedside table every night.  Monitor batteries: You will receive two monitor batteries labelled #1 and #2. These are  your recorders. Plug battery #2 onto the second connection on the enclosed charger. Keep one battery on the charger at all times. This will keep the monitor battery deactivated. It will also keep it fully charged for when you need to switch your monitor batteries. A small light will be blinking on the battery emblem when it is charging. The light on the battery emblem will remain on when the battery is fully charged.  Open package of a Monitor strip. Insert battery #1 into black hood on strip and gently squeeze monitor battery onto connection as indicated in instruction booklet. Set aside while preparing skin.  Choose location for your strip, vertical or horizontal, as indicated in the instruction booklet. Shave to remove all hair from location. There cannot be any lotions, oils, powders, or colognes on skin where monitor is to be applied. Wipe skin clean with enclosed Saline wipe. Dry skin completely.  Peel paper labeled #1 off the back of the Monitor strip exposing the adhesive. Place the monitor on the chest in the vertical or horizontal position shown in the instruction booklet. One arrow on the monitor strip must be pointing upward. Carefully remove paper labeled #2, attaching remainder of strip to your skin. Try not to create any folds or wrinkles in the strip as you apply it.  Firmly press and release the circle in the center of the monitor battery. You will hear a small beep. This is turning the monitor battery on. The heart emblem on the monitor battery will light up every 5 seconds if the monitor battery in turned on and connected to the patient securely. Do not push and hold the circle down as this turns the monitor battery off. The cell phone will locate the monitor battery. A screen will appear on the cell phone checking the connection of your monitor strip. This may read poor connection initially but change to good connection within the next minute. Once your monitor accepts the  connection you will hear a series of 3 beeps followed by a climbing crescendo of beeps. A screen will appear on the cell phone showing the two monitor strip placement options. Touch the picture that demonstrates where you applied the monitor strip.  Your monitor strip and battery are waterproof. You are able to shower, bathe, or swim with the monitor on. They just ask you do not submerge deeper than 3 feet underwater. We recommend removing the monitor if you are swimming in a lake, river, or ocean.  Your monitor battery will need to be switched to a fully charged monitor battery approximately once a week. The cell phone will alert you of an action which needs to be made.  On the cell phone, tap for details to reveal connection status, monitor battery status, and cell phone battery status. The green dots indicates your monitor is in good status. A red dot indicates there is something that needs your attention.  To record a symptom, click the circle on the monitor battery. In 30-60 seconds a list of symptoms will appear on the cell phone. Select your symptom and tap save. Your monitor will record a sustained or significant arrhythmia regardless of  you clicking the button. Some patients do not feel the heart rhythm irregularities. [employer] will notify us of any serious or critical events.  Refer to instruction booklet for instructions on switching batteries, changing strips, the Do not disturb or Pause features, or any additional questions.  Call [employer] at [phone], to confirm your monitor is transmitting and record your baseline. They will answer any questions you may have regarding the monitor instructions at that time.  Returning the monitor to [employer]  Place all equipment back into blue box. Peel off strip of paper to expose adhesive and close box securely. There is a prepaid UPS shipping label on this box. Drop in a UPS drop box, or at a UPS facility like [employer]. You  may also contact [employer] to arrange UPS to pick up monitor package at your home.

## 2023-05-22 ENCOUNTER — Other Ambulatory Visit: Payer: Medicaid Other | Admitting: Psychology

## 2023-05-23 ENCOUNTER — Ambulatory Visit: Payer: Medicaid Other

## 2023-05-27 ENCOUNTER — Ambulatory Visit: Payer: Medicaid Other | Attending: Cardiology

## 2023-05-27 ENCOUNTER — Encounter: Payer: Self-pay | Admitting: Cardiology

## 2023-05-27 NOTE — Assessment & Plan Note (Signed)
I agree that his syncopal episodes are probably related to dehydration and malnutrition.  This would correlate with him noticing tachycardia, however exclude arrhythmias.  Also does not fit heart disease.  Plan: Exclude arrhythmias and structural heart disease 7-day Zio patch monitor, 2D echo Encourage adequate hydration, avoid sodas especially caffeinated sodas and alcohol.  At least 80 ounces of water a day.  May need to use Gatorade or other rehydration drinks. Try to figure out a way to encourage adequate nutrition.  He is trying to get Medicaid assistance with [employer]

## 2023-05-27 NOTE — Assessment & Plan Note (Signed)
Unfortunately, he is homeless and unemployed with no income other than his giving plasma which is not helping his syncopal episodes.  Will need to be very careful and judicious with the evaluation and treatment options.

## 2023-05-27 NOTE — Assessment & Plan Note (Signed)
Counseled against use.

## 2023-05-29 ENCOUNTER — Other Ambulatory Visit: Payer: Medicaid Other | Admitting: Psychology

## 2023-06-02 NOTE — Progress Notes (Unsigned)
SOAP Notes Session Summary Provider / Clinician's Name: Leander Rams Lexx Monte  Client Name: Joshua Cooper   Date of Service: 05/15/2023 Duration: 60 mins.   Subjective: Client reported that things had been very tough. "Living outside has really been getting to me. I know I can not stay at the church much longer. Then I do not know where I would go. This worries me constantly. Then I          Objective: Practitioner reported findings           Assessment: Client's response to sessions or treatment          Plan: Recommendations for future care

## 2023-06-05 ENCOUNTER — Other Ambulatory Visit: Payer: Medicaid Other | Admitting: Psychology

## 2023-06-06 ENCOUNTER — Other Ambulatory Visit: Payer: Self-pay

## 2023-06-06 ENCOUNTER — Ambulatory Visit (INDEPENDENT_AMBULATORY_CARE_PROVIDER_SITE_OTHER): Payer: Medicaid Other | Admitting: Physician Assistant

## 2023-06-06 ENCOUNTER — Encounter (HOSPITAL_COMMUNITY): Payer: Self-pay | Admitting: Physician Assistant

## 2023-06-06 VITALS — BP 134/94 | HR 75 | Ht 71.0 in | Wt 162.0 lb

## 2023-06-06 DIAGNOSIS — F332 Major depressive disorder, recurrent severe without psychotic features: Secondary | ICD-10-CM | POA: Diagnosis not present

## 2023-06-06 DIAGNOSIS — Z720 Tobacco use: Secondary | ICD-10-CM | POA: Diagnosis not present

## 2023-06-06 DIAGNOSIS — F411 Generalized anxiety disorder: Secondary | ICD-10-CM

## 2023-06-06 MED ORDER — QUETIAPINE FUMARATE 50 MG PO TABS
50.0000 mg | ORAL_TABLET | Freq: Every day | ORAL | 1 refills | Status: AC
Start: 2023-06-06 — End: ?
  Filled 2023-06-06: qty 30, 30d supply, fill #0
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
## 2023-06-09 ENCOUNTER — Encounter: Payer: Self-pay | Admitting: Critical Care Medicine

## 2023-06-10 ENCOUNTER — Other Ambulatory Visit: Payer: Self-pay | Admitting: Critical Care Medicine

## 2023-06-10 ENCOUNTER — Other Ambulatory Visit: Payer: Self-pay

## 2023-06-10 MED ORDER — ALBUTEROL SULFATE HFA 108 (90 BASE) MCG/ACT IN AERS
2.0000 | INHALATION_SPRAY | RESPIRATORY_TRACT | 0 refills | Status: AC | PRN
  Filled 2023-06-10: qty 18, 17d supply, fill #0

## 2023-06-12 ENCOUNTER — Ambulatory Visit (INDEPENDENT_AMBULATORY_CARE_PROVIDER_SITE_OTHER): Payer: Medicaid Other | Admitting: Psychology

## 2023-06-12 DIAGNOSIS — F4310 Post-traumatic stress disorder, unspecified: Secondary | ICD-10-CM

## 2023-06-12 DIAGNOSIS — F411 Generalized anxiety disorder: Secondary | ICD-10-CM | POA: Diagnosis not present

## 2023-06-12 DIAGNOSIS — F3181 Bipolar II disorder: Secondary | ICD-10-CM | POA: Diagnosis not present

## 2023-06-17 ENCOUNTER — Other Ambulatory Visit: Payer: Self-pay

## 2023-06-19 ENCOUNTER — Other Ambulatory Visit: Payer: Medicaid Other | Admitting: Psychology

## 2023-06-19 ENCOUNTER — Ambulatory Visit (HOSPITAL_COMMUNITY): Payer: Medicaid Other

## 2023-06-20 ENCOUNTER — Other Ambulatory Visit: Payer: Medicaid Other | Admitting: Psychology
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
## 2023-06-26 ENCOUNTER — Other Ambulatory Visit: Payer: Medicaid Other | Admitting: Psychology

## 2023-06-30 ENCOUNTER — Other Ambulatory Visit: Payer: Medicaid Other | Admitting: Psychology

## 2023-07-01 ENCOUNTER — Encounter (HOSPITAL_COMMUNITY): Payer: Self-pay | Admitting: Cardiology

## 2023-07-01 ENCOUNTER — Other Ambulatory Visit: Payer: Medicaid Other | Admitting: Psychology

## 2023-07-08 ENCOUNTER — Ambulatory Visit (INDEPENDENT_AMBULATORY_CARE_PROVIDER_SITE_OTHER): Payer: Medicaid Other | Admitting: Psychology

## 2023-07-08 ENCOUNTER — Other Ambulatory Visit: Payer: Medicaid Other | Admitting: Psychology

## 2023-07-08 DIAGNOSIS — F411 Generalized anxiety disorder: Secondary | ICD-10-CM | POA: Diagnosis not present

## 2023-07-08 DIAGNOSIS — F3181 Bipolar II disorder: Secondary | ICD-10-CM | POA: Diagnosis not present

## 2023-07-08 DIAGNOSIS — F4310 Post-traumatic stress disorder, unspecified: Secondary | ICD-10-CM | POA: Diagnosis not present

## 2023-07-09 ENCOUNTER — Telehealth (HOSPITAL_COMMUNITY): Payer: Self-pay | Admitting: Cardiology

## 2023-07-09 NOTE — Telephone Encounter (Signed)
Just an FYI. We have made several attempts to contact this patient including sending a letter to schedule or reschedule their echocardiogram. We will be removing the patient from the echo WQ.  SENT LETTER THRU MY CHART  06/30/23 [facility] to reschedule x3 @ 8:22/LBW  06/19/23 [facility] to schedule @ 9:26/LBW  06/19/23 Pt is homeless and lvm @ 4:30am  that he was not able to come today/LBW        Thank you

## 2023-07-17 ENCOUNTER — Other Ambulatory Visit: Payer: Medicaid Other | Admitting: Psychology

## 2023-07-18 ENCOUNTER — Telehealth: Payer: Self-pay | Admitting: Psychology

## 2023-07-18 ENCOUNTER — Encounter (HOSPITAL_COMMUNITY): Payer: Medicaid Other | Admitting: Physician Assistant

## 2023-07-19 NOTE — Progress Notes (Deleted)
   Cardiology Clinic Note   Date: 07/19/2023 ID: Joshua Cooper, DOB 12/23/1976, MRN [id number]  Primary Cardiologist:  None  Patient Profile    Joshua Cooper is a 47 y.o. male who presents to the clinic today for ***    Past medical history significant for: Syncope. COPD. Testicular cancer. Tobacco abuse.     History of Present Illness    Joshua Cooper was first evaluated by Dr. Herbie Baltimore on 05/21/2023 for recurrent syncope and tachycardia at the request of [name].  Patient was seen by [name] in January 2024 with complaints of 2 episodes of fainting in the prior 4 months.  He noted getting very dizzy and lightheaded upon standing.  He reported giving plasma twice a week.  There is also questionable history of seizures dating back to 2022.  Patient described several episodes of feeling lightheaded, dizzy, weak and shaky with episodic falls but no convulsions.  Given the concern for syncope versus seizures he was referred to both neurology and cardiology.  It was felt syncopal episodes could be secondary to giving plasma and not keeping up with hydration/nutrition.  Of note patient is homeless and receives income by donating plasma.  Echo and 7-day Zio were ordered however neither were completed.  Today, patient ***  Syncope.***   ROS: All other systems reviewed and are otherwise negative except as noted in History of Present Illness.  Studies Reviewed       ***  Risk Assessment/Calculations    {Does this patient have ATRIAL FIBRILLATION?:[phone]} No BP recorded.  {Refresh Note OR Click here to enter BP  :1}***        Physical Exam    VS:  There were no vitals taken for this visit. , BMI There is no height or weight on file to calculate BMI.  GEN: Well nourished, well developed, in no acute distress. Neck: No JVD or carotid bruits. Cardiac: *** RRR. No murmurs. No rubs or gallops.   Respiratory:  Respirations regular and unlabored. Clear to auscultation  without rales, wheezing or rhonchi. GI: Soft, nontender, nondistended. Extremities: Radials/DP/PT 2+ and equal bilaterally. No clubbing or cyanosis. No edema ***  Skin: Warm and dry, no rash. Neuro: Strength intact.  Assessment & Plan   ***  Disposition: ***     {Are you ordering a CV Procedure (e.g. stress test, cath, DCCV, TEE, etc)?   Press F2        :[id number]}   Signed, Etta Grandchild. , DNP, NP-C

## 2023-07-21 ENCOUNTER — Ambulatory Visit: Payer: Medicaid Other | Attending: Student | Admitting: Student
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
## 2023-07-25 ENCOUNTER — Ambulatory Visit (INDEPENDENT_AMBULATORY_CARE_PROVIDER_SITE_OTHER): Payer: Medicaid Other | Admitting: Psychology

## 2023-07-25 DIAGNOSIS — F411 Generalized anxiety disorder: Secondary | ICD-10-CM

## 2023-07-25 DIAGNOSIS — F4310 Post-traumatic stress disorder, unspecified: Secondary | ICD-10-CM

## 2023-07-25 DIAGNOSIS — F3181 Bipolar II disorder: Secondary | ICD-10-CM | POA: Diagnosis not present

## 2023-08-01 ENCOUNTER — Ambulatory Visit (INDEPENDENT_AMBULATORY_CARE_PROVIDER_SITE_OTHER): Payer: Medicaid Other | Admitting: Psychology

## 2023-08-01 DIAGNOSIS — F411 Generalized anxiety disorder: Secondary | ICD-10-CM | POA: Diagnosis not present

## 2023-08-01 DIAGNOSIS — F3181 Bipolar II disorder: Secondary | ICD-10-CM

## 2023-08-01 DIAGNOSIS — F4310 Post-traumatic stress disorder, unspecified: Secondary | ICD-10-CM | POA: Diagnosis not present

## 2023-08-08 ENCOUNTER — Ambulatory Visit (INDEPENDENT_AMBULATORY_CARE_PROVIDER_SITE_OTHER): Payer: Medicaid Other | Admitting: Psychology

## 2023-08-08 DIAGNOSIS — F3181 Bipolar II disorder: Secondary | ICD-10-CM | POA: Diagnosis not present

## 2023-08-08 DIAGNOSIS — F4310 Post-traumatic stress disorder, unspecified: Secondary | ICD-10-CM

## 2023-08-08 DIAGNOSIS — F411 Generalized anxiety disorder: Secondary | ICD-10-CM | POA: Diagnosis not present

## 2023-08-12 IMAGING — DX DG ABDOMEN 1V
1 series · 1 of 1 positions shown · non-contrast
Comparison: CT Abdomen and Pelvis 10/04/2021.

CLINICAL DATA: 45-year-old male NG tube placement.

EXAM:
ABDOMEN - 1 VIEW

[abdomen kub]
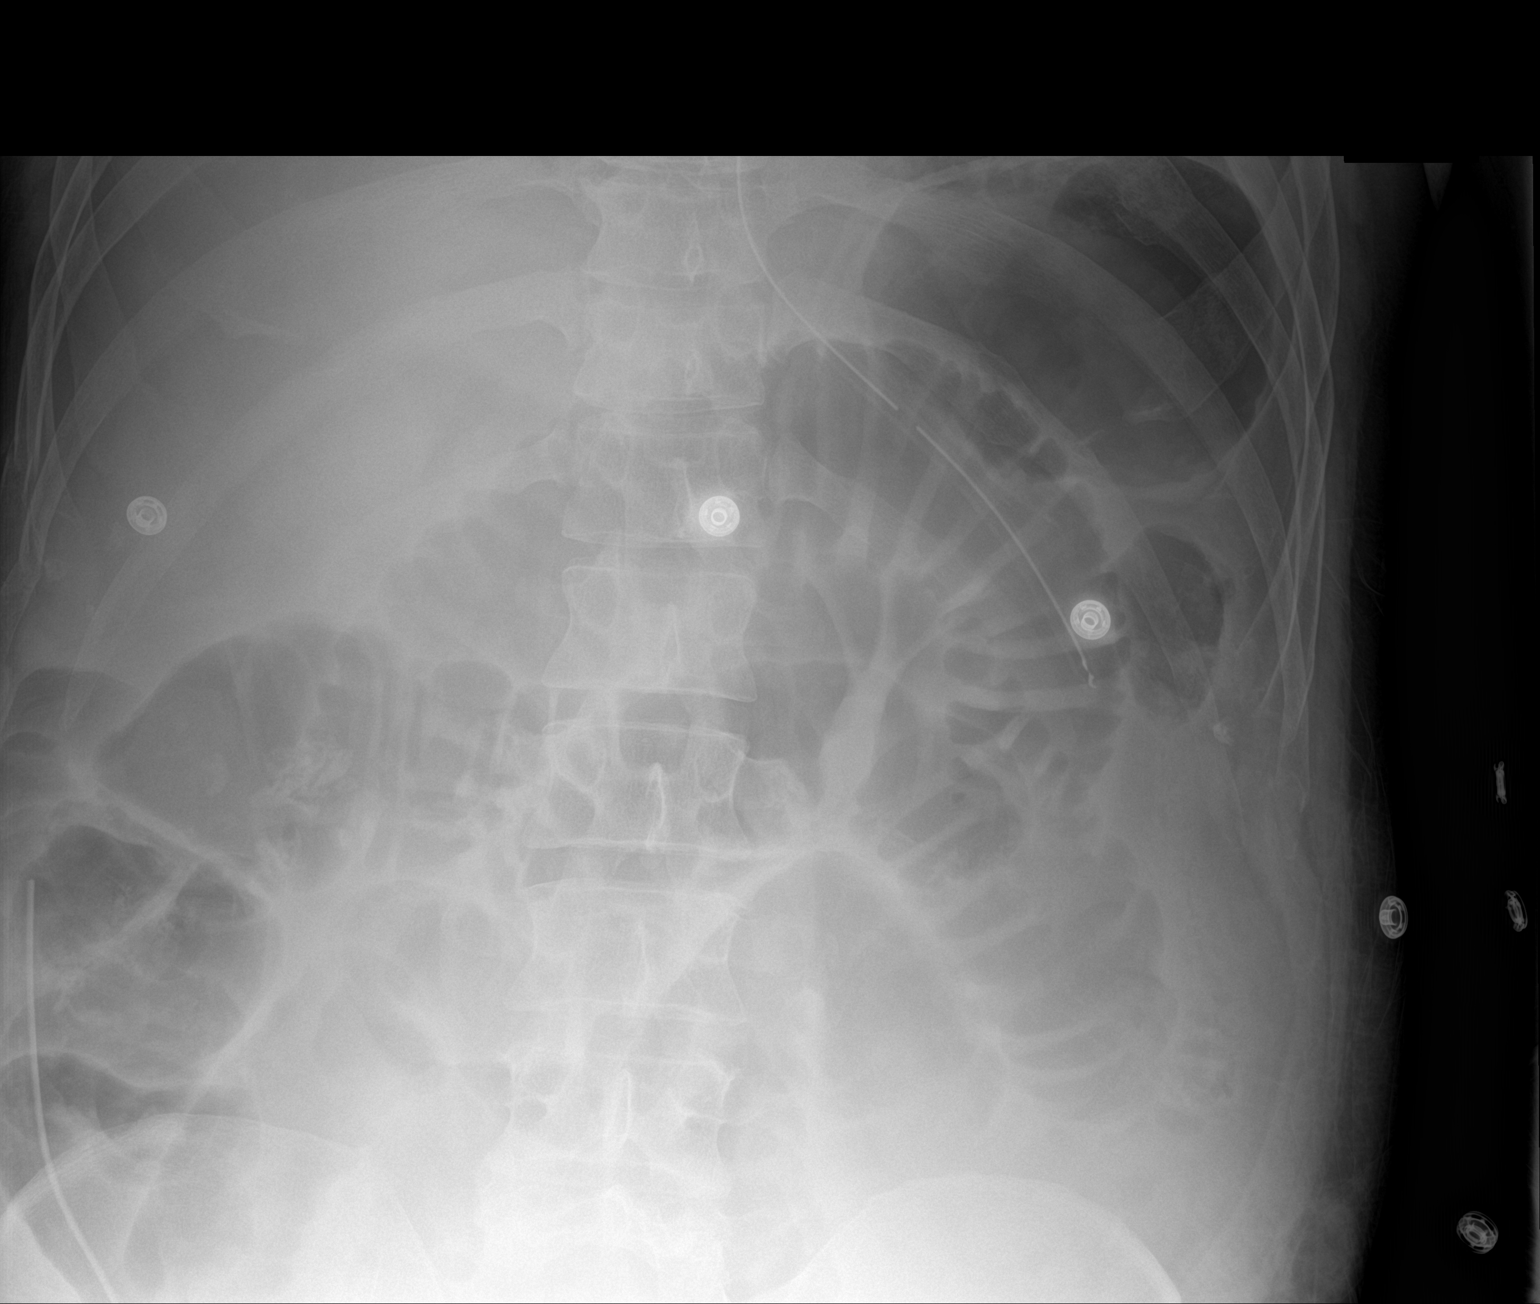

[1 of 1 positions shown; findings below may reference images not displayed]

FINDINGS: Portable AP view at 4748 hours. Enteric tube placed into the
stomach, side hole at the level of the gastric body.

Patchy bilateral lung base opacity, perhaps atelectasis.

Ongoing gas distended small bowel loops in the abdomen, individually
up to 5 cm diameter. Paucity of colonic gas.

No acute osseous abnormality identified.
IMPRESSION: 1. Enteric tube placed into the stomach, side hole at the level of
the gastric body.
2. Small-Bowel Obstruction gas pattern.
3. Patchy lung base opacity, favor atelectasis.

## 2023-08-15 ENCOUNTER — Encounter (HOSPITAL_COMMUNITY): Payer: Self-pay

## 2023-08-15 ENCOUNTER — Other Ambulatory Visit: Payer: Self-pay

## 2023-08-15 ENCOUNTER — Emergency Department (HOSPITAL_COMMUNITY)
Admission: EM | Admit: 2023-08-15 | Discharge: 2023-08-15 | Disposition: A | Discharge status: Home / Self Care | Payer: Medicaid Other | Attending: Emergency Medicine | Admitting: Emergency Medicine

## 2023-08-15 ENCOUNTER — Emergency Department (HOSPITAL_COMMUNITY): Payer: Medicaid Other

## 2023-08-15 ENCOUNTER — Other Ambulatory Visit: Payer: Medicaid Other | Admitting: Psychology

## 2023-08-15 DIAGNOSIS — K029 Dental caries, unspecified: Secondary | ICD-10-CM | POA: Diagnosis not present

## 2023-08-15 DIAGNOSIS — K0889 Other specified disorders of teeth and supporting structures: Secondary | ICD-10-CM

## 2023-08-15 DIAGNOSIS — R0602 Shortness of breath: Secondary | ICD-10-CM | POA: Diagnosis not present

## 2023-08-15 DIAGNOSIS — Z1152 Encounter for screening for COVID-19: Secondary | ICD-10-CM | POA: Diagnosis not present

## 2023-08-15 DIAGNOSIS — Z8547 Personal history of malignant neoplasm of testis: Secondary | ICD-10-CM | POA: Insufficient documentation

## 2023-08-15 DIAGNOSIS — J45909 Unspecified asthma, uncomplicated: Secondary | ICD-10-CM | POA: Diagnosis not present

## 2023-08-15 LAB — BASIC METABOLIC PANEL
Anion gap: 6 (ref 5–15)
BUN: 14 mg/dL (ref 6–20)
CO2: 26 mmol/L (ref 22–32)
Calcium: 8.2 mg/dL — ABNORMAL LOW (ref 8.9–10.3)
Chloride: 107 mmol/L (ref 98–111)
Creatinine, Ser: 0.7 mg/dL (ref 0.61–1.24)
GFR, Estimated: >60 mL/min (ref >60)
Glucose, Bld: 94 mg/dL (ref 70–99)
Potassium: 3.7 mmol/L (ref 3.5–5.1)
Sodium: 139 mmol/L (ref 135–145)

## 2023-08-15 LAB — CBC WITH DIFFERENTIAL/PLATELET
Abs Immature Granulocytes: 0.02 10*3/uL (ref 0.00–0.07)
Basophils Absolute: 0.1 10*3/uL (ref 0.0–0.1)
Basophils Relative: 1 %
Eosinophils Absolute: 0.5 10*3/uL (ref 0.0–0.5)
Eosinophils Relative: 6 %
HCT: 40.3 % (ref 39.0–52.0)
Hemoglobin: 13.3 g/dL (ref 13.0–17.0)
Immature Granulocytes: 0 %
Lymphocytes Relative: 42 %
Lymphs Abs: 3.4 10*3/uL (ref 0.7–4.0)
MCH: 27.7 pg (ref 26.0–34.0)
MCHC: 33 g/dL (ref 30.0–36.0)
MCV: 83.8 fL (ref 80.0–100.0)
Monocytes Absolute: 0.6 10*3/uL (ref 0.1–1.0)
Monocytes Relative: 7 %
Neutro Abs: 3.6 10*3/uL (ref 1.7–7.7)
Neutrophils Relative %: 44 %
Platelets: 228 10*3/uL (ref 150–400)
RBC: 4.81 MIL/uL (ref 4.22–5.81)
RDW: 14.3 % (ref 11.5–15.5)
WBC: 8.2 10*3/uL (ref 4.0–10.5)
nRBC: 0 % (ref 0.0–0.2)

## 2023-08-15 LAB — CBG MONITORING, ED: Glucose-Capillary: 121 mg/dL — ABNORMAL HIGH (ref 70–99)

## 2023-08-15 LAB — SARS CORONAVIRUS 2 BY RT PCR: SARS Coronavirus 2 by RT PCR: NEGATIVE

## 2023-08-15 LAB — TROPONIN I (HIGH SENSITIVITY): Troponin I (High Sensitivity): <2 ng/L (ref <18)

## 2023-08-15 IMAGING — CT CT ABD-PELV W/ CM
2 of 5 series · 16 of 46 positions shown, 18 images · IV contrast (APPLIED)
Comparison: 10/04/2021

CLINICAL DATA: Laparoscopic appendectomy for perforated
appendicitis

EXAM:
CT ABDOMEN AND PELVIS WITH CONTRAST
TECHNIQUE: Multidetector CT imaging of the abdomen and pelvis was performed
using the standard protocol following bolus administration of
intravenous contrast.
CONTRAST:  80mL OMNIPAQUE IOHEXOL 350 MG/ML SOLN

[Series 2: axial st · axial · 0.85mm/px · z∈[-435,-5]mm · 13 of 102 slices shown, 15 images]
[im 8/102  soft-tissue]
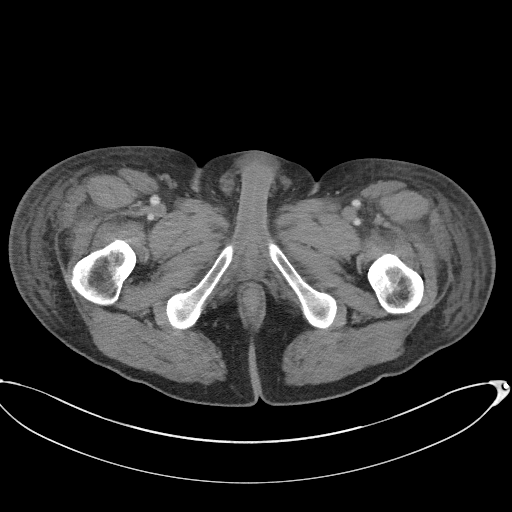
[im 8/102  bone]
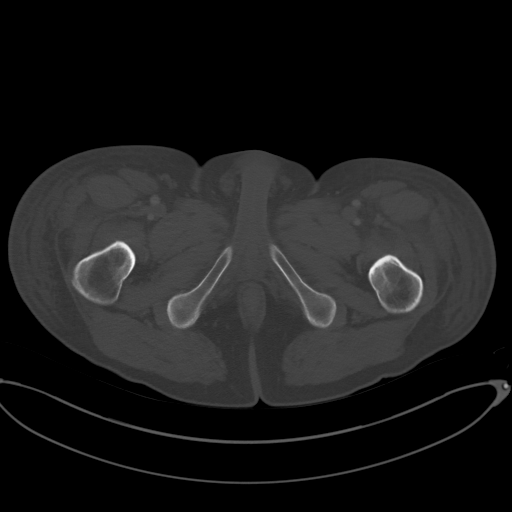
[im 15/102  soft-tissue]
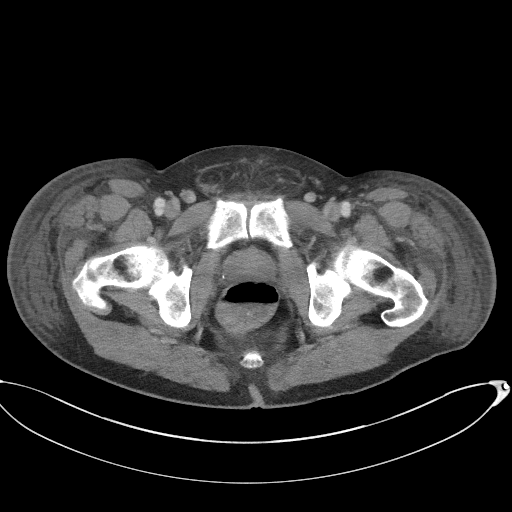
[im 22/102  soft-tissue]
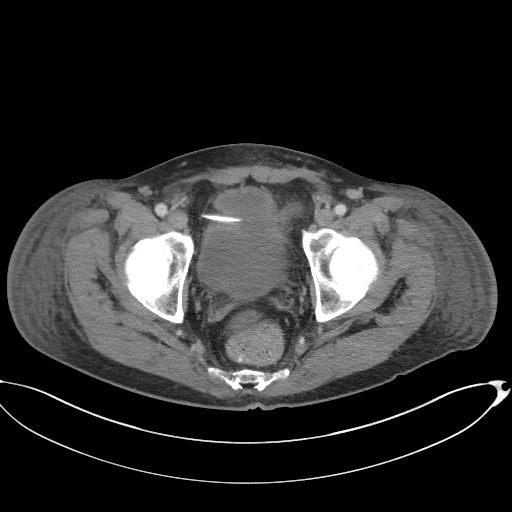
[im 29/102  soft-tissue]
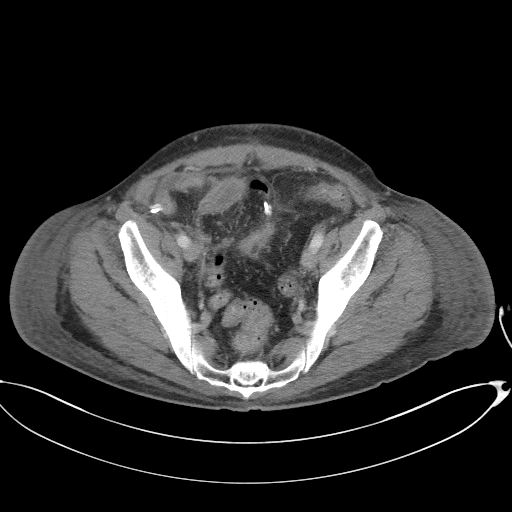
[im 37/102  soft-tissue]
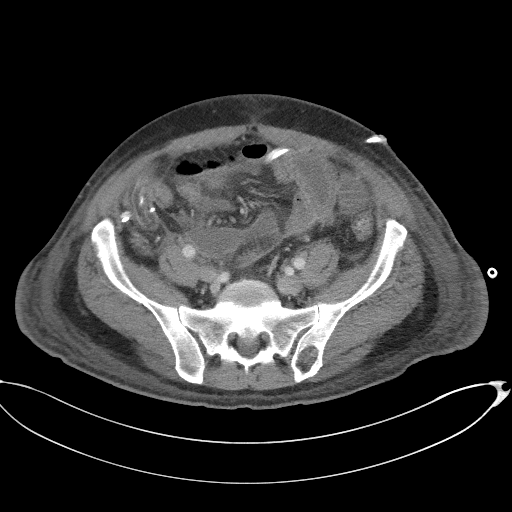
[im 44/102  soft-tissue]
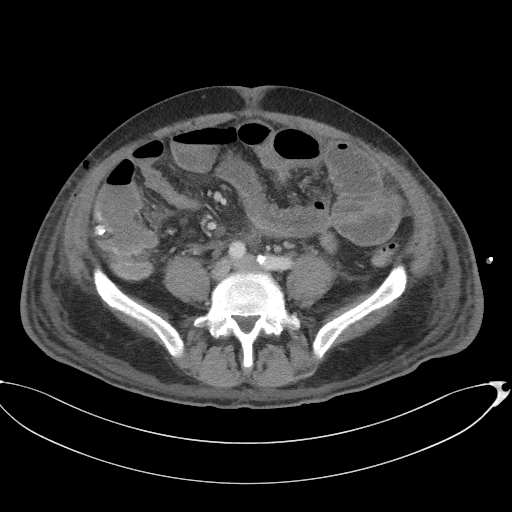
[im 51/102  soft-tissue]
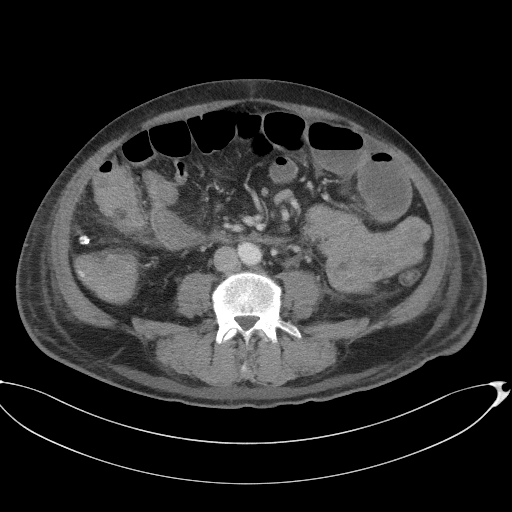
[im 58/102  soft-tissue]
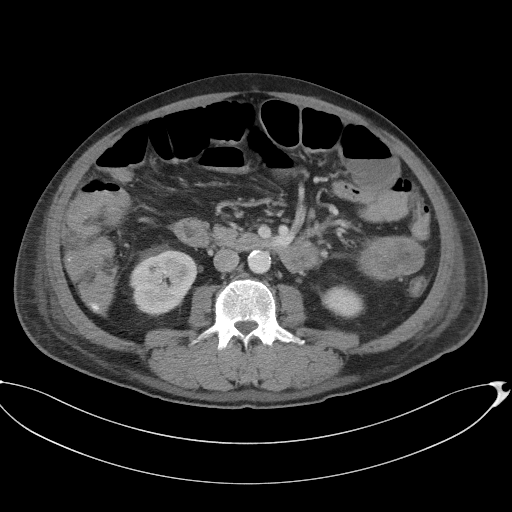
[im 65/102  soft-tissue]
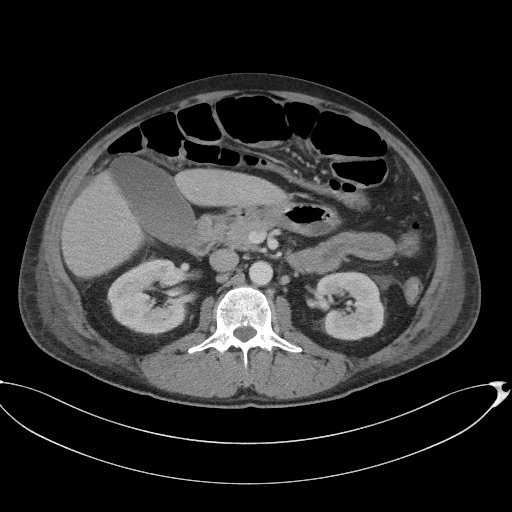
[im 65/102  bone]
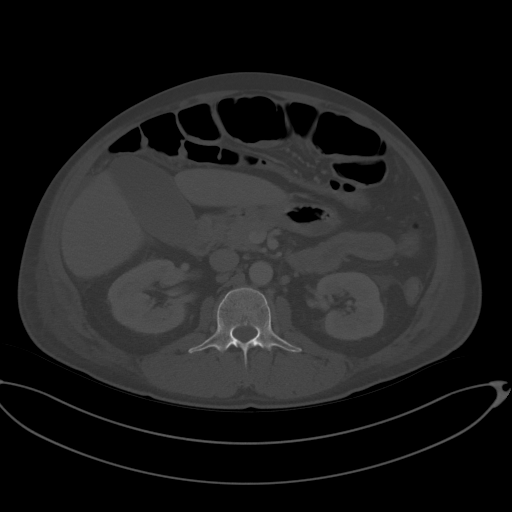
[im 73/102  soft-tissue]
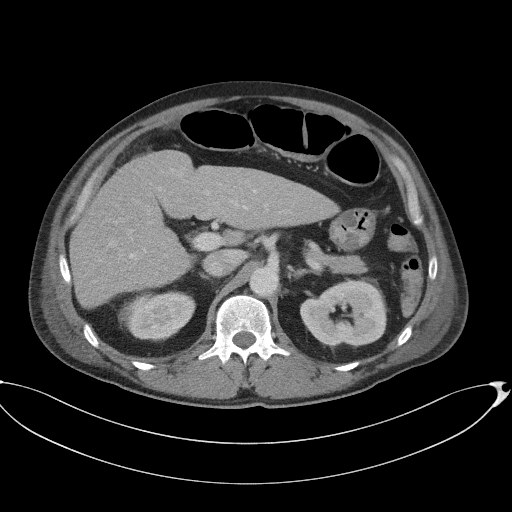
[im 80/102  soft-tissue]
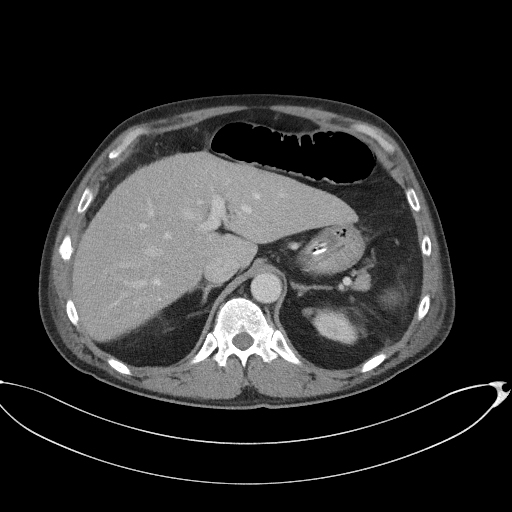
[im 87/102  soft-tissue]
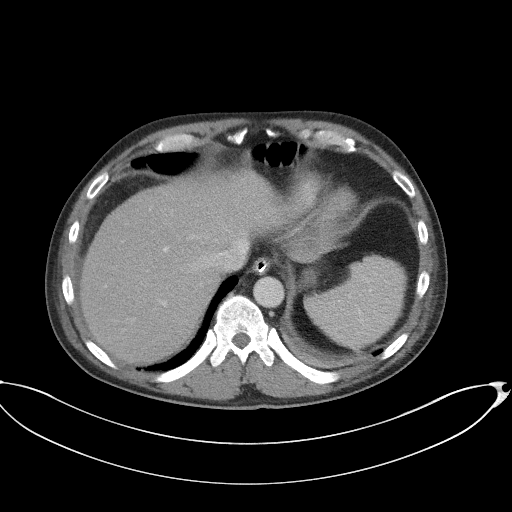
[im 94/102  soft-tissue]
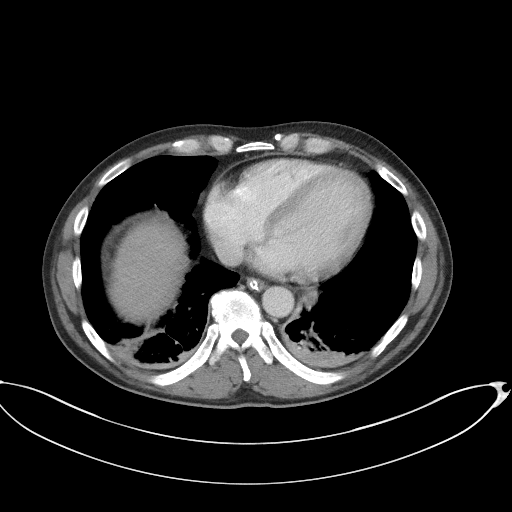

[Series 4: coronal st · coronal · 0.84mm/px · 3 of 97 slices shown]
[im 33/97  soft-tissue]
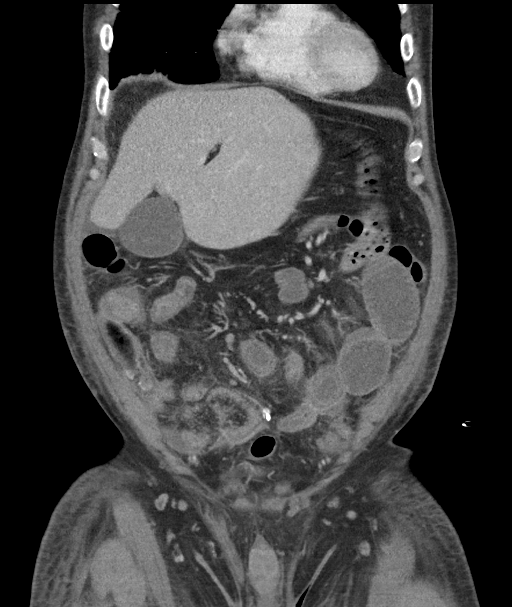
[im 43/97  soft-tissue]
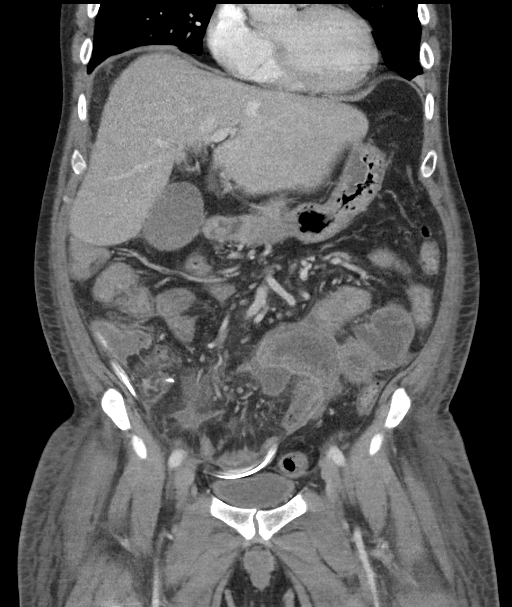
[im 54/97  soft-tissue]
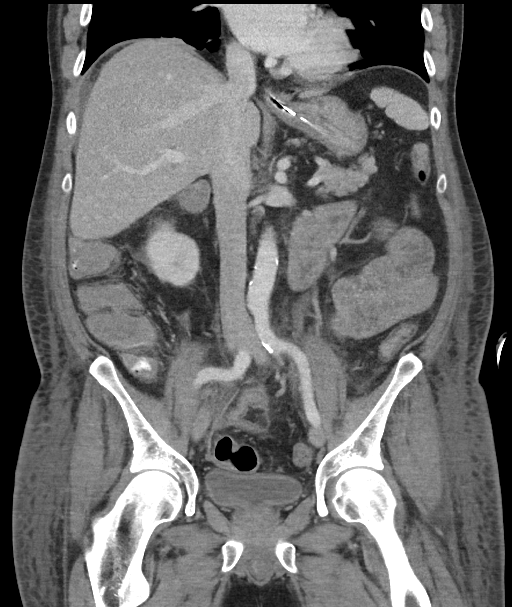

[16 of 46 positions shown; findings below may reference images not displayed]

FINDINGS: Lower chest: Scattered atelectasis within the lower lobes. No
airspace disease or effusion.

Hepatobiliary: Stable hepatic steatosis. The gallbladder is
moderately distended without cholelithiasis or cholecystitis.

Pancreas: Unremarkable. No pancreatic ductal dilatation or
surrounding inflammatory changes.

Spleen: Normal in size without focal abnormality.

Adrenals/Urinary Tract: Stable indeterminate 0.7 cm lesion off the
upper pole left kidney. Small right renal cyst unchanged. No urinary
tract calculi or obstructive uropathy. The adrenals and bladder are
normal.

Stomach/Bowel: Postsurgical changes are seen from appendectomy.
Enteric catheter within the gastric lumen. Dilated loops of proximal
small bowel are seen measuring up to 4 cm in diameter. Decreased
caliber of the distal jejunum and ileum. Findings are suspicious for
small-bowel obstruction, likely due to adhesions.

Vascular/Lymphatic: Aortic atherosclerosis. No enlarged abdominal or
pelvic lymph nodes.

Reproductive: Prostate is unremarkable.

Other: Localized fluid within the central upper pelvis reference
image 65 measuring 7.3 x 2.0 cm. There is faint peripheral
enhancement, this may reflect developing abscess.

No free intraperitoneal gas. Surgical drain enters the abdomen in
the left lower quadrant, extending across the pelvis with tip in the
right paracolic gutter.

Musculoskeletal: No acute or destructive bony lesions. Reconstructed
images demonstrate no additional findings.
IMPRESSION: 1. Postsurgical changes from appendectomy.
2. Rim enhancing fluid collection within the central upper pelvis,
suspicious for developing abscess.
3. Small-bowel obstruction, transition in the region of the distal
jejunum and ileum likely due to adhesions.
4. Hepatic steatosis.
5. Indeterminate 0.7 cm left frontal mass, for which dedicated
nonemergent renal MRI follow-up recommended.
6.  Aortic Atherosclerosis ([id number]).

## 2023-08-15 MED ORDER — AMOXICILLIN 500 MG PO CAPS: 500.0000 mg | ORAL_CAPSULE | Freq: Three times a day (TID) | ORAL | 0 refills | Status: AC

## 2023-08-15 MED ORDER — OXYCODONE-ACETAMINOPHEN 5-325 MG PO TABS: 1.0000 | ORAL_TABLET | Freq: Four times a day (QID) | ORAL | 0 refills | Status: AC | PRN

## 2023-08-15 MED ORDER — OXYCODONE-ACETAMINOPHEN 5-325 MG PO TABS
1.0000 | ORAL_TABLET | Freq: Once | ORAL | Status: AC
  Administered 2023-08-15: 1 via ORAL
  Filled 2023-08-15: qty 1

## 2023-08-15 MED ORDER — SODIUM CHLORIDE (PF) 0.9 % IJ SOLN
INTRAMUSCULAR | Status: AC
  Filled 2023-08-15: qty 50

## 2023-08-15 MED ORDER — IOHEXOL 300 MG/ML  SOLN
75.0000 mL | Freq: Once | INTRAMUSCULAR | Status: AC | PRN
  Administered 2023-08-15: 75 mL via INTRAVENOUS

## 2023-08-15 NOTE — ED Provider Notes (Signed)
[place] EMERGENCY DEPARTMENT AT [facility] Provider Note   CSN: [id number] Arrival date & time: 08/15/23  0045     History  Chief Complaint  Patient presents with   Dental Pain   Shortness of Breath    Joshua Cooper is a 47 y.o. male.  HPI   Patient with medical history including testicular cancer anxiety, syncope, pain with complaints of right-sided dental pain as well as dyspnea.  Patient states that patient had chipped his bottom tooth few weeks ago, but yesterday his pain got a lot worse, he noted some swelling in his mouth is having worsening pain.  He states he is having trouble seeing dentist since he went last few weeks.  Denies any tongue throat lip swelling difficulty breathing.  Patient also notes that he is having some shortness of breath, started yesterday, states he feels chest is tight, denies actual any chest pain, but admits to pleuritic chest pain, has slight nonproductive cough, not fevers or chills, states that his asthma is feels slightly worse.  No history PEs or DVTs, he has no cardiac history, his only complaints.  Home Medications Prior to Admission medications   Medication Sig Start Date End Date Taking? Authorizing Provider  albuterol (VENTOLIN HFA) 108 (90 Base) MCG/ACT inhaler Inhale 2 puffs into the lungs every 4 (four) hours as needed for wheezing or shortness of breath. 06/10/23   Storm Frisk, MD  gabapentin (NEURONTIN) 600 MG tablet Take 1 tablet (600 mg total) by mouth 3 (three) times daily. 01/22/23   Storm Frisk, MD  mirtazapine (REMERON) 7.5 MG tablet Take 1 tablet (7.5 mg total) by mouth at bedtime. 01/22/23   Storm Frisk, MD  QUEtiapine (SEROQUEL) 50 MG tablet Take 1 tablet (50 mg total) by mouth at bedtime. 06/06/23   Meta Hatchet, PA  venlafaxine XR (EFFEXOR-XR) 75 MG 24 hr capsule Take 3 capsules (225 mg total) by mouth daily with breakfast. 01/22/23   Storm Frisk, MD      Allergies    Morphine and  codeine    Review of Systems   Review of Systems  Constitutional:  Negative for chills and fever.  HENT:  Positive for dental problem.   Respiratory:  Positive for chest tightness and shortness of breath.   Cardiovascular:  Negative for chest pain.  Gastrointestinal:  Negative for abdominal pain.  Neurological:  Negative for headaches.    Physical Exam Updated Vital Signs BP (!) 135/93   Pulse 66   Temp 98.3 F (36.8 C) (Oral)   Resp 13   Ht 5\' 11"  (1.803 m)   Wt 70.3 kg   SpO2 98%   BMI 21.62 kg/m  Physical Exam Vitals and nursing note reviewed.  Constitutional:      General: He is not in acute distress.    Appearance: He is not ill-appearing.  HENT:     Head: Normocephalic and atraumatic.     Nose: No congestion.     Mouth/Throat:     Mouth: Mucous membranes are moist.     Pharynx: Oropharynx is clear. No oropharyngeal exudate or posterior oropharyngeal erythema.     Comments: Patient has some right sided facial swelling, with no overlying skin changes, no trismus no torticollis, tongue uvula both midline controlling secretions there is no submandibular swelling, patient has poor dental hygiene, there is significant dental decay in the right bottom molar, no palpable fluctuance induration. Eyes:     Conjunctiva/sclera: Conjunctivae normal.  Cardiovascular:  Rate and Rhythm: Normal rate and regular rhythm.     Pulses: Normal pulses.     Heart sounds: No murmur heard.    No friction rub. No gallop.  Pulmonary:     Effort: No respiratory distress.     Breath sounds: No wheezing, rhonchi or rales.  Abdominal:     Palpations: Abdomen is soft.     Tenderness: There is no abdominal tenderness. There is no right CVA tenderness or left CVA tenderness.  Musculoskeletal:     Right lower leg: No edema.     Left lower leg: No edema.  Skin:    General: Skin is warm and dry.  Neurological:     Mental Status: He is alert.  Psychiatric:        Mood and Affect: Mood  normal.     ED Results / Procedures / Treatments   Labs (all labs ordered are listed, but only abnormal results are displayed) Labs Reviewed  CBG MONITORING, ED - Abnormal; Notable for the following components:      Result Value   Glucose-Capillary 121 (*)    All other components within normal limits  SARS CORONAVIRUS 2 BY RT PCR  CBC WITH DIFFERENTIAL/PLATELET  BASIC METABOLIC PANEL  TROPONIN I (HIGH SENSITIVITY)    EKG EKG Interpretation Date/Time:  Friday August 15 2023 01:06:03 EDT Ventricular Rate:  91 PR Interval:  182 QRS Duration:  82 QT Interval:  357 QTC Calculation: 440 R Axis:   69  Text Interpretation: Sinus rhythm Confirmed by Nicanor Alcon, April ([id number]) on 08/15/2023 3:35:49 AM  Radiology DG Chest 2 View  Result Date: 08/15/2023 CLINICAL DATA:  Shortness of breath EXAM: CHEST - 2 VIEW COMPARISON:  08/13/2022 FINDINGS: Cardiac shadow is within normal limits. Lungs are hyperinflated without focal infiltrate or effusion. No bony abnormality is noted. IMPRESSION: Hyperinflation without acute abnormality. Electronically Signed   By: Alcide Clever M.D.   On: 08/15/2023 01:51    Procedures Procedures    Medications Ordered in ED Medications - No data to display  ED Course/ Medical Decision Making/ A&P                                 Medical Decision Making Amount and/or Complexity of Data Reviewed Radiology: ordered.   This patient presents to the ED for concern of dental pain shortness of breath, this involves an extensive number of treatment options, and is a complaint that carries with it a high risk of complications and morbidity.  The differential diagnosis includes Ludwig angina, periorbital cellulitis, PE, ACS, pneumonia    Additional history obtained:  Additional history obtained from N/A External records from outside source obtained and reviewed including oncology  notes   Co morbidities that complicate the patient evaluation  Testicular  cancer, asthma  Social Determinants of Health:  N/A    Lab Tests:  I Ordered, and personally interpreted labs.  The pertinent results include: COVID test negative, CBG 121   Imaging Studies ordered:  I ordered imaging studies including chest x-ray, CT of chest I independently visualized and interpreted imaging which showed chest  X-ray is negative I agree with the radiologist interpretation   Cardiac Monitoring:  The patient was maintained on a cardiac monitor.  I personally viewed and interpreted the cardiac monitored which showed an underlying rhythm of: EKG that signs of ischemia   Medicines ordered and prescription drug management:  I ordered medication including  N/A I have reviewed the patients home medicines and have made adjustments as needed  Critical Interventions:  N/A   Reevaluation:  Presents with dental pain, likely has a dental infection, will start antibiotics, due to patient's history of testicular cancer, new onset shortness of breath, concern for possible PE, will obtain basic lab workup, send on for CT of chest for further assessment.  Consultations Obtained:  N/a    Test Considered:  N/a    Rule out I have low suspicion for peritonsillar abscess, retropharyngeal abscess, or Ludwig angina as oropharynx was visualized tongue and uvula were both midline, there is no exudates, erythema or edema noted in the posterior pillars or on/ around tonsils.  Low suspicion for an abscess as gumline were palpated no fluctuance or induration felt.  Low suspicion for periorbital or orbital cellulitis as patient face had no erythematous, patient EOMs were intact, he had no pain with eye movement.     Dispostion and problem list  Due to shift change patient have to [facility]   Follow-up on lab workup and CT of chest, if unremarkable, patient discharged home, treat for dental infection follow-up with a dentist.            Final Clinical  Impression(s) / ED Diagnoses Final diagnoses:  Pain, dental  Shortness of breath    Rx / DC Orders ED Discharge Orders     None         Carroll Sage, PA-C 08/15/23 0749    Palumbo, April, MD 08/15/23 2312

## 2023-08-15 NOTE — Discharge Instructions (Addendum)
Please take your antibiotics as prescribed. Take tylenol/ibuprofen for pain. I recommend close follow-up with a dentist and PCP for reevaluation.  Please do not hesitate to return to emergency department if worrisome signs symptoms we discussed become apparent.

## 2023-08-15 NOTE — ED Provider Notes (Signed)
Patient's care resumed at shift change from [place], [place].  For full HPI, please refer to previous provider note.  Plan is to reassess after CT angio results.  Anticipate discharge with antibiotics for dental pain if CT angio is negative.  Physical Exam  BP (!) 133/94   Pulse 64   Temp 98.3 F (36.8 C) (Oral)   Resp 14   Ht 5\' 11"  (1.803 m)   Wt 70.3 kg   SpO2 97%   BMI 21.62 kg/m   Physical Exam Vitals and nursing note reviewed.  Constitutional:      Appearance: Normal appearance.  HENT:     Head: Normocephalic and atraumatic.     Comments: Patient has some right sided facial swelling, with no overlying skin changes, no trismus no torticollis, tongue uvula both midline controlling secretions there is no submandibular swelling, patient has poor dental hygiene, there is significant dental decay in the right bottom molar, no palpable fluctuance induration.    Mouth/Throat:     Mouth: Mucous membranes are moist.  Eyes:     General: No scleral icterus. Cardiovascular:     Rate and Rhythm: Normal rate and regular rhythm.     Pulses: Normal pulses.     Heart sounds: Normal heart sounds.  Pulmonary:     Effort: Pulmonary effort is normal.     Breath sounds: Normal breath sounds.  Abdominal:     General: Abdomen is flat.     Palpations: Abdomen is soft.     Tenderness: There is no abdominal tenderness.  Musculoskeletal:        General: No deformity.  Skin:    General: Skin is warm.     Findings: No rash.  Neurological:     General: No focal deficit present.     Mental Status: He is alert.  Psychiatric:        Mood and Affect: Mood normal.     Procedures  Procedures  ED Course / MDM    Medical Decision Making Amount and/or Complexity of Data Reviewed Labs: ordered. Radiology: ordered.  Risk Prescription drug management.   47 year old male presents with a chief complaint of dental pain and shortness of breath.  Patient has poor dental hygiene.  There is  significant dental decay in the right bottom molar but no palpable fluctuance or induration appreciated for I&D.  CT angio chest showed possible left upper lobe pneumonia.  Will treat with amoxicillin.  Advised patient to take his antibiotics as prescribed, take Tylenol/ibuprofen for pain, follow-up with a dentist and his PCP for reevaluation and management.  Strict precaution discussed.  Patient is stable for discharge.  Disposition Continued outpatient therapy. Follow-up with a dentist and PCP recommended for reevaluation of symptoms. Treatment plan discussed with patient.  Pt acknowledged understanding was agreeable to the plan. Worrisome signs and symptoms were discussed with patient, and patient acknowledged understanding to return to the ED if they noticed these signs and symptoms. Patient was stable upon discharge.   This chart was dictated using voice recognition software.  Despite best efforts to proofread,  errors can occur which can change the documentation meaning.         Jeanelle Malling, PA 08/15/23 1039    Laurence Spates, MD 08/15/23 [phone]

## 2023-08-15 NOTE — ED Triage Notes (Signed)
Arrives with main complaint of right lower dental pain of a few days with swelling the last 2. Says pain has became intolerable.   Also says he has felt short of  breath the last couple days. His therapist has been diagnosed with covid.
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
## 2023-08-21 IMAGING — DX DG CHEST 1V PORT
1 series · 1 of 1 positions shown · non-contrast
Comparison: Chest x-ray 09/16/2021.

CLINICAL DATA: Leukocytosis.

EXAM:
PORTABLE CHEST 1 VIEW

[chest ap]
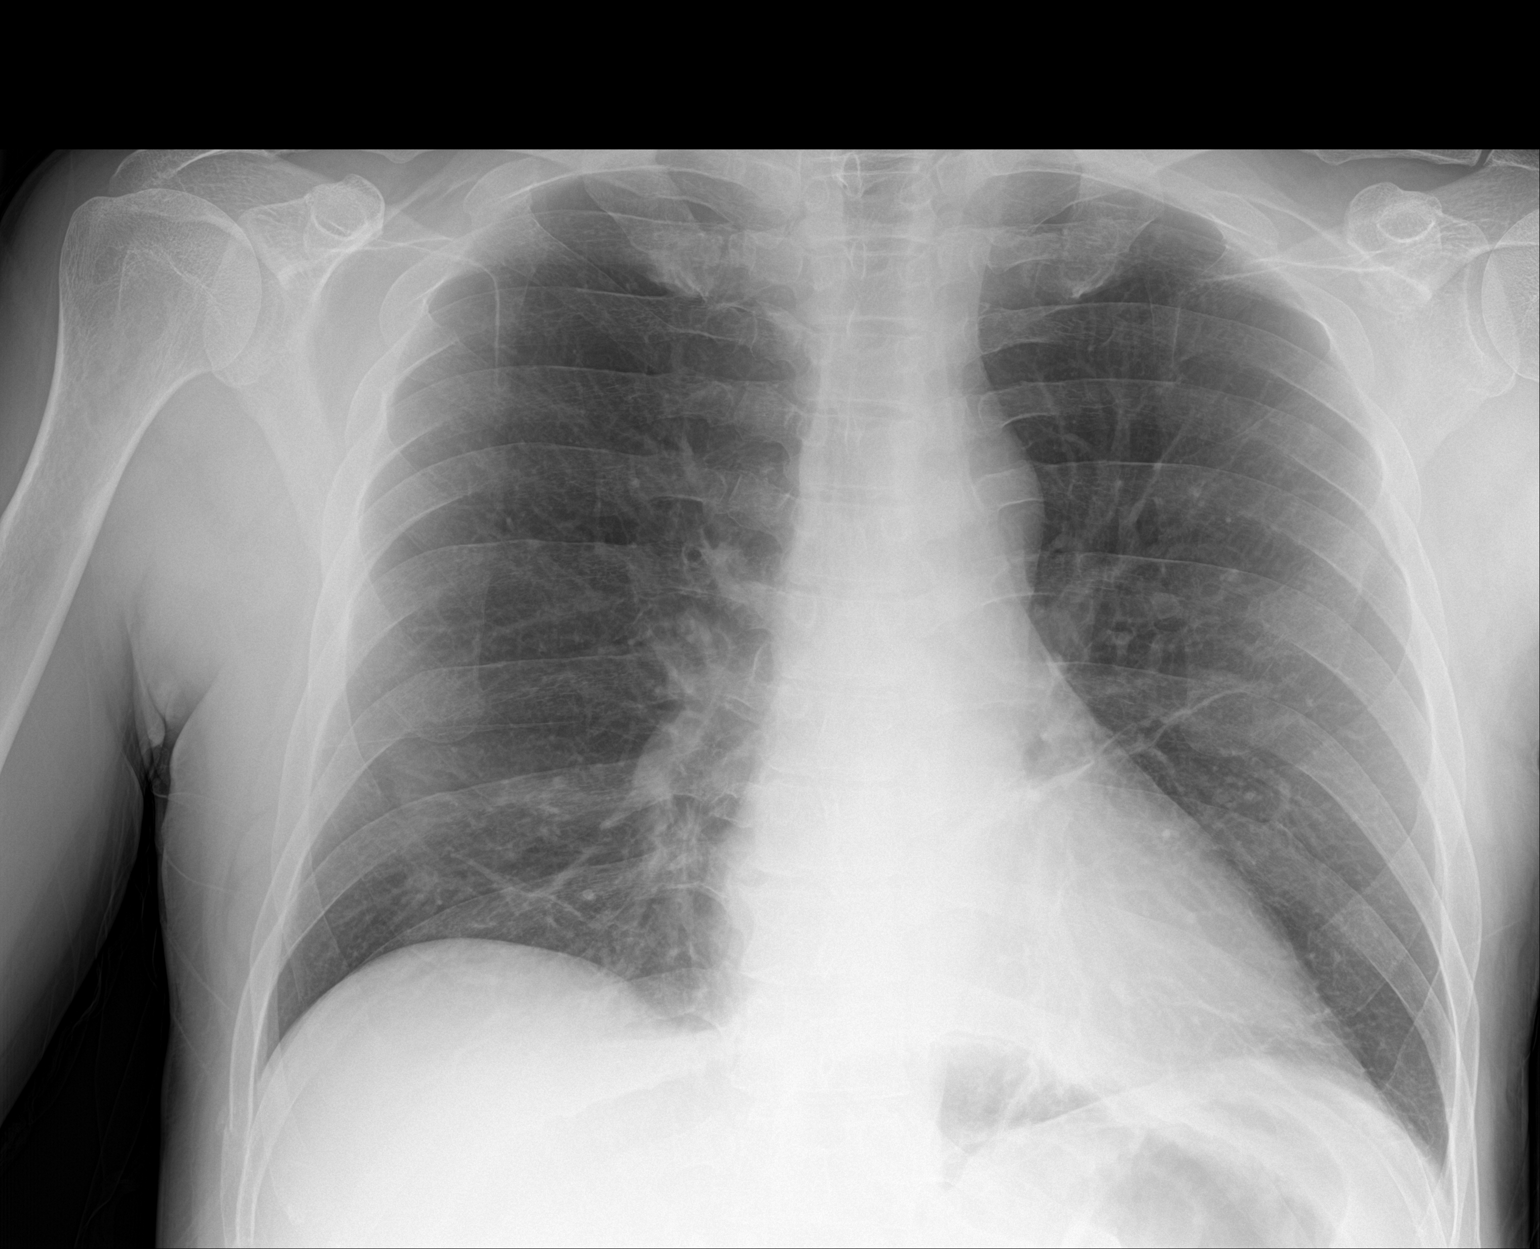

[1 of 1 positions shown; findings below may reference images not displayed]

FINDINGS: There is linear atelectasis or scarring in the left mid and lower
lung. There is some faint patchy opacities in the right lower lung.
Costophrenic angles are clear. There is no pneumothorax. The
cardiomediastinal silhouette is within normal limits. No acute
fractures are seen. Cervical spinal fusion hardware is partially
visualized.
IMPRESSION: 1. Faint hazy opacities in the right lower lung may be related to
infection.

## 2023-08-21 IMAGING — CT CT ABD-PELV W/ CM
2 of 5 series · 15 of 46 positions shown, 17 images · IV contrast (APPLIED)
Comparison: CT abdomen and pelvis 10/09/2021.

CLINICAL DATA: Abdominal infection suspected. Postop day 11 status
post laparoscopic appendectomy.

EXAM:
CT ABDOMEN AND PELVIS WITH CONTRAST
TECHNIQUE: Multidetector CT imaging of the abdomen and pelvis was performed
using the standard protocol following bolus administration of
intravenous contrast.
CONTRAST:  75mL OMNIPAQUE IOHEXOL 350 MG/ML SOLN

[Series 2: axial st · axial · 0.74mm/px · z∈[-511,-81]mm · 12 of 100 slices shown, 14 images]
[im 7/100  soft-tissue]
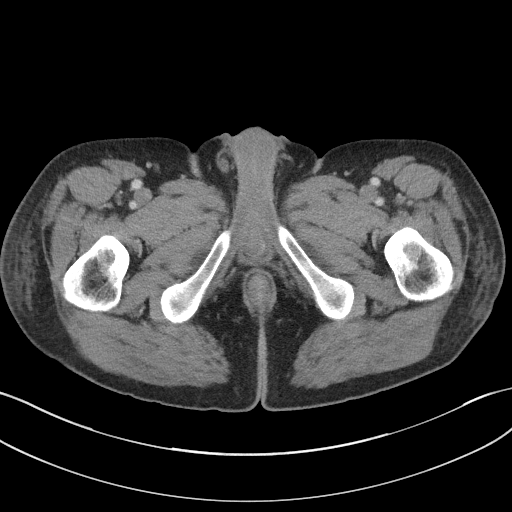
[im 7/100  bone]
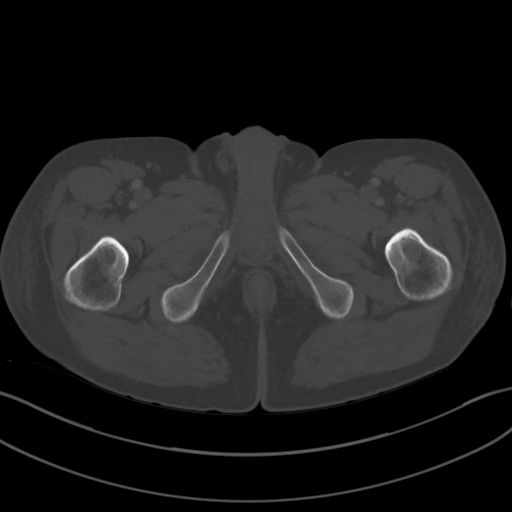
[im 14/100  soft-tissue]
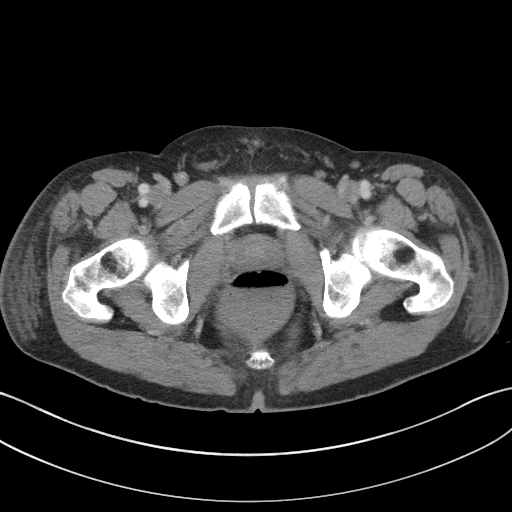
[im 20/100  soft-tissue]
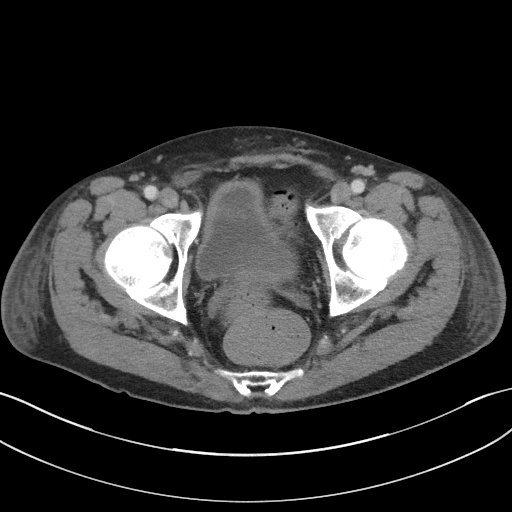
[im 34/100  soft-tissue]
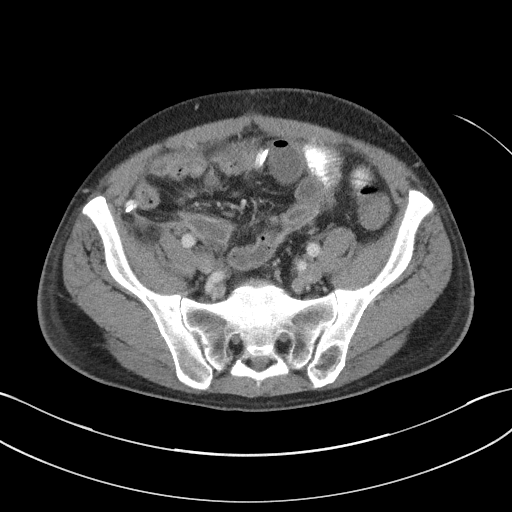
[im 40/100  soft-tissue]
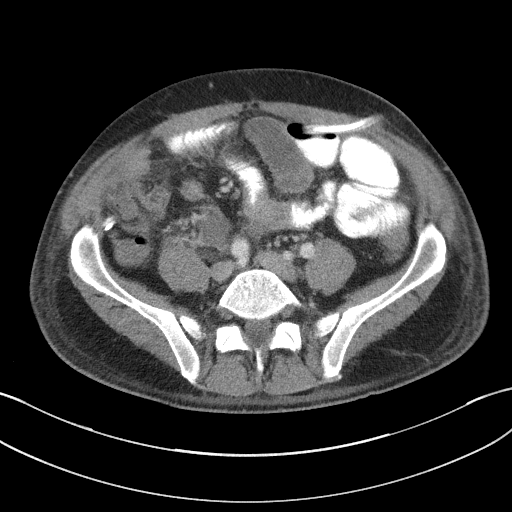
[im 47/100  soft-tissue]
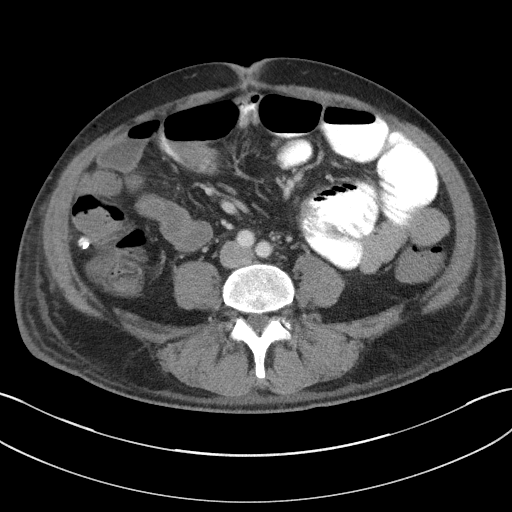
[im 53/100  soft-tissue]
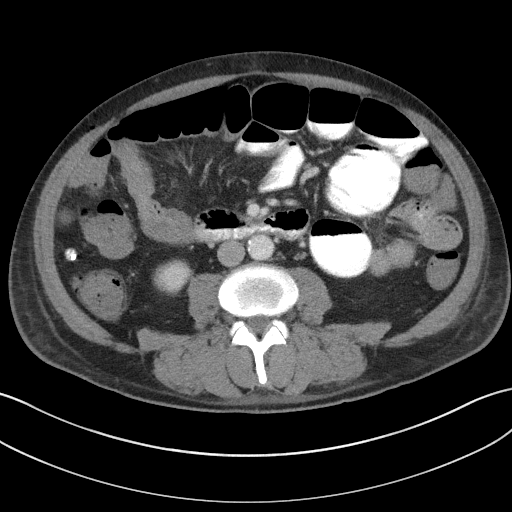
[im 60/100  soft-tissue]
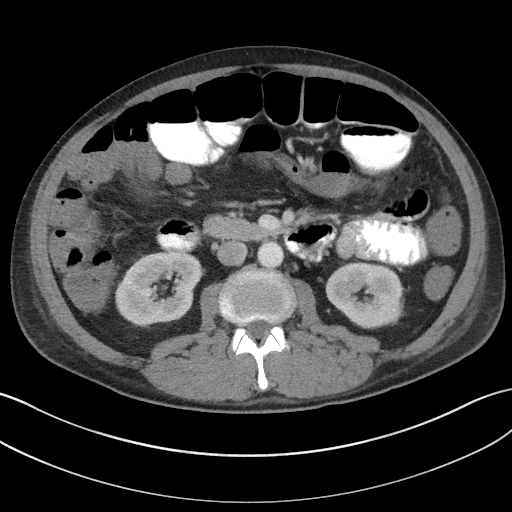
[im 67/100  soft-tissue]
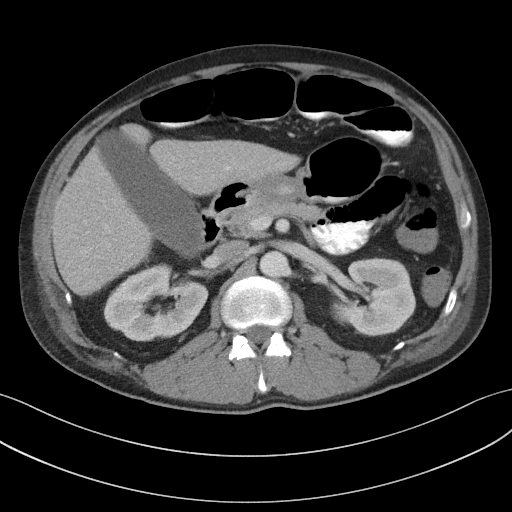
[im 67/100  bone]
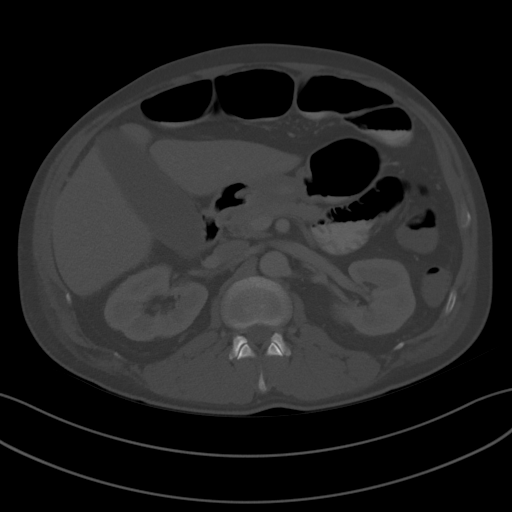
[im 80/100  soft-tissue]
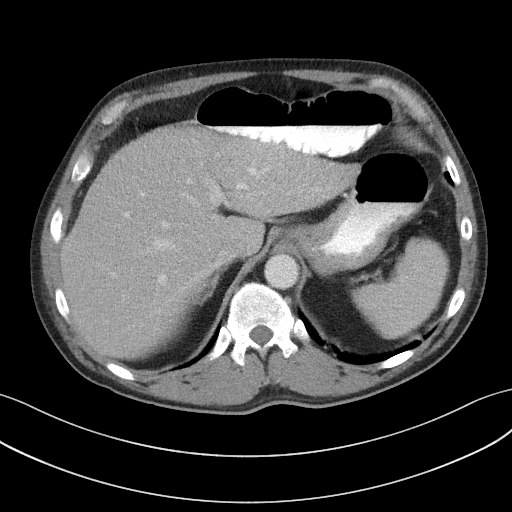
[im 86/100  soft-tissue]
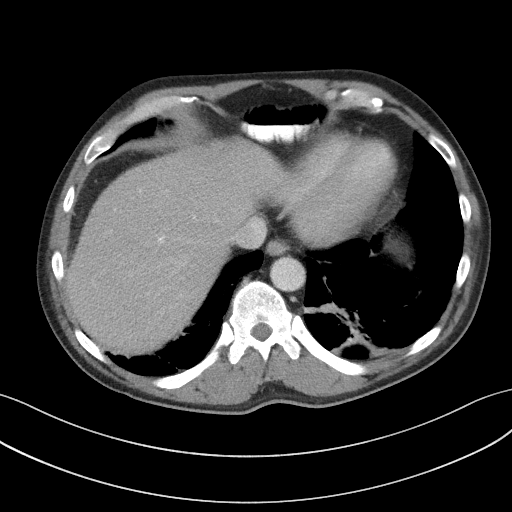
[im 93/100  soft-tissue]
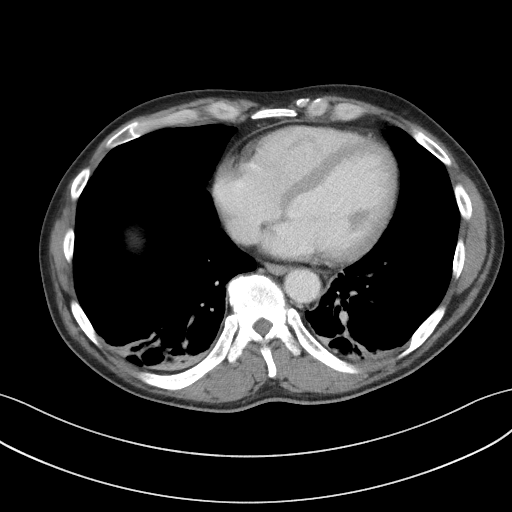

[Series 4: coronal st · coronal · 0.76mm/px · 3 of 91 slices shown]
[im 31/91  soft-tissue]
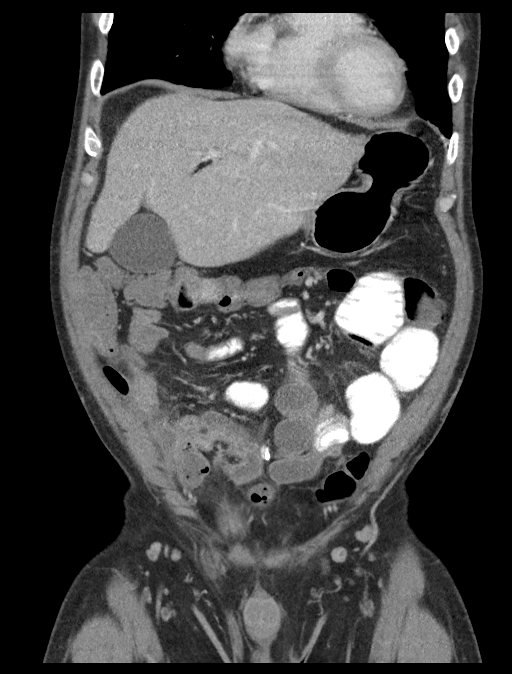
[im 41/91  soft-tissue]
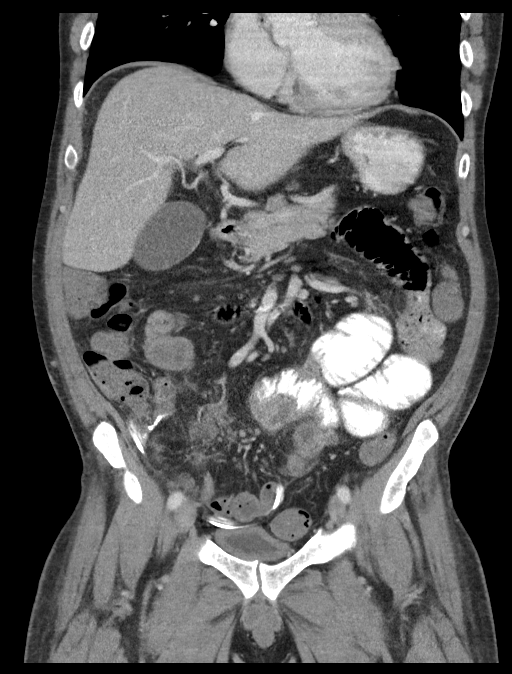
[im 51/91  soft-tissue]
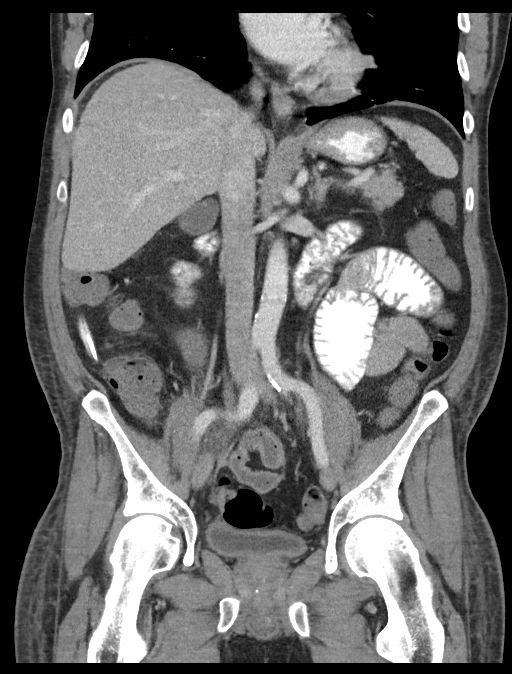

[15 of 46 positions shown; findings below may reference images not displayed]

FINDINGS: Lower chest: There are atelectatic changes in the lung bases.

Hepatobiliary: No focal liver abnormality is seen. No gallstones,
gallbladder wall thickening, or biliary dilatation.

Pancreas: Unremarkable. No pancreatic ductal dilatation or
surrounding inflammatory changes.

Spleen: Normal in size without focal abnormality.

Adrenals/Urinary Tract: Indeterminate subcentimeter hypodense lesion
in the superior pole the left kidney is unchanged. Small right renal
cyst is unchanged. The kidneys, adrenal glands and bladder are
otherwise within normal limits.

Stomach/Bowel: There are dilated jejunal loops with air-fluid levels
measuring up to 5.5 cm. Degree of distention is mildly increased.
The stomach is nondilated. No definitive transition point is seen,
but oral contrast is seen beyond the transition within nondilated
central small bowel. Distal small bowel and colon are nondilated.
Anastomotic site is seen in the right lower quadrant. Colon is
nondilated. Appendix is not seen.

Vascular/Lymphatic: No significant vascular findings are present. No
enlarged abdominal or pelvic lymph nodes.

Reproductive: Prostate is unremarkable.

Other: Percutaneous drainage catheter is again seen entering the
left lower quadrant anterior abdominal wall. The distal catheter tip
is in the lateral right abdomen, unchanged from the prior
examination. There is a wall enhancing fluid collection in the lower
central pelvis approximating small bowel loops. This collection is
similar in size when compared to the prior study measuring 5.9 x
x 2.8 cm images 2/64 and 4/46. No new separate enhancing fluid
collections are identified. Catheter does not approximate this
collection. There are few tiny bubbles of free air in the right
lower quadrant adjacent to the ileocolic region with mild
inflammatory stranding. There is no focal abdominal wall hernia.
There is mild body wall edema.

Musculoskeletal: No acute or significant osseous findings.
IMPRESSION: 1. Enhancing pelvic fluid collection is unchanged in size worrisome
for pelvic abscess. Note is made that the drainage catheter does not
approximate this fluid collection.
2. Tiny foci of free air in the right lower quadrant with
surrounding stranding in the ileocolic region which may be related
to recent surgery. Micro perforation is not excluded.
3. Dilated jejunal loops. Contrast is seen within nondilated mid
small bowel. Findings may represent partial small bowel obstruction
versus ileus. Continued follow-up recommended.
4. Unchanged indeterminate small left renal lesion. Recommend
follow-up MRI.

## 2023-08-22 ENCOUNTER — Other Ambulatory Visit: Payer: Medicaid Other | Admitting: Psychology

## 2023-08-29 ENCOUNTER — Ambulatory Visit (INDEPENDENT_AMBULATORY_CARE_PROVIDER_SITE_OTHER): Payer: Medicaid Other | Admitting: Psychology

## 2023-08-29 DIAGNOSIS — F4310 Post-traumatic stress disorder, unspecified: Secondary | ICD-10-CM

## 2023-08-29 DIAGNOSIS — F411 Generalized anxiety disorder: Secondary | ICD-10-CM

## 2023-08-29 DIAGNOSIS — F3181 Bipolar II disorder: Secondary | ICD-10-CM | POA: Diagnosis not present

## 2023-09-02 NOTE — Telephone Encounter (Signed)
Joshua Cooper with Social work called and stated she is in contact with this pt and she can be reached at this number to schedule this echo for this pt.    Best number [phone]

## 2023-09-02 NOTE — Telephone Encounter (Signed)
Error

## 2023-09-05 ENCOUNTER — Ambulatory Visit (INDEPENDENT_AMBULATORY_CARE_PROVIDER_SITE_OTHER): Payer: Medicaid Other | Admitting: Psychology

## 2023-09-05 DIAGNOSIS — F411 Generalized anxiety disorder: Secondary | ICD-10-CM | POA: Diagnosis not present

## 2023-09-05 DIAGNOSIS — F3181 Bipolar II disorder: Secondary | ICD-10-CM

## 2023-09-05 DIAGNOSIS — F4310 Post-traumatic stress disorder, unspecified: Secondary | ICD-10-CM | POA: Diagnosis not present

## 2023-09-12 ENCOUNTER — Ambulatory Visit (INDEPENDENT_AMBULATORY_CARE_PROVIDER_SITE_OTHER): Payer: Medicaid Other | Admitting: Psychology

## 2023-09-12 DIAGNOSIS — F3181 Bipolar II disorder: Secondary | ICD-10-CM | POA: Diagnosis not present

## 2023-09-12 DIAGNOSIS — F4310 Post-traumatic stress disorder, unspecified: Secondary | ICD-10-CM | POA: Diagnosis not present

## 2023-09-12 DIAGNOSIS — F411 Generalized anxiety disorder: Secondary | ICD-10-CM

## 2023-09-19 ENCOUNTER — Ambulatory Visit (INDEPENDENT_AMBULATORY_CARE_PROVIDER_SITE_OTHER): Payer: Medicaid Other | Admitting: Psychology

## 2023-09-19 DIAGNOSIS — F4310 Post-traumatic stress disorder, unspecified: Secondary | ICD-10-CM | POA: Diagnosis not present

## 2023-09-19 DIAGNOSIS — F411 Generalized anxiety disorder: Secondary | ICD-10-CM

## 2023-09-19 DIAGNOSIS — F3181 Bipolar II disorder: Secondary | ICD-10-CM

## 2023-09-26 ENCOUNTER — Other Ambulatory Visit: Payer: Medicaid Other | Admitting: Psychology

## 2023-10-03 ENCOUNTER — Ambulatory Visit (INDEPENDENT_AMBULATORY_CARE_PROVIDER_SITE_OTHER): Payer: Medicaid Other | Admitting: Psychology

## 2023-10-03 DIAGNOSIS — F4310 Post-traumatic stress disorder, unspecified: Secondary | ICD-10-CM

## 2023-10-03 DIAGNOSIS — F3181 Bipolar II disorder: Secondary | ICD-10-CM | POA: Diagnosis not present

## 2023-10-03 DIAGNOSIS — F411 Generalized anxiety disorder: Secondary | ICD-10-CM | POA: Diagnosis not present

## 2023-10-09 ENCOUNTER — Other Ambulatory Visit: Payer: Medicaid Other | Admitting: Psychology

## 2023-10-16 NOTE — Progress Notes (Signed)
SOAP Notes Session Summary Provider / Clinician's Name: [name], [place]  Client Name: Joshua Cooper  Date of Service: 07/25/2023  Duration: 60 mins.    Subjective: Client reports, "I am doing meh. You know that generally I try to have a positive outlook on things. However, lately it has just been hard. I have come to a place where I know and accept that I am just going to be homeless for a while. With that being said, it does not mean that I am going to give up. I just want to make sure that I am in a better head space to make the situation as comfortable as it can be." Client also reported that, he does not like taking the medication that he takes at the current moment because it makes him sleep anywhere from 12-15 hours and that does not feel safe when it comes to living outside.   Objective: Overall the client seemed to be okay. It does seem that he is wrapping his head around the reality of the homeless situation and trying to find comfort in that, even if in a small way. He does finally seem to feel as safe as one can feel when it comes to living outside. He is content where he is and he reports that it is quiet out there. It is imperative that he does not give up hope. We have made a lot of progress and it is important that he loose that momentum. It is good that he has found acceptance and peace but also not afraid to say that he has mixed feelings surrounding this.   Assessment: Client responds well to treatment. It is a safe place that he can come in and let out his emotions. He can share anything that he wants to share and it is apparent that he feels comfortable doing so. It is also clear that progress is being made because the client is starting to look at his situation in different ways and he is able to say this is not ideal but I am going to make the best of it for now, all while continuing to make progress and move forward. It is very important that his medication  gets figured out. Medication compliance is of utmost importance to maintain his mental health.   Plan: Continued weekly therapy sessions with a goal of 4 times per month, shifting as needed. Client will practice mindfulness and box breathing when needed to help calm body, mind, and spirit. Continue writing all thoughts and ideas down to establish patterns and triggers during emotional deregulation. In one month we will begin looking to establish patterns that are in association with the emotions and triggers. In these emotional moments client states that he feels he is at a 10 and would like to begin working to bring this number down to a 2 with the use of interventions.

## 2023-10-17 ENCOUNTER — Other Ambulatory Visit: Payer: Medicaid Other | Admitting: Psychology

## 2023-10-21 NOTE — Progress Notes (Signed)
Treatment Plan The Counseling Treatment Plan is a dynamic document that is regularly reviewed and updated as the client's needs and circumstances change over the course of counseling. The plan serves as a roadmap for the counseling process and helps ensure that the client and counselor work together toward common goals.  Section I. Client Information Client name: Joshua Cooper  Established Date: 08/01/2023 Therapist name:Millena Callins Annia Friendly [place], [place]  Section II. Presenting Concerns  Brief Description of Concerns Client is experiencing homelessness. The main focus is meeting basic needs. Not meeting these causes a decline in his mental health, the hypomania of bi-polar 2, as well as concurrent depressive episodes become more apparent. Symptoms of PTSD and GAD are also present.  Goals for Counseling  1) Developing coping skills 2) Identifying triggers 3) Reducing symptoms  Section Ill. Assessment Summary  Summary of Assessment Findings  The main need for the client is access to housing. Meanwhile focusing on goals and interventions that provide the best outcome for mental health stability are of utmost importace for client to remain at baseline.   Strengths and Resources  Strengths include resiliency, bravery, mostly has a good outlook even when things are very tough, persistent, strong, and kind.   Resources: [name], [employer] Joni Reining), his daughters, myself   Areas for Improvement  Decrease stress, follow through, being kind to self.   Section IV. Treatment Objectives Measurable objectives Strategies/Interventions 1) Attend weekly therapy sessions that focus on Cognitive Behavioral Therapy. With a goal of 4 sessions per month. 2) Reduce symptoms from a level from 8 to 3. Practice mindfulness and box breathing techniques daily. 3)Improving self care habits.  Creating a consistent sleep schedule, incorporating regular physical  activity, improving nutritional habits, and establishing a daily routine to best of ability given situation 4) Understand patterns of behavior through regular journaling for emotional clarity. Spend 10 minutes each day writing down thoughts and feelings to help identify situations that cause emotional irregularity.  Section V. Action Plan  Summary of Strategies/ Interventions Roles and Responsibilities        Timeline for Completion   1) Attend 4 sessions per month. The client will attend each week because it is very valuable for his mental health adapting to what is needed since his circumstances are unpredictable Client- make sure bus pass stays current, sets good times per week that work well for schedule, reduce as many barriers as possible Me- check in and flexibility   Evaluate once a month    2) Decrease anxiety levels from 8 to 3. Practice mindfulness techniques daily learned in therapy. If only for 5 mins a day. Client- committed to 5 mins per day. Me- teach techniques and recenter each session  3 months, check in weekly  3) Improving self-care habits. It is important for the client to maintain any form of normalcy and predictibility that will assist in sustaining his mental health. Client- following through with schedules Me-helping guide schedule  Two weeks, evaluate outcomes  4) Spend 10 minutes a day writing down thoughts and feelings to help understand your emotional patterns better.  Client- spend 10 mins. a day writing   Two weeks and evaluate      Additional notes: Reviewed: current date 10/03/2023 Ongoing, Achieved, Revised

## 2023-10-24 ENCOUNTER — Other Ambulatory Visit: Payer: Medicaid Other | Admitting: Psychology

## 2023-10-28 NOTE — Progress Notes (Signed)
DAP note Client name: Joshua Cooper  Therapist name: [name], [name] Date: 08/08/2023  DATA: Client reports, " I am doing a little bit better this week compared to last. I am not taking my medicine. I just do not feel safe sleeping for as long as the medicine makes me sleep. Being outside, it just is not safe. I started taking my other medicine again because I know that at least it helps and it does not make me sleep like that." The client also discussed his relationship with his daughter and the value and joy that brings into his life.   ASSESSMENT: Client responds well to treatment. It is a safe place that he can come in and let out his emotions. He can share anything that he wants to share and it is apparent that he feels comfortable doing so. He needs to be re-evaluated with his medication for accuracy. However, what he was on was only addressing the bi-polar component and all of it needs to be addressed to ensure the client is getting the overall care that he needs.  It is very important that his medication gets figured out. Medication compliance is of utmost importance to maintain his mental health.    PLAN: Continued weekly therapy sessions with a goal of 4 times per month, shifting as needed. Client will practice mindfulness and box breathing when needed to help calm the body, mind, and spirit. Continue writing all thoughts and ideas down to establish patterns and triggers during emotional deregulation. In one month we will begin looking to establish patterns that are in association with the emotions and triggers. In these emotional moments client states that he feels he is at a 8 this week and  and would like to begin working to bring this number down to a 2 with the use of interventions.

## 2023-10-31 ENCOUNTER — Ambulatory Visit (INDEPENDENT_AMBULATORY_CARE_PROVIDER_SITE_OTHER): Payer: Medicaid Other | Admitting: Psychology

## 2023-10-31 DIAGNOSIS — F4310 Post-traumatic stress disorder, unspecified: Secondary | ICD-10-CM

## 2023-10-31 DIAGNOSIS — F3181 Bipolar II disorder: Secondary | ICD-10-CM

## 2023-10-31 DIAGNOSIS — F411 Generalized anxiety disorder: Secondary | ICD-10-CM

## 2023-11-04 ENCOUNTER — Ambulatory Visit: Payer: MEDICAID | Admitting: Psychology

## 2023-11-04 DIAGNOSIS — Z09 Encounter for follow-up examination after completed treatment for conditions other than malignant neoplasm: Secondary | ICD-10-CM

## 2023-11-05 NOTE — Progress Notes (Signed)
DAP note Client name: Joshua Cooper  Therapist name: [name], [name] Date: 08/29/2023  DATA: Client reports, " I am doing a little bit better this week compared to last. The old meds do help and so that is helping. I know that I need to get back and have them evaluated so we need to look into that. At least for now I am having a bit more normalcy with the sleeping patterns and so I feel a bit safer. Not to mention I feel a bit clearer in my head. Outside of that I am doing okay. I am still talking to my daughter a lot which is a nice change of pace."   ASSESSMENT: Client responds well to treatment. It is a safe place that he can come in and let out his emotions. He can share anything that he wants to share and it is apparent that he feels comfortable doing so. Our team will be looking into where the client can go to have his mental health medication evaluated. It is still of utmost importance that his medications gets figured out as soon as possible. It is nice to hear the client discuss his daughter. This could not have come at a better time for the client, as it is apparent that they both need each other.    PLAN: Continued weekly therapy sessions with a goal of 4 times per month, shifting as needed. Client will practice mindfulness and box breathing when needed to help calm the body, mind, and spirit. Continue writing all thoughts and ideas down to establish patterns and triggers during emotional deregulation. In one month we will begin looking to establish patterns that are in association with the emotions and triggers. In these emotional moments client states that he feels he is at a 6 this week, down from an 8. He would like to continue working to bring this number down to a 2 with the use of interventions.

## 2023-11-06 ENCOUNTER — Other Ambulatory Visit: Payer: MEDICAID | Admitting: Psychology

## 2023-11-06 NOTE — Progress Notes (Signed)
DAP note Client name: Abimelec Swingle  Therapist name: [name], [name] Date: 09/05/2023  DATA: Client reports, " I am continuing to do better it is just slow. It is really just taking time and it is not perfect. I know the priority needs to be that I go and get my meds managed so that I can be feeling even better. I am having a heard time following through with things. Like I bought the clippers a month ago but have yet to cut my hair. Just like going to this appointment, I know that the new medication will make me feel better but I am having the hardest time getting there."   ASSESSMENT: It was apparent that the client was doing okay today. He is just trying to find his way out of the slump that he was in and he is still struggling a bit. We talked about this being very normal until he has some stability.  We did continue to talk about the importance of the medication and it just helping him remain at baseline. We talked about small goal setting for the week, cutting his hair and then the following week going to try and get his meds management. He was going to attempt both. We did place a referral to the [facility] team and so we are waiting to hear back about that.   PLAN: Continued weekly therapy sessions with a goal of 4 times per month, shifting as needed. Client will practice mindfulness and box breathing when needed to help calm the body, mind, and spirit. Continue writing all thoughts and ideas down to establish patterns and triggers during emotional deregulation. In one month we will begin looking to establish patterns that are in association with the emotions and triggers. In these emotional moments client states that he feels he is at a 5 this week, down from an 6, baby steps. He would like to continue working to bring this number down to a 2 with the use of interventions.

## 2023-11-07 ENCOUNTER — Ambulatory Visit: Payer: MEDICAID | Admitting: Psychology

## 2023-11-07 DIAGNOSIS — F4310 Post-traumatic stress disorder, unspecified: Secondary | ICD-10-CM

## 2023-11-07 DIAGNOSIS — F411 Generalized anxiety disorder: Secondary | ICD-10-CM

## 2023-11-07 DIAGNOSIS — F3181 Bipolar II disorder: Secondary | ICD-10-CM

## 2023-11-10 ENCOUNTER — Ambulatory Visit: Payer: MEDICAID | Admitting: Psychology

## 2023-11-10 DIAGNOSIS — Z09 Encounter for follow-up examination after completed treatment for conditions other than malignant neoplasm: Secondary | ICD-10-CM

## 2023-11-10 NOTE — Progress Notes (Signed)
DAP note Client name: Joshua Cooper  Therapist name: [name], [name] Date: 09/12/2023  DATA: Client reports, "This week has been about the same as last. I am doing a bit better. I am concerned because my old meds will run out in about a week so I know that I have got to get over to [facility] as soon as possible and get evaluated for my new meds. It just causes me major anxiety because I do not know if they are going to listen or really take all my diagnosis into account. I just hope that it is not like last time when I went into [facility]. That just really put a bad taste in my mouth. My goal is to go on Monday. I know that I just have to do it. On the plus side: I did cut my hair."  ASSESSMENT: It was apparent that the client was still about the same. He is still just trying to find his way out of the slump that he was in. We did continue to talk about the importance of the medication and it just helping him remain at baseline. He set a goal for going on Monday. Cutting his hair did help the client to feel better and it was a step in the right direction with following his goals. Still waiting to hear back from the ACT team.    PLAN: Continued weekly therapy sessions with a goal of 4 times per month, shifting as needed. Client will practice mindfulness and box breathing when needed to help calm the body, mind, and spirit. Continue writing all thoughts and ideas down to establish patterns and triggers during emotional deregulation. It has been about a month so next week we will begin looking to establish patterns that are in association with the emotions and triggers. In these emotional moments client states that he feels he is at a 5 this week, which remained the same. He would like to continue working to bring this number down to a 2 with the continued use of interventions.

## 2023-11-11 NOTE — Progress Notes (Signed)
DAP note Client name: Joshua Cooper  Therapist name: [name], [name] Date: 09/19/2023  DATA: Client reports, "This week has been about the same as last. I am still doing okay but I did run out of meds. It is only a matter of time before I start to feel awful again. I could not make myself go to [facility] and I know I have to go this week, I must go!" Client states, that he feels he is at a 6 this week. This went up due to the added worry about his meds. He would like to continue working to bring this number down to a 2 with the continued use of interventions.   ASSESSMENT: It was apparent that the client was still about the same. He is more worried now about his meds running out. We did continue to talk about the importance of the medication and it just helping him remain at baseline. He knows the importance but this is new and it causes major anxiety to do new things, especially when it comes to his mental health.   PLAN: Continued weekly therapy sessions with a goal of 4 times per month, shifting as needed. Client will practice mindfulness and box breathing when needed to help calm the body, mind, and spirit. Continue writing all thoughts and ideas down to establish patterns and triggers during emotional deregulation. Client will attempt to go get evaluated for meds on Monday of next week.

## 2023-11-14 ENCOUNTER — Ambulatory Visit: Payer: MEDICAID | Admitting: Psychology

## 2023-11-14 DIAGNOSIS — F4310 Post-traumatic stress disorder, unspecified: Secondary | ICD-10-CM

## 2023-11-14 DIAGNOSIS — F411 Generalized anxiety disorder: Secondary | ICD-10-CM

## 2023-11-14 DIAGNOSIS — F3181 Bipolar II disorder: Secondary | ICD-10-CM

## 2023-11-18 NOTE — Progress Notes (Signed)
DAP note Client name: Joshua Cooper  Therapist name: [name], [name] Date: 10/03/2023 Time: 60 mins.  DATA: Client reports, "I am sorry that I missed an appointment last week. I did not go and do anything about my medication and you were right it is really starting to have an impact on my mental health." Client discussed how he has not been sleeping good and how he is working as much as he can, despite the fact that he makes hardly no money. Client reports his depression is at an 8 and would like to get it back down to a 2 with the use of interventions.   ASSESSMENT: It was apparent that the client was not in the best place. His medications have run out and it is taking a toll on his mental health. He is having a hard time knowing that he has to work because he has no choice, regardless of rather he makes any money or not. Adding to this to the client still being homeless is continuing to take a major toll on the client and would be a lot for anyone to bear. These things are weighing heavily on the client this week.   PLAN: Continued weekly therapy sessions with a goal of 4 times per month, shifting as needed. Client will practice mindfulness and box breathing when needed with a goal of calming his body, mind, and spirit. Continue writing all thoughts and ideas down to establish patterns and triggers during emotional deregulation.. Client will try again to go get evaluated for meds on Monday of next week.

## 2023-11-21 ENCOUNTER — Ambulatory Visit: Payer: MEDICAID | Admitting: Psychology

## 2023-11-21 DIAGNOSIS — F4310 Post-traumatic stress disorder, unspecified: Secondary | ICD-10-CM

## 2023-11-21 DIAGNOSIS — F411 Generalized anxiety disorder: Secondary | ICD-10-CM

## 2023-11-21 DIAGNOSIS — F3181 Bipolar II disorder: Secondary | ICD-10-CM

## 2023-12-05 ENCOUNTER — Ambulatory Visit: Payer: Medicaid Other | Admitting: Psychology

## 2023-12-05 DIAGNOSIS — F411 Generalized anxiety disorder: Secondary | ICD-10-CM

## 2023-12-05 DIAGNOSIS — F3181 Bipolar II disorder: Secondary | ICD-10-CM

## 2023-12-05 DIAGNOSIS — F4310 Post-traumatic stress disorder, unspecified: Secondary | ICD-10-CM
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
## 2023-12-08 NOTE — Progress Notes (Signed)
DAP note Client name: Joshua Cooper  Therapist name: [name], [name] Date: 11/07/2023 Time: 60 mins.  DATA: Client reports, "I really feel hopeful now that ACT is involved. I do not want to get to excited because you know that it seems that something always goes wrong but it really does seem like to could be good." Client also reports the new medication seemed to make him really tired but now that the tired part has worn off he is feeling better and he is excited about that as well. Client reports his depression is at an 8 and would like to get it back down to a 2 with the use of interventions.   ASSESSMENT: The client was upbeat this week. He was hopeful about his future with with the ACT team. This was the first time in a long time that things were seeming like they were going in a good direction. He felt like he had been listened to when it came to his medication evaluation and even though it was not perfect they were already problem solving on what to do with the next round of medication. Once the insurance gets fixed then we can begin to work together to try and secure housing for the client.    PLAN: Continued weekly therapy sessions with a goal of 4 times per month. Client will practice mindfulness and box breathing when needed with a goal of calming his body, mind, and spirit. Continue writing all thoughts and ideas down to establish patterns and triggers during emotional deregulation. Client and therapist will begin identifying the patterns that are apparent from the notes the client has been writing.

## 2023-12-08 NOTE — Progress Notes (Signed)
Case Management Spent an hour filling out the correct documentation for a mental health tailored plan through his Medicaid. Submitted that document for review. The client must have a tailored plan to be part of the ACT team. Let Abby with ACT know what was going on and she will confirm if he gets approved.
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
## 2023-12-10 NOTE — Progress Notes (Signed)
    DAP note Client name: Joshua Cooper  Therapist name: [name], [name] Date: 11/14/2023 Time: 60 mins.  DATA: Client reports, "It took me a little while to get used to the medicine but now that I have I would say that I am feeling better then I have in a really long time. They are going to change my does to 100 less mg for the next shot and they think that should help me feel not as groggy." Client stated he was also starting the medicine for anxiety and reported that he was hopeful for the combo of the two meds.  Client reports his depression is at an 5 this week and would like to bring that to a 2 with the continued use of interventions.  ASSESSMENT: The client had his tailored plan approved and ACT officially picked him up as a client. He is starting to work with them for case management and they are supposed to let us know what they need so we can work together. It is a noticeable difference in how the client is feeling from one week to the next. The medicine appears to be helping, as evidence by his number being lower this week and also his hopefulness, and positive attitude towards the future.    PLAN: Continued weekly therapy sessions with a goal of 4 times per month. Client will practice mindfulness and box breathing when needed with a goal of calming his body, mind, and spirit. Continue writing all thoughts and ideas down to establish patterns and triggers during emotional deregulation. Client and therapist will begin identifying these patterns, as well as possible triggers.

## 2023-12-11 NOTE — Progress Notes (Signed)
Treatment Plan The Counseling Treatment Plan is a dynamic document that is regularly reviewed and updated as the client's needs and circumstances change over the course of counseling. The plan serves as a roadmap for the counseling process and helps ensure that the client and counselor work together toward common goals.   Section I. Client Information  Client name:  Joshua Cooper  Established Date: 5/24 Therapist name: [name] Trogdon Collier [place] Section II. Presenting Concerns   Brief Description of Concerns Client is still experiencing homelessness. Client has a hard time accessing his basic needs, a safe place to sleep, food, and medication. This plays a huge role in his mental health causing the hypomania of bi-polar 2, as well as concurrent depressive episodes. Symptoms of GAD and PTSD are also present and clearly observed.   Goals for Counseling Weekly therapy sessions. Working on past trauma. Decreasing anxiety and depression levels. Finding emotional regulation. Making positive and healthy changes in your behavior or routine.      Section III. Action Plan          Summary of Strategies/ Interventions       Roles and Responsibilities       Timeline for Completion  1) Attend at least three sessions per month. Hoping that the client will attend each week because it is very valuable for his mental health but setting a realistic goal that allows for emergencies, transportation, or sickness.   Client- make sure bus pass stays current, sets good times per week that work well for schedule, reduce as many barriers as possible Me- make myself as available as I can for weekly sessions.  Evaluate at end of month to see if goal is being met.   2) Working on past trauma. Accept the reality of the traumatic event. Feel emotions about the traumatic event and reduce avoidance. Develop balanced and realistic beliefs about the event, oneself, others, and the world. Together- begin taking one event at  a time and talking, Accept, feel, and develop balance with time Evaluate in 2 weeks, dependent  upon event discussed.  3) Decrease anxiety levels from 8 to 3. Practice mindfulness techniques daily learned in therapy. If only for 5 mins a day. Client- committed to 5 mins per day. Me- teach mindfulness or box breathing techniques and recenter each session. One month  4) Addressing root causes of stress and emotional deregulation Client- spend 10 mins. a day journaling times that cause emotional distress to establish patterns, behaviors, or triggers of emotional deregulation. One month of consistent  journaling.   5) Making positive changes in behavior and routine. Client will get the best sleep that he can, attend food pantry weekly to ensure client can eat as healthy as possible, meet with ACT regularly for medication management. Client- commit to routine that client sets at least 5 times weekly. Each month.         Additional notes: Reviewed: current date 11/10/2023 Ongoing, Achieved, Revised

## 2023-12-12 ENCOUNTER — Ambulatory Visit: Payer: MEDICAID | Admitting: Psychology

## 2023-12-12 DIAGNOSIS — F411 Generalized anxiety disorder: Secondary | ICD-10-CM

## 2023-12-12 DIAGNOSIS — F3181 Bipolar II disorder: Secondary | ICD-10-CM

## 2023-12-12 DIAGNOSIS — F4310 Post-traumatic stress disorder, unspecified: Secondary | ICD-10-CM

## 2023-12-19 ENCOUNTER — Other Ambulatory Visit: Payer: MEDICAID | Admitting: Psychology

## 2024-01-02 ENCOUNTER — Ambulatory Visit (INDEPENDENT_AMBULATORY_CARE_PROVIDER_SITE_OTHER): Payer: Self-pay | Admitting: Psychology

## 2024-01-02 DIAGNOSIS — F411 Generalized anxiety disorder: Secondary | ICD-10-CM

## 2024-01-02 DIAGNOSIS — F4310 Post-traumatic stress disorder, unspecified: Secondary | ICD-10-CM

## 2024-01-02 DIAGNOSIS — F3181 Bipolar II disorder: Secondary | ICD-10-CM

## 2024-01-09 ENCOUNTER — Ambulatory Visit (INDEPENDENT_AMBULATORY_CARE_PROVIDER_SITE_OTHER): Payer: Self-pay | Admitting: Psychology

## 2024-01-09 DIAGNOSIS — F4310 Post-traumatic stress disorder, unspecified: Secondary | ICD-10-CM

## 2024-01-09 DIAGNOSIS — F3181 Bipolar II disorder: Secondary | ICD-10-CM

## 2024-01-09 DIAGNOSIS — F411 Generalized anxiety disorder: Secondary | ICD-10-CM

## 2024-01-16 ENCOUNTER — Ambulatory Visit (INDEPENDENT_AMBULATORY_CARE_PROVIDER_SITE_OTHER): Payer: Self-pay | Admitting: Psychology

## 2024-01-16 DIAGNOSIS — F411 Generalized anxiety disorder: Secondary | ICD-10-CM

## 2024-01-16 DIAGNOSIS — F4310 Post-traumatic stress disorder, unspecified: Secondary | ICD-10-CM

## 2024-01-16 DIAGNOSIS — F3181 Bipolar II disorder: Secondary | ICD-10-CM
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
## 2024-01-19 NOTE — Progress Notes (Addendum)
    DAP note Client name: Joshua Cooper  Therapist name: [name], [name] Date: 12/05/2023 Time: 60 mins.  DATA: Client reports, "I am doing pretty good. It is nice to have a safe place to stay. My roommate and I get along pretty good. Now that my medicines are closer to being regulated, I really plan to use this time to get back on my feet. I really need some normalcy in my life." Client also stated that they ACT team has housing plans and that they are really helping him in many ways. He reported that he feels very supported at this time. Client reports his anxiety and depression are down to a level 3 this week and would like to work to continue to bring it down to a 2 with the continued use of interventions.   ASSESSMENT: The client has [facility] into [facility] program for winter housing. He moved in right before the cold weather came in. He will be there until April. He is motivated to use this time to continue getting back on his feet. He has a lot of support and it is apparent by his actions that he feels this way for the first time in a long time.    PLAN: Continued weekly therapy sessions with a goal of 4 times per month. Client will practice mindfulness and box breathing when needed with a goal of calming his body, mind, and spirit. Client will make a daily routine and do his best to try and stick to it now that he has temporary shelter.

## 2024-01-23 ENCOUNTER — Ambulatory Visit: Payer: Self-pay | Admitting: Psychology

## 2024-01-23 DIAGNOSIS — F411 Generalized anxiety disorder: Secondary | ICD-10-CM

## 2024-01-23 DIAGNOSIS — F3181 Bipolar II disorder: Secondary | ICD-10-CM

## 2024-01-23 DIAGNOSIS — F4310 Post-traumatic stress disorder, unspecified: Secondary | ICD-10-CM

## 2024-01-30 ENCOUNTER — Ambulatory Visit (INDEPENDENT_AMBULATORY_CARE_PROVIDER_SITE_OTHER): Payer: Self-pay | Admitting: Psychology

## 2024-01-30 DIAGNOSIS — F3181 Bipolar II disorder: Secondary | ICD-10-CM

## 2024-01-30 DIAGNOSIS — F411 Generalized anxiety disorder: Secondary | ICD-10-CM

## 2024-01-30 DIAGNOSIS — F4310 Post-traumatic stress disorder, unspecified: Secondary | ICD-10-CM

## 2024-02-06 ENCOUNTER — Ambulatory Visit: Payer: MEDICAID | Admitting: Psychology

## 2024-02-06 DIAGNOSIS — F3181 Bipolar II disorder: Secondary | ICD-10-CM

## 2024-02-06 DIAGNOSIS — F4310 Post-traumatic stress disorder, unspecified: Secondary | ICD-10-CM

## 2024-02-06 DIAGNOSIS — F411 Generalized anxiety disorder: Secondary | ICD-10-CM

## 2024-02-10 NOTE — Progress Notes (Signed)
    DAP note Client name: Joshua Cooper  Therapist name: [name], [name] Date: 01/02/2024 Time: 60 mins.  DATA: Client reports, "I am doing pretty good. I could not keep taking that medication for PTSD it was making me so sleepy and just out of it. Now that I am not on that I am feeling better and most importantly the nightmares seem to have stopped for now." Client also states that this lawsuit is still hanging over his head. He is working on getting a Clinical research associate in place so that he can move forward with serving the papers. Client reports his anxiety and depression are still at a  level 5 this week and would like to work to continue to bring it down to a 2 with the continued use of interventions.   ASSESSMENT: The client appears to be in a good place. It seems that they have gotten his medicine regiment figured out and he seems to be sticking with it. He is also on a schedule the best that he can and this is helping him to get good solid sleep at night and function better during the day. He is setting goals and for the most part he is following through in a timely manner. He is maintaining his relationship with his daughter and this is bringing him a lot of joy.    PLAN: Continued weekly therapy sessions with a goal of 4 times per month. Client will practice mindfulness and box breathing when needed with a goal of calming his body, mind, and spirit. Client will continue with daily routine at least 5 days per week.

## 2024-02-13 ENCOUNTER — Ambulatory Visit (INDEPENDENT_AMBULATORY_CARE_PROVIDER_SITE_OTHER): Payer: MEDICAID | Admitting: Psychology

## 2024-02-13 DIAGNOSIS — F4310 Post-traumatic stress disorder, unspecified: Secondary | ICD-10-CM

## 2024-02-13 DIAGNOSIS — F411 Generalized anxiety disorder: Secondary | ICD-10-CM

## 2024-02-13 DIAGNOSIS — F3181 Bipolar II disorder: Secondary | ICD-10-CM

## 2024-02-20 ENCOUNTER — Other Ambulatory Visit: Payer: MEDICAID | Admitting: Psychology

## 2024-02-27 ENCOUNTER — Ambulatory Visit: Payer: MEDICAID | Admitting: Psychology

## 2024-02-27 DIAGNOSIS — F4310 Post-traumatic stress disorder, unspecified: Secondary | ICD-10-CM

## 2024-02-27 DIAGNOSIS — F3181 Bipolar II disorder: Secondary | ICD-10-CM

## 2024-02-27 DIAGNOSIS — F411 Generalized anxiety disorder: Secondary | ICD-10-CM

## 2024-03-05 ENCOUNTER — Ambulatory Visit (INDEPENDENT_AMBULATORY_CARE_PROVIDER_SITE_OTHER): Payer: MEDICAID | Admitting: Psychology

## 2024-03-05 DIAGNOSIS — F411 Generalized anxiety disorder: Secondary | ICD-10-CM

## 2024-03-05 DIAGNOSIS — F4310 Post-traumatic stress disorder, unspecified: Secondary | ICD-10-CM

## 2024-03-05 DIAGNOSIS — F3181 Bipolar II disorder: Secondary | ICD-10-CM
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
## 2024-03-11 NOTE — Progress Notes (Signed)
 DAP note Client name: Joshua Cooper  Therapist name: [name], [name] Date: 01/16/2024 Time: 60 mins.  DATA: Client reports, "I am doing okay. I am feeling like my medicines are finally working correctly. The shot really helps me focus and feel good mentally. I finally feel like we have them right. The only thing that I might want to consider would be to go up on the dosage with the shot now that I have gotten used to it." The client also stated that he will be looking for a lawyer this week to help with this lawsuit. He reported that he would just feel more confident with a little advice so that he knows how to proceed. Client reported that he did apply at [facility] last week and will plan to go to [place] this week. Client reports his anxiety and depression are at a level 5 this week and would like to work to continue to bring it down to a 2 with the continued use of interventions.   ASSESSMENT: The client is doing well. His mental health does seem fairly stable overall and that has been consistent for the past few weeks, as evidence by him being able to continue to check things off of his list, like applying for jobs. His stress is coming from this lawsuit and the continued worry that he has about housing. Housing is something that is always in the back of his mind. For the first time in a long time, he has some stability. He does not want to lose that and in fact he would like to take it a step further to have some of these things become permanent.    PLAN: Continued weekly therapy sessions with a goal of 4 times per month. Client will practice mindfulness and box breathing when needed with a goal of calming his body, mind, and spirit. Client will continue with daily routine at least 5 days per week. Client will begin applying for jobs with a goal of 2 jobs per week.

## 2024-03-12 ENCOUNTER — Other Ambulatory Visit: Payer: Self-pay | Admitting: Psychology

## 2024-03-12 ENCOUNTER — Ambulatory Visit (INDEPENDENT_AMBULATORY_CARE_PROVIDER_SITE_OTHER): Payer: Self-pay | Admitting: Psychology

## 2024-03-12 DIAGNOSIS — F411 Generalized anxiety disorder: Secondary | ICD-10-CM

## 2024-03-12 DIAGNOSIS — F3181 Bipolar II disorder: Secondary | ICD-10-CM

## 2024-03-12 DIAGNOSIS — F4310 Post-traumatic stress disorder, unspecified: Secondary | ICD-10-CM

## 2024-03-18 NOTE — Progress Notes (Signed)
 DAP note Client name: Joshua Cooper  Therapist name: [name], [name] Date: 01/23/2024 Time: 60 mins.  DATA: Client reports, "I have been doing okay. I feel like sometimes things are going good and then other times not. I do not feel as bad as I have in the past, it is more like dips in my mood. It is usually when I am thinking about specific things like my ex-wife or the challenges we have had when it comes to housing."  Client reports that the consistency of the the pallets has been good and that it does not take him near as long as it use to when coming out of the lower dips. Client reports his anxiety and depression are at a level 5 this week and would like to work to continue to bring it down to a 2 with the continued use of interventions.   ASSESSMENT: The client is doing pretty good overall. His mental health remains stable. It is good that he can evaluate what he is feeling. In the past he would shut down for days and it would take him really long to recover from the dip in his mood. It has really helped that he is able to sleep safely and the ACT team has been consistent with his medications. We just need to continue to work towards stable housing.   PLAN: Continued weekly therapy sessions with a goal of 4 times per month. Client will practice mindfulness and box breathing when needed with a goal of calming his body, mind, and spirit. Client will continue with daily routine at least 5 days per week. Client will begin applying for jobs with a goal of 2 jobs per week.

## 2024-03-19 ENCOUNTER — Other Ambulatory Visit: Payer: Self-pay | Admitting: Psychology
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
## 2024-03-23 NOTE — Progress Notes (Addendum)
 DAP note Client name: Joshua Cooper  Therapist name: Joshua Cooper Joshua Cooper, Joshua Cooper Date: 01/30/2024 Time: 60 mins.  DATA: Client reports,"Things are a little better this week. [name] was not hiring so I am ready to look for somewhere else to apply. I am feeling a bit more confident and just trying to wrap my head around the next steps for housing. I am really hopeful something will come about. Client reports his anxiety and depression are at a level 5 this week and would like to work to continue to bring it down to a 2 with the continued use of interventions.   ASSESSMENT: The client is doing pretty good overall. His mental health remains stable. Hopefully he can focus more on his goals with the stability of the pallets and his medication. It is so important that he keeps the hope surrounding housing so that we can continue to break down the barriers that have been in place for so long.    PLAN: Continued weekly therapy sessions with a goal of consistently attending 4 times per month. Client will practice mindfulness daily and box breathing when needed with a goal of calming his body, mind, and spirit. Client will continue with daily routine honoring it at least 5 days per week. Client will apply for 2 jobs per week.

## 2024-03-26 ENCOUNTER — Ambulatory Visit (INDEPENDENT_AMBULATORY_CARE_PROVIDER_SITE_OTHER): Payer: Self-pay | Admitting: Psychology

## 2024-03-26 DIAGNOSIS — F4310 Post-traumatic stress disorder, unspecified: Secondary | ICD-10-CM

## 2024-03-26 DIAGNOSIS — F411 Generalized anxiety disorder: Secondary | ICD-10-CM

## 2024-03-26 DIAGNOSIS — F3181 Bipolar II disorder: Secondary | ICD-10-CM

## 2024-04-02 ENCOUNTER — Ambulatory Visit (INDEPENDENT_AMBULATORY_CARE_PROVIDER_SITE_OTHER): Payer: Self-pay | Admitting: Psychology

## 2024-04-02 DIAGNOSIS — F3181 Bipolar II disorder: Secondary | ICD-10-CM

## 2024-04-02 DIAGNOSIS — F4310 Post-traumatic stress disorder, unspecified: Secondary | ICD-10-CM

## 2024-04-02 DIAGNOSIS — F411 Generalized anxiety disorder: Secondary | ICD-10-CM

## 2024-04-06 NOTE — Progress Notes (Signed)
 DAP note Client name: Joshua Cooper  Therapist name: Joshua Cooper, Joshua Cooper Date: 02/13/2024 Time: 60 mins.  DATA: Client reports,"I am doing pretty good. I am still looking for work. I applied at 2 places this week so I will check back in next week. I am a bit worried about [employer] coming to an end but hopefully things will work out. That is why getting a job is so important." Client reports his anxiety and depression are at a level 3 this week and would like to work to continue to bring it down to a 2 with the continued use of interventions.   ASSESSMENT: The client is doing pretty good overall. His mental health remains stable. Getting a job is the most important thing at the moment. If he has income then we can circle back to [facility] for housing. Client is really focused on his goals and continues to meet what is set.    PLAN: Continued weekly therapy sessions with a goal of consistently attending 4 times per month. Client will practice mindfulness daily and box breathing when needed with a goal of calming his body, mind, and spirit. Client will continue with daily routine honoring it at least 5 days per week. Client will apply for 2 jobs per week.

## 2024-04-09 ENCOUNTER — Other Ambulatory Visit: Payer: Self-pay | Admitting: Psychology

## 2024-04-19 NOTE — Progress Notes (Signed)
 DAP note Client name: Joshua Cooper  Therapist name: Eligah Grow Starling Eck, Milinda Allen Date: 02/26/2024 Time: 60 mins.  DATA: Client reports,"I am doing pretty good. I am still looking for work. I am suppose to have an interview at [employer] so we will see how that goes." Client stated that he was still really worried about the program ending and not having a roof over his head. Client reports his anxiety and depression are at a level 3 this week and would like to work to continue to bring it down to a 2 with the continued use of interventions.   ASSESSMENT: The client is doing pretty good overall. His mental health remains stable. Getting a job remains of the highest importance. If he has income then we can circle back to [facility] for housing, that is really the only plan so it is very important. Client is really focused on his goals and continues to meet what is set.    PLAN: Continued weekly therapy sessions with a goal of consistently attending 4 times per month. Client will practice mindfulness daily and box breathing when needed with a goal of calming his body, mind, and spirit. Client will continue with daily routine honoring it at least 5 days per week. Client will apply for 2 jobs per week.

## 2024-04-23 ENCOUNTER — Other Ambulatory Visit: Payer: Self-pay | Admitting: Psychology

## 2024-04-30 ENCOUNTER — Ambulatory Visit: Payer: Self-pay | Admitting: Psychology

## 2024-04-30 DIAGNOSIS — F4310 Post-traumatic stress disorder, unspecified: Secondary | ICD-10-CM

## 2024-04-30 DIAGNOSIS — F3181 Bipolar II disorder: Secondary | ICD-10-CM

## 2024-04-30 DIAGNOSIS — F411 Generalized anxiety disorder: Secondary | ICD-10-CM

## 2024-05-03 NOTE — Progress Notes (Signed)
 DAP note Client name: Joshua Cooper  Therapist name: Eligah Grow Starling Eck, Milinda Allen Date: 03/05/2024 Time: 60 mins.  DATA: Client reports,"Things are pretty good. I am nervous about going back to the [facility]. I know that it is temporary, it just causes such stress. I will just be glad when we are all moved." The client also reported that he went for his interview and the manager was not there so he states that he will try again in a few days. Client reports his anxiety and depression are at a level 3 this week and would like to work to continue to bring it down to a 2 with the continued use of interventions.   ASSESSMENT: The client is doing pretty good overall. His mental health remains stable. The transition between locations is causing some overall nerves. Hopefully two weeks will go quickly. We talked about preparing for a possible dip in his mental health and what he can do to prevent it or stay on top of it.    PLAN: Continued weekly therapy sessions with a goal of consistently attending 4 times per month. Client will practice mindfulness daily and box breathing when needed with a goal of calming his body, mind, and spirit. Client will continue with daily routine honoring it at least 5 days per week. Client will apply for 2 jobs per week.

## 2024-05-07 ENCOUNTER — Ambulatory Visit (INDEPENDENT_AMBULATORY_CARE_PROVIDER_SITE_OTHER): Payer: Self-pay | Admitting: Psychology

## 2024-05-07 DIAGNOSIS — F3181 Bipolar II disorder: Secondary | ICD-10-CM

## 2024-05-07 DIAGNOSIS — F411 Generalized anxiety disorder: Secondary | ICD-10-CM

## 2024-05-07 DIAGNOSIS — F4310 Post-traumatic stress disorder, unspecified: Secondary | ICD-10-CM

## 2024-05-14 ENCOUNTER — Other Ambulatory Visit: Payer: Self-pay | Admitting: Psychology

## 2024-05-17 NOTE — Progress Notes (Signed)
 DAP note Client name: Torence Belzer  Therapist name: Eligah Grow Starling Eck, Milinda Allen Date: 03/12/2024 Time: 60 mins.  DATA: Client reports,"I am glad that I had therapy today so that I could get away from the [facility]. I just hope that they are right when they say that this will only take 2 weeks. I am just trying to keep my head down and get through it." Client did state that staying there is causing some unwanted anxiety. Client reports his anxiety and depression are at a level 6 this week and would like to work to get it down to a 2 with the continued use of interventions.   ASSESSMENT: The client is visibly doing pretty well. Considering that the circumstances are not ideal for the client. The [facility] causes such stress. We really focused on his goals today thinking about more places to apply once they are settled and keeping TCL at the front of everything that he has going on so that he stays focused, despite the hardship.   PLAN: Continued weekly therapy sessions with a goal of consistently attending 4 times per month. Client will practice mindfulness daily and box breathing when needed with a goal of calming his body, mind, and spirit. Client will continue with daily routine honoring it at least 5 days per week. Client will apply for 2 jobs per week.

## 2024-05-17 NOTE — Progress Notes (Signed)
 DAP note Client name: Kert Mccurley  Therapist name: Eligah Grow Starling Eck, Milinda Allen Date: 03/26/2024 Time: 60 mins.  DATA: Client reports," I am doing okay. I am just trying to keep myself busy and surround myself with people that are trying to do better." Client reported that he is excited that the timeline was accurate and that they would be moving to the new location soon. Client reports his anxiety and depression are at a level 5 this week and would like to work to get it down to a 2 with the continued use of interventions.   ASSESSMENT: The client is doing okay. This transition has been hard, especially since his roommate has been having a really hard time. His mental health has continued to decrease. Hopefully the move will help everyone. Client is bust applying for jobs and trying to keep his goals at the forefront.   PLAN: Continued weekly therapy sessions with a goal of consistently attending 4 times per month. Client will practice mindfulness daily and box breathing when needed with a goal of calming his body, mind, and spirit. Client will continue with daily routine honoring it at least 5 days per week. Client will apply for 2 jobs per week.

## 2024-05-21 ENCOUNTER — Ambulatory Visit: Payer: Self-pay | Admitting: Psychology

## 2024-05-21 DIAGNOSIS — F3181 Bipolar II disorder: Secondary | ICD-10-CM

## 2024-05-21 DIAGNOSIS — F4310 Post-traumatic stress disorder, unspecified: Secondary | ICD-10-CM

## 2024-05-21 DIAGNOSIS — F411 Generalized anxiety disorder: Secondary | ICD-10-CM

## 2024-05-25 NOTE — Progress Notes (Signed)
 DAP note Client name: Joshua Cooper  Therapist name: Eligah Grow Starling Eck, Milinda Allen Date: 04/02/2024 Time: 60 mins.  DATA: Client reports," I have been struggling. I think that it is the thought of this trial. I am not sure that it was a good idea after all." He reported that it is causing massive amounts of anxiety. Client reports his anxiety and depression are at a level 8 this week and would like to work to get it down to a 2 with the continued use of interventions.   ASSESSMENT: The client is not doing so well. We had talked about this as a possibility that might occur. He had been doing so well maintaining his mental health, however this was such a traumatic time in his life and it is bringing all of those feelings back up. The challenge today was to think if following through with this is worth it to his overall health.    PLAN: Continued weekly therapy sessions with a goal of consistently attending 4 times per month. Client will practice mindfulness daily and box breathing when needed with a goal of calming his body, mind, and spirit. Client will continue with daily routine honoring it at least 5 days per week. Client will apply for 2 jobs per week.

## 2024-05-28 ENCOUNTER — Ambulatory Visit (INDEPENDENT_AMBULATORY_CARE_PROVIDER_SITE_OTHER): Payer: Self-pay | Admitting: Psychology

## 2024-05-28 DIAGNOSIS — F4310 Post-traumatic stress disorder, unspecified: Secondary | ICD-10-CM

## 2024-05-28 DIAGNOSIS — F411 Generalized anxiety disorder: Secondary | ICD-10-CM

## 2024-05-28 DIAGNOSIS — F3181 Bipolar II disorder: Secondary | ICD-10-CM

## 2024-06-09 ENCOUNTER — Other Ambulatory Visit: Payer: Self-pay | Admitting: Psychology

## 2024-06-18 ENCOUNTER — Other Ambulatory Visit: Payer: Self-pay | Admitting: Psychology

## 2024-06-25 ENCOUNTER — Other Ambulatory Visit: Payer: Self-pay | Admitting: Psychology

## 2024-06-30 ENCOUNTER — Other Ambulatory Visit: Payer: Self-pay | Admitting: Psychology
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
## 2024-07-09 ENCOUNTER — Ambulatory Visit (INDEPENDENT_AMBULATORY_CARE_PROVIDER_SITE_OTHER): Payer: Self-pay | Admitting: Psychology

## 2024-07-09 DIAGNOSIS — F4310 Post-traumatic stress disorder, unspecified: Secondary | ICD-10-CM

## 2024-07-09 DIAGNOSIS — F3181 Bipolar II disorder: Secondary | ICD-10-CM

## 2024-07-09 DIAGNOSIS — F411 Generalized anxiety disorder: Secondary | ICD-10-CM

## 2024-07-16 ENCOUNTER — Other Ambulatory Visit: Payer: Self-pay | Admitting: Psychology

## 2024-07-19 NOTE — Progress Notes (Signed)
 DAP note Client name: Joshua Cooper  Therapist name: Joshua Cooper, Joshua Cooper Date: 05/21/2024 Time: 60 mins.  DATA: Client reports he is he decided not to go through with going to court. He stated that it took a huge toll on him and he did not realize that this was going to happen. He reported that he thought that the fall out would have been worse if he had followed through.  Client reports his anxiety and depression are at a level 3 this week and would like to work to get it down to a 2 with the continued use of interventions.   ASSESSMENT: This was the best choice that the client could have made. This was causing him so much stress and it snuck up on him as he did not realize he was going to feel the way that he did. He feel into a deep depression and he is already moving forward now that it is no longer a choice.    PLAN: Continued weekly therapy sessions with a goal of consistently attending 4 times per month. Client will practice mindfulness daily and box breathing when needed with a goal of calming his body, mind, and spirit. Client will continue with daily routine honoring to the best of his ability given his circumstances 5 days per week. Client will apply for 2 jobs per week

## 2024-07-23 ENCOUNTER — Ambulatory Visit (INDEPENDENT_AMBULATORY_CARE_PROVIDER_SITE_OTHER): Payer: Self-pay | Admitting: Psychology

## 2024-07-23 DIAGNOSIS — F3181 Bipolar II disorder: Secondary | ICD-10-CM

## 2024-07-23 DIAGNOSIS — F411 Generalized anxiety disorder: Secondary | ICD-10-CM

## 2024-07-23 DIAGNOSIS — F4310 Post-traumatic stress disorder, unspecified: Secondary | ICD-10-CM
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
## 2024-07-30 ENCOUNTER — Other Ambulatory Visit: Payer: Self-pay | Admitting: Psychology

## 2024-08-13 ENCOUNTER — Ambulatory Visit (INDEPENDENT_AMBULATORY_CARE_PROVIDER_SITE_OTHER): Payer: Self-pay | Admitting: Psychology

## 2024-08-13 DIAGNOSIS — F411 Generalized anxiety disorder: Secondary | ICD-10-CM

## 2024-08-13 DIAGNOSIS — F4310 Post-traumatic stress disorder, unspecified: Secondary | ICD-10-CM

## 2024-08-13 DIAGNOSIS — F3181 Bipolar II disorder: Secondary | ICD-10-CM

## 2024-08-19 ENCOUNTER — Other Ambulatory Visit: Payer: Self-pay | Admitting: Psychology

## 2024-08-27 ENCOUNTER — Other Ambulatory Visit: Payer: Self-pay | Admitting: Psychology

## 2024-09-01 NOTE — Progress Notes (Signed)
 DAP note Client name: Joshua Cooper  Therapist name: Camie Norris Joshua Cooper, Joshua Cooper Date: 07/09/2024 Time: 60 mins.  DATA: Client reports that he has not been to therapy in a while because he was working. He reports that he was so happy about his job but that they are not paying him. He states that he gave them three weeks but they have yet to produce a pay check or give any answers. He stated that he can not work there anymore. He states that he is just disappointed because he really tried with this job.  Client reports his anxiety and depression are at a level 6 this week and would like to work to get it down to a 2 with the continued use of interventions.   ASSESSMENT: Client presented frustrated. He seems to have really been giving this job everything that he has and yet he has not gotten paid for over 65 hours of work. LCSWA offered to check in and client was willing but at that point he had stopped going to work because he was not getting compensated and he had tried to talk to them several times. Client is very frustrated because he feels that he takes one step forward and then 20 steps back.   PLAN: Continued weekly therapy sessions with a goal of consistently attending 4 times per month. Client will practice mindfulness daily and box breathing when needed with a goal of calming his body, mind, and spirit. Client will continue with daily routine honoring to the best of his ability given his circumstances 5 days per week. Client will apply for 2 jobs per week.

## 2024-09-03 ENCOUNTER — Ambulatory Visit (INDEPENDENT_AMBULATORY_CARE_PROVIDER_SITE_OTHER): Payer: Self-pay | Admitting: Psychology

## 2024-09-03 DIAGNOSIS — F411 Generalized anxiety disorder: Secondary | ICD-10-CM

## 2024-09-03 DIAGNOSIS — F3181 Bipolar II disorder: Secondary | ICD-10-CM

## 2024-09-03 DIAGNOSIS — F4310 Post-traumatic stress disorder, unspecified: Secondary | ICD-10-CM

## 2024-09-10 ENCOUNTER — Other Ambulatory Visit: Payer: Self-pay | Admitting: Psychology

## 2024-09-13 NOTE — Progress Notes (Signed)
 DAP note Client name: Joshua Cooper  Therapist name: Camie Norris Joshua Cooper, Joshua Cooper Date: 07/23/2024 Time: 60 mins.  DATA: Client reports that he lost his job. He states that he was having a really hard mental health day and that he just did not show up. He reports that was not a good choice but he stated that he is also very frustrated because he was not getting paid. Client reports his anxiety and depression are at a level 6 this week and would like to work to get it down to a 2 with the continued use of interventions.   ASSESSMENT: Client presented frustrated and sad. This is evidence as to why it is so hard for the client to hold down a job. At times his mental health is so severe that it debilitates him. Until there is consistency with his daily life, this will continue to be a struggle.    PLAN: Continued weekly therapy sessions with a goal of consistently attending 4 times per month. Client will practice mindfulness daily and box breathing when needed with a goal of calming his body, mind, and spirit. Client will continue with daily routine honoring to the best of his ability given his circumstances 5 days per week. Client will begin to apply for 2 jobs per week.

## 2024-09-17 ENCOUNTER — Ambulatory Visit: Payer: Self-pay | Admitting: Psychology

## 2024-09-17 DIAGNOSIS — F411 Generalized anxiety disorder: Secondary | ICD-10-CM

## 2024-09-17 DIAGNOSIS — F3181 Bipolar II disorder: Secondary | ICD-10-CM

## 2024-09-17 DIAGNOSIS — F4310 Post-traumatic stress disorder, unspecified: Secondary | ICD-10-CM

## 2024-09-21 ENCOUNTER — Ambulatory Visit (INDEPENDENT_AMBULATORY_CARE_PROVIDER_SITE_OTHER): Payer: Self-pay | Admitting: Psychology

## 2024-09-21 DIAGNOSIS — F3181 Bipolar II disorder: Secondary | ICD-10-CM

## 2024-09-21 DIAGNOSIS — F4310 Post-traumatic stress disorder, unspecified: Secondary | ICD-10-CM

## 2024-09-21 DIAGNOSIS — F411 Generalized anxiety disorder: Secondary | ICD-10-CM

## 2024-10-01 ENCOUNTER — Ambulatory Visit (INDEPENDENT_AMBULATORY_CARE_PROVIDER_SITE_OTHER): Payer: Self-pay | Admitting: Psychology

## 2024-10-01 DIAGNOSIS — F4310 Post-traumatic stress disorder, unspecified: Secondary | ICD-10-CM

## 2024-10-01 DIAGNOSIS — F411 Generalized anxiety disorder: Secondary | ICD-10-CM

## 2024-10-01 DIAGNOSIS — F3181 Bipolar II disorder: Secondary | ICD-10-CM

## 2024-10-02 NOTE — Progress Notes (Addendum)
 DAP note Client name: Joshua Cooper  Therapist name: Camie Norris [name], [name] Date: 08/13/2024 Time: 60 mins.  DATA: Client reports that he is doing okay. He states that he is continuing to look for jobs. He reports that he has come to the conclusion that he will not be homeless by the time the end of the year roles around. He states that he is more focused on his goals now more than ever. Client reports his anxiety and depression are at a level 4 this week and would like to work to get it down to a 2 with the continued use of interventions.   ASSESSMENT: Client presented confident and goal driven. He is tired of the instability and he is ready to do something about it. For the first time in a long time his mental health is stable and that is helping him to be able to make and tackle goals.    PLAN: Continued weekly therapy sessions with a goal of consistently attending 4 times per month. Client will practice mindfulness daily and box breathing when needed with a goal of calming his body, mind, and spirit. Client will continue with daily routine honoring to the best of his ability given his circumstances 5 days per week. Client will begin to apply for 2 jobs per week.

## 2024-10-08 ENCOUNTER — Other Ambulatory Visit: Payer: Self-pay | Admitting: Psychology

## 2024-10-15 ENCOUNTER — Other Ambulatory Visit: Payer: Self-pay | Admitting: Psychology

## 2024-10-15 ENCOUNTER — Ambulatory Visit (INDEPENDENT_AMBULATORY_CARE_PROVIDER_SITE_OTHER): Payer: Self-pay | Admitting: Psychology

## 2024-10-15 DIAGNOSIS — F3181 Bipolar II disorder: Secondary | ICD-10-CM

## 2024-10-15 DIAGNOSIS — F4310 Post-traumatic stress disorder, unspecified: Secondary | ICD-10-CM

## 2024-10-15 DIAGNOSIS — F411 Generalized anxiety disorder: Secondary | ICD-10-CM

## 2024-10-22 ENCOUNTER — Ambulatory Visit: Payer: Self-pay | Admitting: Psychology

## 2024-10-22 DIAGNOSIS — F3181 Bipolar II disorder: Secondary | ICD-10-CM

## 2024-10-22 DIAGNOSIS — F411 Generalized anxiety disorder: Secondary | ICD-10-CM

## 2024-10-22 DIAGNOSIS — F4310 Post-traumatic stress disorder, unspecified: Secondary | ICD-10-CM
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
## 2024-10-29 ENCOUNTER — Other Ambulatory Visit: Payer: Self-pay | Admitting: Psychology

## 2024-11-12 ENCOUNTER — Other Ambulatory Visit: Payer: Self-pay | Admitting: Psychology

## 2024-11-15 NOTE — Progress Notes (Signed)
 DAP note Client name: Joshua Cooper  Therapist name: Joshua Cooper, Joshua Cooper Date: 09/16/2024 Time: 60 mins.  DATA: Client reports that he is doing pretty good. 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 He states that he would like it to be just a little more steady and then he thinks that he will be ready to pull the plug on moving in to a place. He reports that for the first time this is looking more attainable and therefore, he is starting to get excited. He also states that this makes him more motivated to continue. Client reports his anxiety and depression are at a level 3 this week and would like to work to get it down to a 2 with the continued use of interventions.   ASSESSMENT: Client presented confident and goal driven. He is very proud of himself for working and saving money. It is giving him more confidence, which is also helping his overall health, specific to his mental. He has more ideas to continue to get income coming in and he still has the goal of being housed before Christmas. It appears he is on that track.    PLAN: Continued weekly therapy sessions with a goal of consistently attending 4 times per month. Client will practice mindfulness daily and box breathing when needed with a goal of calming his body, mind, and spirit. Client will continue with daily routine honoring to the best of his ability given his circumstances 5 days per week. Client will continue to apply for jobs.

## 2024-11-19 ENCOUNTER — Ambulatory Visit (INDEPENDENT_AMBULATORY_CARE_PROVIDER_SITE_OTHER): Payer: Self-pay | Admitting: Psychology

## 2024-11-19 DIAGNOSIS — F4310 Post-traumatic stress disorder, unspecified: Secondary | ICD-10-CM

## 2024-11-19 DIAGNOSIS — F3181 Bipolar II disorder: Secondary | ICD-10-CM

## 2024-11-19 DIAGNOSIS — F411 Generalized anxiety disorder: Secondary | ICD-10-CM

## 2024-11-29 NOTE — Progress Notes (Signed)
 DAP note Client name: Pranav Mannor  Therapist name: Camie Norris [name], [name] Date: 10/15/2024 Time: 60 mins.  DATA: Client reports that he is doing wonderful. He reports that he officially moved into his place on the 14th of the month. He states that they spent time cleaning and making it there own. He states that so far things are going good and they are continuing to work out the details. He reports that he is excited but also nervous. He states that he just wants this to be successful.    ASSESSMENT: Client presented as a calm presence. He was alert and oriented. He is still reluctant to be fully excited which is understandable. Mentally he is also a little overwhelmed as three years was a long time to be un-housed. He needs to take the time that he needs to adjust to his new normal.    PLAN: Continued weekly therapy sessions with a goal of consistently attending 4 times per month. Client will practice mindfulness daily and box breathing when needed with a goal of calming his body, mind, and spirit. Client will continue with daily routine honoring to the best of his ability given his circumstances 5 days per week. Client will continue to apply for jobs to be able to continue to save money.

## 2024-11-29 NOTE — Progress Notes (Signed)
 DAP note Client name: Joshua Cooper  Therapist name: Camie Norris [name], Joshua Cooper Date: 09/21/2024 Time: 60 mins.  DATA: Client reports that he is doing pretty good. He states that he feels like he is ready to move forward with the landlord. He reports that he is hoping to move into the room in the next few weeks. He states that he will be able to pay rent at that point. He reports that he is hoping that maybe we can provide a resource for his deposit. He states that work has been pretty steady and while he needs to continue to build that up he truly thinks that he can do it.    ASSESSMENT: Client presented with confidence, while also somewhat reserved. He appears to have lined everything up in a way that he feels like he can move in and be successful. He does not want to allow himself to get excited until it happens and understandably so. This is a huge milestone for the client, he has been homeless for three years so once he is in his new normal is going to take some time getting adjusted.     PLAN: Continued weekly therapy sessions with a goal of consistently attending 4 times per month. Client will practice mindfulness daily and box breathing when needed with a goal of calming his body, mind, and spirit. Client will continue with daily routine honoring to the best of his ability given his circumstances 5 days per week. Client will continue to apply for jobs to be able to save money.

## 2024-11-29 NOTE — Progress Notes (Signed)
 DAP note Client name: Joshua Cooper  Therapist name: Camie Norris Joshua Cooper, Joshua Cooper Date: 10/01/2024 Time: 60 mins.  DATA: Client reports that he is okay. He states that he has decided that he will move into the same room with a friend. He states that he knows that this will be close quarters but that they do get along well and most importantly it guarantees that he can afford the rent. He states that it does not have to be forever and he feels more comfortable with this plan. **[employer] is going to be paying his deposit of $150 so that he can make his entire first month rent.    ASSESSMENT: Client presented with ease. It was clear that this was going to make the client feel much more comfortable. This will allow him to be housed without the stress of the finances. This also allows him to pay rent with plasma alone so that he can continue to build his profession. He is still trying to maintain his excitement until he is officially housed but he is visibly getting more excited with the idea.    PLAN: Continued weekly therapy sessions with a goal of consistently attending 4 times per month. Client will practice mindfulness daily and box breathing when needed with a goal of calming his body, mind, and spirit. Client will continue with daily routine honoring to the best of his ability given his circumstances 5 days per week. Client will continue to apply for jobs to be able to continue to save money.

## 2024-11-29 NOTE — Progress Notes (Signed)
 DAP note Client name: Joshua Cooper  Therapist name: Camie Norris [name], [name] Date: 11/19/2024 Time: 60 mins.  DATA: Client reports that he is a little better. He states that the reason that he has not been to therapy in a month is because  he was also mentally adjusting to his new normal. He reports that between work and this new place it has just been a little stressful. He states that nothing is wrong he just does not want to loose it because he has worked so hard for all of it. He states that he just needs to get out of his own head. **LCSWA also had training on back to back weeks of his normal scheduled time.   ASSESSMENT: Client presented in a depressive state, despite working hard to get out of it. It seems that everything caught up to him and he needed to take some time to process it all. Showing up today means that he is working himself out of that space. He also continued to work during this time which shows his willingness and drive to not loose his new place. We discussed the importance of prioritizing his mental health, especially at this vulnerable time.    PLAN: Continued weekly therapy sessions with a goal of consistently attending 4 times per month. Client will practice mindfulness daily and box breathing when needed with a goal of calming his body, mind, and spirit. Client will continue with daily routine honoring to the best of his ability given his circumstances 5 days per week. Client will continue to apply for jobs to be able to continue to save money.

## 2024-11-29 NOTE — Progress Notes (Signed)
 DAP note Client name: Joshua Cooper  Therapist name: Camie Norris [name], [name] Date: 10/22/2024 Time: 60 mins.  DATA: Client reports that he is doing pretty good. He states that he is just trying to get used to the fact that he has a home. He reports that this is exciting but also a little scary. He states that the money is not really an issue as the roommate situation was the right way to go. He reports that it is more about the face that he just does not want to loose it. He states that he does not want to get stuck in that.    ASSESSMENT: Client presented as a calm presence. He has now had some time in his new place to begin to adjust. He is excited, happy, nervous, and a bit scared all at the same time. This has been a long time coming and it is imperative that he feels all the feelings while he works through each one. He has worked very hard for all of this, most importantly prioritizing his mental health so he must continue to do the same.      PLAN: Continued weekly therapy sessions with a goal of consistently attending 4 times per month. Client will practice mindfulness daily and box breathing when needed with a goal of calming his body, mind, and spirit. Client will continue with daily routine honoring to the best of his ability given his circumstances 5 days per week. Client will continue to apply for jobs to be able to continue to save money.

## 2024-12-10 ENCOUNTER — Other Ambulatory Visit: Payer: Self-pay | Admitting: Psychology

## 2024-12-17 ENCOUNTER — Other Ambulatory Visit: Payer: Self-pay | Admitting: Psychology

## 2024-12-28 ENCOUNTER — Emergency Department (HOSPITAL_COMMUNITY)
Admission: EM | Admit: 2024-12-28 | Discharge: 2024-12-28 | Discharge status: Against Medical Advice | Payer: MEDICAID | Attending: Emergency Medicine | Admitting: Emergency Medicine

## 2024-12-28 ENCOUNTER — Emergency Department (HOSPITAL_COMMUNITY): Payer: MEDICAID

## 2024-12-28 ENCOUNTER — Other Ambulatory Visit: Payer: Self-pay

## 2024-12-28 DIAGNOSIS — Z5321 Procedure and treatment not carried out due to patient leaving prior to being seen by health care provider: Secondary | ICD-10-CM | POA: Insufficient documentation

## 2024-12-28 DIAGNOSIS — R0781 Pleurodynia: Secondary | ICD-10-CM | POA: Insufficient documentation

## 2024-12-28 DIAGNOSIS — Y92410 Unspecified street and highway as the place of occurrence of the external cause: Secondary | ICD-10-CM | POA: Insufficient documentation

## 2024-12-28 DIAGNOSIS — M25552 Pain in left hip: Secondary | ICD-10-CM | POA: Diagnosis present

## 2024-12-28 MED ORDER — ACETAMINOPHEN 500 MG PO TABS
1000.0000 mg | ORAL_TABLET | Freq: Once | ORAL | Status: AC
  Administered 2024-12-28: 1000 mg via ORAL
  Filled 2024-12-28: qty 2

## 2024-12-28 MED ORDER — OXYCODONE HCL 5 MG PO TABS
5.0000 mg | ORAL_TABLET | Freq: Once | ORAL | Status: AC
  Administered 2024-12-28: 5 mg via ORAL
  Filled 2024-12-28: qty 1

## 2024-12-28 MED ORDER — LIDOCAINE 5 % EX PTCH
2.0000 | MEDICATED_PATCH | Freq: Once | CUTANEOUS | Status: DC
  Administered 2024-12-28: 2 via TRANSDERMAL
  Filled 2024-12-28: qty 2

## 2024-12-28 NOTE — ED Notes (Signed)
 Called patient x3 for vitals. Patient didn't respond.
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
## 2025-01-05 ENCOUNTER — Emergency Department (HOSPITAL_COMMUNITY): Payer: MEDICAID

## 2025-01-05 ENCOUNTER — Other Ambulatory Visit: Payer: Self-pay

## 2025-01-05 ENCOUNTER — Emergency Department (HOSPITAL_COMMUNITY)
Admission: EM | Admit: 2025-01-05 | Discharge: 2025-01-05 | Disposition: A | Discharge status: Home / Self Care | Payer: MEDICAID | Attending: Emergency Medicine | Admitting: Emergency Medicine

## 2025-01-05 ENCOUNTER — Encounter (HOSPITAL_COMMUNITY): Payer: Self-pay

## 2025-01-05 DIAGNOSIS — R1022 Pelvic and perineal pain left side: Secondary | ICD-10-CM | POA: Insufficient documentation

## 2025-01-05 DIAGNOSIS — R102 Pelvic and perineal pain unspecified side: Secondary | ICD-10-CM

## 2025-01-05 MED ORDER — IBUPROFEN 600 MG PO TABS: 600.0000 mg | ORAL_TABLET | Freq: Four times a day (QID) | ORAL | 0 refills | Status: AC | PRN

## 2025-01-05 MED ORDER — ACETAMINOPHEN 500 MG PO TABS
1000.0000 mg | ORAL_TABLET | Freq: Once | ORAL | Status: AC
  Administered 2025-01-05: 1000 mg via ORAL
  Filled 2025-01-05: qty 2

## 2025-01-05 MED ORDER — IBUPROFEN 200 MG PO TABS
600.0000 mg | ORAL_TABLET | Freq: Once | ORAL | Status: AC
  Administered 2025-01-05: 600 mg via ORAL
  Filled 2025-01-05: qty 3

## 2025-01-05 MED ORDER — METHOCARBAMOL 500 MG PO TABS: 500.0000 mg | ORAL_TABLET | Freq: Two times a day (BID) | ORAL | 0 refills | Status: AC | PRN
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
## 2025-01-05 NOTE — Discharge Instructions (Addendum)
 You were seen in the ER today for evaluation of your hip pain. Your CT scan showed that you had some arthritis.  I am referring you to a sports medicine provider for you to follow-up with.  Please make sure you call the schedule an appointment.  For pain, recommend taking 600mg  of ibuprofen  and/or 1000 mg of Tylenol  every 6 hours as needed for pain.  I am also prescribing you a few muscle relaxers to take as needed.  Please do not drive or operate any heavy machinery while on this medication as it will make you sleepy.  I have included some additional information into the paperwork for you to review.  If you have any concerns of any worsening symptoms, please return to nearest emerged apartment for evaluation.
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
## 2025-01-14 ENCOUNTER — Other Ambulatory Visit: Payer: Self-pay | Admitting: Psychology

## 2025-01-14 ENCOUNTER — Telehealth: Payer: Self-pay | Admitting: Internal Medicine

## 2025-01-14 NOTE — Telephone Encounter (Signed)
 Per SW patient needs a sooner appointment to be seen by Doctor.

## 2025-01-28 ENCOUNTER — Other Ambulatory Visit: Payer: Self-pay | Admitting: Psychology

## 2025-02-04 ENCOUNTER — Other Ambulatory Visit: Payer: Self-pay | Admitting: Psychology

## 2025-02-11 ENCOUNTER — Other Ambulatory Visit: Payer: Self-pay | Admitting: Psychology

## 2025-03-22 ENCOUNTER — Ambulatory Visit: Payer: Self-pay | Admitting: Internal Medicine
